# Patient Record
Sex: Female | Born: 1935 | Race: Black or African American | Hispanic: No | Marital: Married | Smoking: Never smoker
Health system: Southern US, Community
[De-identification: ages and names within clinical notes are randomized; demographics above are authoritative.]

## PROBLEM LIST (undated history)

## (undated) DIAGNOSIS — L259 Unspecified contact dermatitis, unspecified cause: Secondary | ICD-10-CM

## (undated) DIAGNOSIS — K222 Esophageal obstruction: Secondary | ICD-10-CM

## (undated) DIAGNOSIS — K5730 Diverticulosis of large intestine without perforation or abscess without bleeding: Secondary | ICD-10-CM

## (undated) DIAGNOSIS — E785 Hyperlipidemia, unspecified: Secondary | ICD-10-CM

## (undated) DIAGNOSIS — B351 Tinea unguium: Secondary | ICD-10-CM

## (undated) DIAGNOSIS — I10 Essential (primary) hypertension: Secondary | ICD-10-CM

## (undated) DIAGNOSIS — M109 Gout, unspecified: Secondary | ICD-10-CM

## (undated) DIAGNOSIS — Q613 Polycystic kidney, unspecified: Secondary | ICD-10-CM

## (undated) DIAGNOSIS — D126 Benign neoplasm of colon, unspecified: Secondary | ICD-10-CM

## (undated) DIAGNOSIS — K219 Gastro-esophageal reflux disease without esophagitis: Secondary | ICD-10-CM

## (undated) DIAGNOSIS — E559 Vitamin D deficiency, unspecified: Secondary | ICD-10-CM

## (undated) DIAGNOSIS — E538 Deficiency of other specified B group vitamins: Secondary | ICD-10-CM

## (undated) DIAGNOSIS — Z87442 Personal history of urinary calculi: Secondary | ICD-10-CM

## (undated) DIAGNOSIS — K449 Diaphragmatic hernia without obstruction or gangrene: Secondary | ICD-10-CM

## (undated) HISTORY — DX: Essential (primary) hypertension: I10

## (undated) HISTORY — DX: Diaphragmatic hernia without obstruction or gangrene: K44.9

## (undated) HISTORY — DX: Benign neoplasm of colon, unspecified: D12.6

## (undated) HISTORY — DX: Hyperlipidemia, unspecified: E78.5

## (undated) HISTORY — DX: Tinea unguium: B35.1

## (undated) HISTORY — PX: TONSILLECTOMY: SUR1361

## (undated) HISTORY — DX: Polycystic kidney, unspecified: Q61.3

## (undated) HISTORY — DX: Vitamin D deficiency, unspecified: E55.9

## (undated) HISTORY — DX: Esophageal obstruction: K22.2

## (undated) HISTORY — PX: COLONOSCOPY: SHX174

## (undated) HISTORY — DX: Gastro-esophageal reflux disease without esophagitis: K21.9

## (undated) HISTORY — DX: Deficiency of other specified B group vitamins: E53.8

## (undated) HISTORY — DX: Personal history of urinary calculi: Z87.442

## (undated) HISTORY — DX: Diverticulosis of large intestine without perforation or abscess without bleeding: K57.30

## (undated) HISTORY — DX: Unspecified contact dermatitis, unspecified cause: L25.9

---

## 1975-08-24 HISTORY — PX: DILATION AND CURETTAGE OF UTERUS: SHX78

## 1998-10-09 ENCOUNTER — Encounter: Payer: Self-pay | Admitting: Emergency Medicine

## 1998-10-09 ENCOUNTER — Emergency Department (HOSPITAL_COMMUNITY): Admission: EM | Admit: 1998-10-09 | Discharge: 1998-10-09 | Payer: Self-pay | Admitting: Emergency Medicine

## 1998-12-15 ENCOUNTER — Other Ambulatory Visit: Admission: RE | Admit: 1998-12-15 | Discharge: 1998-12-15 | Payer: Self-pay | Admitting: Obstetrics and Gynecology

## 1999-12-18 ENCOUNTER — Other Ambulatory Visit: Admission: RE | Admit: 1999-12-18 | Discharge: 1999-12-18 | Payer: Self-pay | Admitting: Obstetrics and Gynecology

## 2001-01-23 ENCOUNTER — Encounter: Payer: Self-pay | Admitting: Internal Medicine

## 2001-01-23 LAB — CONVERTED CEMR LAB

## 2001-02-21 ENCOUNTER — Other Ambulatory Visit: Admission: RE | Admit: 2001-02-21 | Discharge: 2001-02-21 | Payer: Self-pay | Admitting: Obstetrics and Gynecology

## 2001-08-07 ENCOUNTER — Encounter: Payer: Self-pay | Admitting: Internal Medicine

## 2002-04-03 ENCOUNTER — Other Ambulatory Visit: Admission: RE | Admit: 2002-04-03 | Discharge: 2002-04-03 | Payer: Self-pay | Admitting: Internal Medicine

## 2002-04-13 ENCOUNTER — Encounter: Payer: Self-pay | Admitting: Internal Medicine

## 2002-04-13 ENCOUNTER — Encounter: Admission: RE | Admit: 2002-04-13 | Discharge: 2002-04-13 | Payer: Self-pay | Admitting: Internal Medicine

## 2002-12-25 ENCOUNTER — Other Ambulatory Visit: Admission: RE | Admit: 2002-12-25 | Discharge: 2002-12-25 | Payer: Self-pay | Admitting: Obstetrics and Gynecology

## 2004-06-09 ENCOUNTER — Encounter: Admission: RE | Admit: 2004-06-09 | Discharge: 2004-06-09 | Payer: Self-pay | Admitting: Internal Medicine

## 2004-07-24 ENCOUNTER — Ambulatory Visit: Payer: Self-pay | Admitting: Internal Medicine

## 2004-11-18 ENCOUNTER — Ambulatory Visit: Payer: Self-pay | Admitting: Internal Medicine

## 2004-11-25 ENCOUNTER — Ambulatory Visit: Payer: Self-pay | Admitting: Internal Medicine

## 2005-01-01 ENCOUNTER — Other Ambulatory Visit: Admission: RE | Admit: 2005-01-01 | Discharge: 2005-01-01 | Payer: Self-pay | Admitting: Obstetrics and Gynecology

## 2005-03-09 ENCOUNTER — Ambulatory Visit: Payer: Self-pay | Admitting: Internal Medicine

## 2005-06-02 ENCOUNTER — Ambulatory Visit: Payer: Self-pay | Admitting: Internal Medicine

## 2005-06-09 ENCOUNTER — Ambulatory Visit: Payer: Self-pay | Admitting: Internal Medicine

## 2005-08-06 ENCOUNTER — Encounter: Admission: RE | Admit: 2005-08-06 | Discharge: 2005-08-06 | Payer: Self-pay | Admitting: Internal Medicine

## 2005-09-13 ENCOUNTER — Ambulatory Visit: Payer: Self-pay | Admitting: Internal Medicine

## 2005-09-29 ENCOUNTER — Ambulatory Visit: Payer: Self-pay | Admitting: Internal Medicine

## 2006-01-11 ENCOUNTER — Ambulatory Visit: Payer: Self-pay | Admitting: Internal Medicine

## 2006-04-08 ENCOUNTER — Ambulatory Visit: Payer: Self-pay | Admitting: Internal Medicine

## 2006-04-19 ENCOUNTER — Ambulatory Visit: Payer: Self-pay | Admitting: Internal Medicine

## 2006-07-20 ENCOUNTER — Ambulatory Visit: Payer: Self-pay | Admitting: Internal Medicine

## 2006-09-09 ENCOUNTER — Encounter: Admission: RE | Admit: 2006-09-09 | Discharge: 2006-09-09 | Payer: Self-pay | Admitting: Obstetrics and Gynecology

## 2006-12-07 ENCOUNTER — Ambulatory Visit: Payer: Self-pay | Admitting: Internal Medicine

## 2006-12-07 LAB — CONVERTED CEMR LAB
Bilirubin Urine: NEGATIVE
Chloride: 103 meq/L (ref 96–112)
GFR calc Af Amer: 70 mL/min
GFR calc non Af Amer: 58 mL/min
Glucose, Bld: 98 mg/dL (ref 70–99)
Hemoglobin, Urine: NEGATIVE
Ketones, ur: NEGATIVE mg/dL
Potassium: 3.9 meq/L (ref 3.5–5.1)
Sodium: 142 meq/L (ref 135–145)
Urine Glucose: NEGATIVE mg/dL
pH: 6 (ref 5.0–8.0)

## 2006-12-13 ENCOUNTER — Ambulatory Visit: Payer: Self-pay | Admitting: Cardiology

## 2007-01-03 ENCOUNTER — Ambulatory Visit: Payer: Self-pay | Admitting: Internal Medicine

## 2007-01-13 ENCOUNTER — Ambulatory Visit: Payer: Self-pay

## 2007-02-10 ENCOUNTER — Ambulatory Visit: Payer: Self-pay | Admitting: Internal Medicine

## 2007-03-17 ENCOUNTER — Ambulatory Visit: Payer: Self-pay | Admitting: Internal Medicine

## 2007-03-17 LAB — CONVERTED CEMR LAB
ALT: 20 units/L (ref 0–35)
Amylase: 86 units/L (ref 27–131)
Basophils Absolute: 0 10*3/uL (ref 0.0–0.1)
Bilirubin, Direct: 0.1 mg/dL (ref 0.0–0.3)
Eosinophils Absolute: 0.2 10*3/uL (ref 0.0–0.6)
HCT: 38.4 % (ref 36.0–46.0)
MCHC: 35.6 g/dL (ref 30.0–36.0)
MCV: 91.3 fL (ref 78.0–100.0)
Neutrophils Relative %: 48.6 % (ref 43.0–77.0)
RBC: 4.21 M/uL (ref 3.87–5.11)

## 2007-03-20 ENCOUNTER — Encounter: Payer: Self-pay | Admitting: Internal Medicine

## 2007-03-20 DIAGNOSIS — K5730 Diverticulosis of large intestine without perforation or abscess without bleeding: Secondary | ICD-10-CM | POA: Insufficient documentation

## 2007-03-20 DIAGNOSIS — E785 Hyperlipidemia, unspecified: Secondary | ICD-10-CM

## 2007-03-20 DIAGNOSIS — I10 Essential (primary) hypertension: Secondary | ICD-10-CM

## 2007-03-20 DIAGNOSIS — E559 Vitamin D deficiency, unspecified: Secondary | ICD-10-CM

## 2007-03-21 ENCOUNTER — Ambulatory Visit: Payer: Self-pay | Admitting: Gastroenterology

## 2007-04-13 ENCOUNTER — Ambulatory Visit: Payer: Self-pay | Admitting: Internal Medicine

## 2007-06-21 ENCOUNTER — Ambulatory Visit: Payer: Self-pay | Admitting: Internal Medicine

## 2007-08-09 ENCOUNTER — Ambulatory Visit: Payer: Self-pay | Admitting: Internal Medicine

## 2007-08-09 ENCOUNTER — Encounter: Payer: Self-pay | Admitting: Internal Medicine

## 2007-08-09 DIAGNOSIS — K219 Gastro-esophageal reflux disease without esophagitis: Secondary | ICD-10-CM | POA: Insufficient documentation

## 2007-08-24 DIAGNOSIS — E538 Deficiency of other specified B group vitamins: Secondary | ICD-10-CM

## 2007-08-24 HISTORY — DX: Deficiency of other specified B group vitamins: E53.8

## 2007-09-22 ENCOUNTER — Ambulatory Visit: Payer: Self-pay | Admitting: Internal Medicine

## 2007-09-22 DIAGNOSIS — R209 Unspecified disturbances of skin sensation: Secondary | ICD-10-CM

## 2007-11-28 ENCOUNTER — Ambulatory Visit: Payer: Self-pay | Admitting: Internal Medicine

## 2007-11-28 LAB — CONVERTED CEMR LAB
CO2: 31 meq/L (ref 19–32)
Calcium: 9.6 mg/dL (ref 8.4–10.5)
GFR calc Af Amer: 63 mL/min
GFR calc non Af Amer: 52 mL/min
Sodium: 139 meq/L (ref 135–145)

## 2007-12-05 DIAGNOSIS — K222 Esophageal obstruction: Secondary | ICD-10-CM

## 2007-12-05 DIAGNOSIS — Z87442 Personal history of urinary calculi: Secondary | ICD-10-CM

## 2007-12-05 DIAGNOSIS — K449 Diaphragmatic hernia without obstruction or gangrene: Secondary | ICD-10-CM | POA: Insufficient documentation

## 2007-12-05 DIAGNOSIS — S82899A Other fracture of unspecified lower leg, initial encounter for closed fracture: Secondary | ICD-10-CM | POA: Insufficient documentation

## 2007-12-07 ENCOUNTER — Ambulatory Visit: Payer: Self-pay | Admitting: Internal Medicine

## 2008-04-11 ENCOUNTER — Ambulatory Visit: Payer: Self-pay | Admitting: Internal Medicine

## 2008-04-11 LAB — CONVERTED CEMR LAB
Calcium: 9.3 mg/dL (ref 8.4–10.5)
Creatinine, Ser: 1.3 mg/dL — ABNORMAL HIGH (ref 0.4–1.2)
Folate: 7.6 ng/mL
GFR calc Af Amer: 52 mL/min
GFR calc non Af Amer: 43 mL/min
Vitamin B-12: 255 pg/mL (ref 211–911)

## 2008-04-15 ENCOUNTER — Ambulatory Visit: Payer: Self-pay | Admitting: Internal Medicine

## 2008-04-15 DIAGNOSIS — E538 Deficiency of other specified B group vitamins: Secondary | ICD-10-CM

## 2008-06-11 ENCOUNTER — Encounter: Payer: Self-pay | Admitting: Internal Medicine

## 2008-06-21 ENCOUNTER — Ambulatory Visit: Payer: Self-pay | Admitting: Internal Medicine

## 2008-08-05 ENCOUNTER — Ambulatory Visit: Payer: Self-pay | Admitting: Internal Medicine

## 2008-08-05 LAB — CONVERTED CEMR LAB
ALT: 18 units/L (ref 0–35)
AST: 18 units/L (ref 0–37)
Albumin: 3.6 g/dL (ref 3.5–5.2)
BUN: 13 mg/dL (ref 6–23)
CO2: 31 meq/L (ref 19–32)
Calcium: 9.4 mg/dL (ref 8.4–10.5)
Chloride: 102 meq/L (ref 96–112)
Cholesterol: 180 mg/dL (ref 0–200)
Creatinine, Ser: 1.1 mg/dL (ref 0.4–1.2)
Glucose, Bld: 102 mg/dL — ABNORMAL HIGH (ref 70–99)
HDL: 62.7 mg/dL (ref >39.0)
Total CHOL/HDL Ratio: 2.9
Total Protein: 6.9 g/dL (ref 6.0–8.3)
Triglycerides: 85 mg/dL (ref 0–149)
Vitamin B-12: 325 pg/mL (ref 211–911)

## 2008-08-07 ENCOUNTER — Telehealth: Payer: Self-pay | Admitting: Internal Medicine

## 2008-08-08 ENCOUNTER — Encounter (INDEPENDENT_AMBULATORY_CARE_PROVIDER_SITE_OTHER): Payer: Self-pay | Admitting: *Deleted

## 2008-08-08 ENCOUNTER — Ambulatory Visit: Payer: Self-pay | Admitting: Internal Medicine

## 2008-08-27 ENCOUNTER — Ambulatory Visit: Payer: Self-pay | Admitting: Internal Medicine

## 2008-08-27 DIAGNOSIS — R05 Cough: Secondary | ICD-10-CM

## 2008-08-27 DIAGNOSIS — J189 Pneumonia, unspecified organism: Secondary | ICD-10-CM | POA: Insufficient documentation

## 2008-09-10 ENCOUNTER — Ambulatory Visit: Payer: Self-pay | Admitting: Internal Medicine

## 2008-09-10 DIAGNOSIS — R1319 Other dysphagia: Secondary | ICD-10-CM | POA: Insufficient documentation

## 2008-12-03 ENCOUNTER — Encounter: Admission: RE | Admit: 2008-12-03 | Discharge: 2008-12-03 | Payer: Self-pay | Admitting: Obstetrics and Gynecology

## 2008-12-06 ENCOUNTER — Ambulatory Visit: Payer: Self-pay | Admitting: Internal Medicine

## 2009-06-06 ENCOUNTER — Ambulatory Visit: Payer: Self-pay | Admitting: Internal Medicine

## 2009-06-06 LAB — CONVERTED CEMR LAB
Alkaline Phosphatase: 64 units/L (ref 39–117)
Bilirubin Urine: NEGATIVE
Bilirubin, Direct: 0.1 mg/dL (ref 0.0–0.3)
Calcium: 9.3 mg/dL (ref 8.4–10.5)
Creatinine, Ser: 1.1 mg/dL (ref 0.4–1.2)
GFR calc non Af Amer: 62.53 mL/min (ref >60)
HDL: 48.5 mg/dL (ref >39.00)
Ketones, ur: NEGATIVE mg/dL
Leukocytes, UA: NEGATIVE
Sodium: 139 meq/L (ref 135–145)
Specific Gravity, Urine: 1.015 (ref 1.000–1.030)
TSH: 1.65 microintl units/mL (ref 0.35–5.50)
Total CHOL/HDL Ratio: 4
Total Protein: 7 g/dL (ref 6.0–8.3)
Triglycerides: 176 mg/dL — ABNORMAL HIGH (ref 0.0–149.0)
Urine Glucose: NEGATIVE mg/dL
VLDL: 35.2 mg/dL (ref 0.0–40.0)
pH: 6 (ref 5.0–8.0)

## 2009-06-07 ENCOUNTER — Ambulatory Visit: Payer: Self-pay | Admitting: Family Medicine

## 2009-06-07 DIAGNOSIS — L259 Unspecified contact dermatitis, unspecified cause: Secondary | ICD-10-CM

## 2009-06-10 ENCOUNTER — Ambulatory Visit: Payer: Self-pay | Admitting: Internal Medicine

## 2009-06-10 DIAGNOSIS — B351 Tinea unguium: Secondary | ICD-10-CM | POA: Insufficient documentation

## 2009-07-11 ENCOUNTER — Ambulatory Visit: Payer: Self-pay | Admitting: Internal Medicine

## 2009-08-16 ENCOUNTER — Inpatient Hospital Stay (HOSPITAL_COMMUNITY): Admission: EM | Admit: 2009-08-16 | Discharge: 2009-08-17 | Payer: Self-pay | Admitting: Emergency Medicine

## 2009-11-06 ENCOUNTER — Ambulatory Visit: Payer: Self-pay | Admitting: Internal Medicine

## 2009-11-06 LAB — CONVERTED CEMR LAB
Alkaline Phosphatase: 53 units/L (ref 39–117)
BUN: 17 mg/dL (ref 6–23)
Bilirubin, Direct: 0.1 mg/dL (ref 0.0–0.3)
Chloride: 102 meq/L (ref 96–112)
Creatinine, Ser: 1.2 mg/dL (ref 0.4–1.2)
Glucose, Bld: 101 mg/dL — ABNORMAL HIGH (ref 70–99)
Ketones, ur: NEGATIVE mg/dL
LDL Cholesterol: 82 mg/dL (ref 0–99)
Potassium: 3.8 meq/L (ref 3.5–5.1)
Specific Gravity, Urine: 1.02 (ref 1.000–1.030)
Total Bilirubin: 0.7 mg/dL (ref 0.3–1.2)
Total CHOL/HDL Ratio: 3
Total Protein, Urine: NEGATIVE mg/dL
Urine Glucose: NEGATIVE mg/dL
Urobilinogen, UA: 0.2 (ref 0.0–1.0)
VLDL: 27.6 mg/dL (ref 0.0–40.0)
pH: 6 (ref 5.0–8.0)

## 2009-11-12 ENCOUNTER — Ambulatory Visit: Payer: Self-pay | Admitting: Internal Medicine

## 2009-11-13 ENCOUNTER — Encounter (INDEPENDENT_AMBULATORY_CARE_PROVIDER_SITE_OTHER): Payer: Self-pay | Admitting: *Deleted

## 2009-12-09 ENCOUNTER — Encounter (INDEPENDENT_AMBULATORY_CARE_PROVIDER_SITE_OTHER): Payer: Self-pay | Admitting: *Deleted

## 2009-12-11 ENCOUNTER — Ambulatory Visit: Payer: Self-pay | Admitting: Internal Medicine

## 2009-12-25 ENCOUNTER — Ambulatory Visit: Payer: Self-pay | Admitting: Internal Medicine

## 2009-12-25 LAB — HM COLONOSCOPY

## 2009-12-26 ENCOUNTER — Encounter: Payer: Self-pay | Admitting: Internal Medicine

## 2010-02-24 ENCOUNTER — Ambulatory Visit: Payer: Self-pay | Admitting: Internal Medicine

## 2010-02-24 DIAGNOSIS — T23019A Burn of unspecified degree of unspecified thumb (nail), initial encounter: Secondary | ICD-10-CM | POA: Insufficient documentation

## 2010-05-07 ENCOUNTER — Ambulatory Visit: Payer: Self-pay | Admitting: Internal Medicine

## 2010-05-07 LAB — CONVERTED CEMR LAB
CO2: 30 meq/L (ref 19–32)
Chloride: 105 meq/L (ref 96–112)
GFR calc non Af Amer: 68.05 mL/min (ref >60)
Glucose, Bld: 89 mg/dL (ref 70–99)
Potassium: 3.9 meq/L (ref 3.5–5.1)
Sodium: 141 meq/L (ref 135–145)

## 2010-05-14 ENCOUNTER — Ambulatory Visit: Payer: Self-pay | Admitting: Internal Medicine

## 2010-08-23 HISTORY — PX: TOE SURGERY: SHX1073

## 2010-09-14 ENCOUNTER — Emergency Department (HOSPITAL_COMMUNITY)
Admission: EM | Admit: 2010-09-14 | Discharge: 2010-09-14 | Payer: Self-pay | Source: Home / Self Care | Admitting: Emergency Medicine

## 2010-09-22 NOTE — Assessment & Plan Note (Signed)
Summary: 6 MO ROV /NWS  #   Vital Signs:  Patient profile:   77 year old female Height:      67 inches Weight:      218 pounds BMI:     34.27 Temp:     98.4 degrees F oral Pulse rate:   72 / minute Pulse rhythm:   regular Resp:     16 per minute BP sitting:   120 / 74  (left arm) Cuff size:   regular  Vitals Entered By: Lanier Prude, CMA(AAMA) (May 14, 2010 9:31 AM) CC: 6 mo f/u Is Patient Diabetic? No   Primary Care Provider:  Jacinta Shoe, MD  CC:  6 mo f/u.  History of Present Illness: The patient presents for a follow up of hypertension, GERD, hyperlipidemia   Preventive Screening-Counseling & Management  Alcohol-Tobacco     Smoking Status: never  Current Medications (verified): 1)  Lovastatin 40 Mg  Tabs (Lovastatin) .Marland Kitchen.. 1 Once Daily 2)  Triamterene-Hctz 37.5-25 Mg  Caps (Triamterene-Hctz) .... Once Daily 3)  Metoprolol Tartrate 50 Mg  Tabs (Metoprolol Tartrate) .Marland Kitchen.. 1 By Mouth Bid 4)  Vitamin D3 1000 Unit  Tabs (Cholecalciferol) .Marland Kitchen.. 1 By Mouth Daily 5)  Miralax   Powd (Polyethylene Glycol 3350) .... As Needed 6)  Vitamin B-12 Cr 1000 Mcg  Tbcr (Cyanocobalamin) .... Take 1/2 Tab By Mouth Daily 7)  Ranitidine Hcl 150 Mg Caps (Ranitidine Hcl) .Marland Kitchen.. 1 Bid 8)  Omeprazole 20 Mg Cpdr (Omeprazole) .... One By Mouth Daily 9)  Silver Sulfadiazine 1 % Crea (Silver Sulfadiazine) .... Apply To Affected Skin Two Times A Day As Needed  Allergies (verified): 1)  ! Sulfa 2)  ! Biaxin 3)  ! Penicillin  Past History:  Past Medical History: Last updated: 02/24/2010 Diverticulosis, colon Hyperlipidemia  Hypertension GERD w/strict Dr Marina Goodell Low Vit B12 level, borderline 2009 GYN Dr Henderson Cloud   Past Surgical History: Last updated: 12/07/2007 tonsillectomy D&C 1977  Social History: Last updated: 09/10/2008 Retired Married Never Smoked Regular exercise-no Alcohol Use - no  Review of Systems  The patient denies fever, chest pain, syncope, abdominal  pain, and melena.    Physical Exam  General:  alert, well-developed, overweight-appearing.   Ears:  R and L ears are normal, no deformities or erythema Nose:  no lesions Mouth:  no lesions Lungs:  normal respiratory effort, no intercostal retractions or use of accessory muscles; normal breath sounds bilaterally - no crackles and no wheezes.    Heart:  normal rate, regular rhythm, no murmur, and no rub. BLE without edema.  Abdomen:  S/NT Msk:  No deformity or scoliosis noted of thoracic or lumbar spine.   Extremities:  No clubbing, cyanosis, edema, or deformity noted with normal full range of motion of all joints.   Neurologic:  WNL Skin:  left thumb with erythema from [facility] distal involving extensor surface - blister from nail bed to PIP (intact) Psych:  Cognition and judgment appear intact. Alert and cooperative with normal attention span and concentration. No apparent delusions, illusions, hallucinations   Impression & Recommendations:  Problem # 1:  HYPERTENSION (ICD-401.9) Assessment Improved The labs were reviewed with the patient.  Her updated medication list for this problem includes:    Triamterene-hctz 37.5-25 Mg Caps (Triamterene-hctz) ..... Once daily    Metoprolol Tartrate 50 Mg Tabs (Metoprolol tartrate) .Marland Kitchen... 1 by mouth bid  BP today: 120/74 Prior BP: 138/80 (02/24/2010)  Labs Reviewed: K+: 3.9 (05/07/2010) Creat: : 1.0 (05/07/2010)   Chol:  167 (11/06/2009)   HDL: 57.00 (11/06/2009)   LDL: 82 (11/06/2009)   TG: 138.0 (11/06/2009)  Problem # 2:  B12 DEFICIENCY (ICD-266.2) Assessment: Unchanged On the regimen of medicine(s) reflected in the chart    Problem # 3:  DEFICIENCY, VITAMIN D NOS (ICD-268.9) Assessment: Unchanged On the regimen of medicine(s) reflected in the chart    Problem # 4:  HYPERLIPIDEMIA (ICD-272.4) Assessment: Unchanged  Her updated medication list for this problem includes:    Lovastatin 40 Mg Tabs (Lovastatin) .Marland Kitchen... 1 once daily  Complete  Medication List: 1)  Lovastatin 40 Mg Tabs (Lovastatin) .Marland Kitchen.. 1 once daily 2)  Triamterene-hctz 37.5-25 Mg Caps (Triamterene-hctz) .... Once daily 3)  Metoprolol Tartrate 50 Mg Tabs (Metoprolol tartrate) .Marland Kitchen.. 1 by mouth bid 4)  Vitamin D3 1000 Unit Tabs (Cholecalciferol) .Marland Kitchen.. 1 by mouth daily 5)  Miralax Powd (Polyethylene glycol 3350) .... As needed 6)  Vitamin B-12 Cr 1000 Mcg Tbcr (Cyanocobalamin) .... Take 1/2 tab by mouth daily 7)  Ranitidine Hcl 150 Mg Caps (Ranitidine hcl) .Marland Kitchen.. 1 bid 8)  Omeprazole 20 Mg Cpdr (Omeprazole) .... One by mouth daily 9)  Silver Sulfadiazine 1 % Crea (Silver sulfadiazine) .... Apply to affected skin two times a day as needed  Patient Instructions: 1)  Please schedule a follow-up appointment in 6 months well w/labs. 2)  Vit B12 266.20 3)  Vit d 268.9 Prescriptions: OMEPRAZOLE 20 MG CPDR (OMEPRAZOLE) one by mouth daily  #90 x 3   Entered and Authorized by:   Tresa Garter MD   Signed by:   Tresa Garter MD on 05/14/2010   Method used:   Print then Give to Patient   RxID:   [id number] RANITIDINE HCL 150 MG CAPS (RANITIDINE HCL) 1 bid  #180 x 3   Entered and Authorized by:   Tresa Garter MD   Signed by:   Tresa Garter MD on 05/14/2010   Method used:   Print then Give to Patient   RxID:   [id number] METOPROLOL TARTRATE 50 MG  TABS (METOPROLOL TARTRATE) 1 by mouth bid  #180 x 3   Entered and Authorized by:   Tresa Garter MD   Signed by:   Tresa Garter MD on 05/14/2010   Method used:   Print then Give to Patient   RxID:   [id number] TRIAMTERENE-HCTZ 37.5-25 MG  CAPS (TRIAMTERENE-HCTZ) once daily  #90 x 3   Entered and Authorized by:   Tresa Garter MD   Signed by:   Tresa Garter MD on 05/14/2010   Method used:   Print then Give to Patient   RxID:   [id number] LOVASTATIN 40 MG  TABS (LOVASTATIN) 1 once daily  #90 x 3   Entered and Authorized by:   Tresa Garter MD    Signed by:   Tresa Garter MD on 05/14/2010   Method used:   Print then Give to Patient   RxID:   [id number]

## 2010-09-22 NOTE — Miscellaneous (Signed)
Summary: Doctor, general practice [facility]   Imported By: Lester Whitewater 11/20/2009 11:56:10  _____________________________________________________________________  External Attachment:    Type:   Image     Comment:   External Document

## 2010-09-22 NOTE — Letter (Signed)
Summary: Previsit letter  [facility]  [address] [place], [place] [address]   Phone: [phone]  Fax: [phone]       11/13/2009 MRN: [id number]  Jasmin [name] [address] [place], [place]  [address]  Dear Ms. Armendariz,  Welcome to the Gastroenterology Division at [facility].    You are scheduled to see a nurse for your pre-procedure visit on 12-11-09 at 10:30a.m. on the 3rd floor at [facility], [address]  We ask that you try to arrive at our office 15 minutes prior to your appointment time to allow for check-in.  Your nurse visit will consist of discussing your medical and surgical history, your immediate family medical history, and your medications.    Please bring a complete list of all your medications or, if you prefer, bring the medication bottles and we will list them.  We will need to be aware of both prescribed and over the counter drugs.  We will need to know exact dosage information as well.  If you are on blood thinners (Coumadin, Plavix, Aggrenox, Ticlid, etc.) please call our office today/prior to your appointment, as we need to consult with your physician about holding your medication.   Please be prepared to read and sign documents such as consent forms, a financial agreement, and acknowledgement forms.  If necessary, and with your consent, a friend or relative is welcome to sit-in on the nurse visit with you.  Please bring your insurance card so that we may make a copy of it.  If your insurance requires a referral to see a specialist, please bring your referral form from your primary care physician.  No co-pay is required for this nurse visit.     If you cannot keep your appointment, please call [phone] to cancel or reschedule prior to your appointment date.  This allows [place] the opportunity to schedule an appointment for another patient in need of care.    Thank you for choosing [facility] for your medical  needs.  We appreciate the opportunity to care for you.  Please visit [place] at our website  to learn more about our practice.                     Sincerely.                                                                                                                   The Gastroenterology Division

## 2010-09-22 NOTE — Letter (Signed)
Summary: Patient Notice- Polyp Results  [facility]  [address] [place], [place] [address]   Phone: [phone]  Fax: [phone]        Dec 26, 2009 MRN: [id number]    Eunice GIANINO [address] [place], [place]  [address]    Dear Ms. Behrman,  I am pleased to inform you that the colon polyp(s) removed during your recent colonoscopy was (were) found to be benign (no cancer detected) upon pathologic examination.  I recommend you have a repeat colonoscopy examination in ONE years to look for recurrent polyps, as having colon polyps increases your risk for having recurrent polyps or even colon cancer in the future.  Should you develop new or worsening symptoms of abdominal pain, bowel habit changes or bleeding from the rectum or bowels, please schedule an evaluation with either your primary care physician or with me.  Additional information/recommendations:  __ No further action with gastroenterology is needed at this time. Please      follow-up with your primary care physician for your other healthcare      needs.   Please call us if you are having persistent problems or have questions about your condition that have not been fully answered at this time.  Sincerely,  Hilarie Fredrickson MD  This letter has been electronically signed by your physician.  Appended Document: Patient Notice- Polyp Results letter mailed 4.9.11.
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
## 2010-09-22 NOTE — Procedures (Signed)
Summary: Colonoscopy  Patient: Jasmin [name] Note: All result statuses are Final unless otherwise noted.  Tests: (1) Colonoscopy (COL)   COL Colonoscopy           DONE     [facility]     [address]     [place], [place]  [address]           COLONOSCOPY PROCEDURE REPORT           PATIENT:  [name], [name]  MR#:  [id number]     BIRTHDATE:  08/05/1934, 76 yrs. old  GENDER:  female     ENDOSCOPIST:  Wilhemina Bonito. Eda Keys, MD     REF. BY:  Dr. Posey Rea ; Screening Recall     PROCEDURE DATE:  12/25/2009     PROCEDURE:  Colonoscopy with snare polypectomy x 10     ASA CLASS:  Class II     INDICATIONS:  Routine Risk Screening ; normal index exam 2002     MEDICATIONS:   Fentanyl 75 mcg IV, Versed 8 mg IV           DESCRIPTION OF PROCEDURE:   After the risks benefits and     alternatives of the procedure were thoroughly explained, informed     consent was obtained.  Digital rectal exam was performed and     revealed no abnormalities.   The LB [id number] endoscope     was introduced through the anus and advanced to the cecum, which     was identified by both the appendix and ileocecal valve, without     limitations.Time to cecum = 2:15 min. The quality of the prep was     excellent, using MoviPrep.  The instrument was then slowly     withdrawn (time = 15:58 min) as the colon was fully examined.     <<PROCEDUREIMAGES>>           FINDINGS:  There were multiple polyps identified and removed in     the cecum (3mm), ascending colon (1,2,3,4,5,5,44mm) and transverse     colon (32mm,4mm). Polyps were snared without cautery. Retrieval was     successful.  Moderate diverticulosis was found in the left colon.     This was otherwise a normal examination of the colon.   Retroflexed     views in the rectum revealed internal hemorrhoids.    The scope     was then withdrawn from the patient and the procedure completed.           COMPLICATIONS:  None     ENDOSCOPIC IMPRESSION:  1) Polyps, multiple (10) -removed     2) Moderate diverticulosis in the left colon     3) Otherwise normal examination     4) Internal hemorrhoids           RECOMMENDATIONS:     1) Follow up colonoscopy in ONE year           ______________________________     Wilhemina Bonito. Eda Keys, MD           CC:  Linda Hedges. Plotnikov, MD; The Patient           n.     eSIGNED:   Wilhemina Bonito. Eda Keys at 12/25/2009 10:56 AM           Marcello Fennel, [id number]  Note: An exclamation mark (!) indicates a result that was not dispersed into the flowsheet. Document Creation Date: 12/25/2009 10:57 AM _______________________________________________________________________  (  1) Order result status: Final Collection or observation date-time: 12/25/2009 10:44 Requested date-time:  Receipt date-time:  Reported date-time:  Referring Physician:   Ordering Physician: Fransico Setters [phone]) Specimen Source:  Source: Launa Grill Order Number: [id number] Lab site:   Appended Document: Colonoscopy recall in ONE year     Procedures Next Due Date:    Colonoscopy: 12/2010

## 2010-09-22 NOTE — Assessment & Plan Note (Signed)
Summary: 4 MO ROV /NWS  #   Vital Signs:  Patient profile:   77 year old female Weight:      221 pounds BMI:     34.74 Temp:     97.6 degrees F oral Pulse rate:   68 / minute BP sitting:   124 / 80  (left arm)  Vitals Entered By: Tora Perches (November 12, 2009 7:53 AM) CC: f/u Is Patient Diabetic? No   Primary Care Provider:  Jacinta Shoe, MD  CC:  f/u.  History of Present Illness:  The patient presents for a follow up of hypertension, GERD, hyperlipidemia The patient presents for a wellness examination    Preventive Screening-Counseling & Management  Alcohol-Tobacco     Smoking Status: never  Current Medications (verified): 1)  Lovastatin 40 Mg  Tabs (Lovastatin) .Marland Kitchen.. 1 Once Daily 2)  Triamterene-Hctz 37.5-25 Mg  Caps (Triamterene-Hctz) .... Once Daily 3)  Metoprolol Tartrate 50 Mg  Tabs (Metoprolol Tartrate) .Marland Kitchen.. 1 By Mouth Bid 4)  Vitamin D3 1000 Unit  Tabs (Cholecalciferol) .Marland Kitchen.. 1 By Mouth Daily 5)  Miralax   Powd (Polyethylene Glycol 3350) .... As Needed 6)  Vitamin B-12 Cr 1000 Mcg  Tbcr (Cyanocobalamin) .... Take 1/2 Tab By Mouth Daily 7)  Ranitidine Hcl 150 Mg Caps (Ranitidine Hcl) .Marland Kitchen.. 1 Bid 8)  Omeprazole 20 Mg Cpdr (Omeprazole) .... One By Mouth Daily 9)  Clobetasol Propionate 0.05 % Crea (Clobetasol Propionate) .... Apply To Area Two Times A Day As Needed 10)  Penlac 8 % Soln (Ciclopirox) .... As Dirr Qd  Allergies: 1)  ! Sulfa 2)  ! Biaxin 3)  ! Penicillin  Past History:  Past Surgical History: Last updated: 12/07/2007 tonsillectomy D&C 1977  Family History: Last updated: 08/09/2007 Family History Hypertension  Social History: Last updated: 09/10/2008 Retired Married Never Smoked Regular exercise-no Alcohol Use - no  Past Medical History: Diverticulosis, colon Hyperlipidemia Hypertension GERD w/strict Dr Marina Goodell Low Vit B12 level, borderline 2009 GYN Dr Henderson Cloud  Review of Systems       The patient complains of weight gain.   The patient denies anorexia, fever, weight loss, vision loss, decreased hearing, hoarseness, chest pain, syncope, dyspnea on exertion, peripheral edema, prolonged cough, headaches, hemoptysis, abdominal pain, melena, hematochezia, hematuria, incontinence, genital sores, muscle weakness, suspicious skin lesions, transient blindness, difficulty walking, depression, unusual weight change, abnormal bleeding, enlarged lymph nodes, angioedema, breast masses, and testicular masses.    Physical Exam  General:  alert, cooperative, well-appearing Eyes:  PERRLA, no icterus. Ears:  R and L ears are normal, no deformities or erythema Nose:  no lesions Mouth:  no lesions Lungs:  CTA Heart:  RRR Abdomen:  S/NT Msk:  No deformity or scoliosis noted of thoracic or lumbar spine.   Extremities:  No clubbing, cyanosis, edema, or deformity noted with normal full range of motion of all joints.   Neurologic:  WNL Skin:  small 1-71mm paules with minimal erythema and fairly individual on left upper chest, bilat wrists. B big toenails w/onycho   Impression & Recommendations:  Problem # 1:  B12 DEFICIENCY (ICD-266.2) Assessment Improved On prescription therapy   Problem # 2:  GERD (ICD-530.81) Assessment: Unchanged  Her updated medication list for this problem includes:    Ranitidine Hcl 150 Mg Caps (Ranitidine hcl) .Marland Kitchen... 1 bid    Omeprazole 20 Mg Cpdr (Omeprazole) ..... One by mouth daily  Problem # 3:  HYPERTENSION (ICD-401.9) Assessment: Improved  Her updated medication list for this problem includes:  Triamterene-hctz 37.5-25 Mg Caps (Triamterene-hctz) ..... Once daily    Metoprolol Tartrate 50 Mg Tabs (Metoprolol tartrate) .Marland Kitchen... 1 by mouth bid  BP today: 124/80 Prior BP: 136/80 (07/11/2009)  Labs Reviewed: K+: 3.8 (11/06/2009) Creat: : 1.2 (11/06/2009)   Chol: 167 (11/06/2009)   HDL: 57.00 (11/06/2009)   LDL: 82 (11/06/2009)   TG: 138.0 (11/06/2009)  Problem # 4:  HYPERLIPIDEMIA  (ICD-272.4) Assessment: Improved  Her updated medication list for this problem includes:    Lovastatin 40 Mg Tabs (Lovastatin) .Marland Kitchen... 1 once daily  Problem # 5:  PHYSICAL EXAMINATION (ICD-V70.0) Assessment: New  Health and age related issues were discussed. Available screening tests and vaccinations were discussed as well. Healthy life style including good diet and execise was discussed. The labs were reviewed with the patient. GYN q 12 months  Orders: First annual wellness visit with prevention plan  [phone]) Gastroenterology Referral (GI)  Complete Medication List: 1)  Lovastatin 40 Mg Tabs (Lovastatin) .Marland Kitchen.. 1 once daily 2)  Triamterene-hctz 37.5-25 Mg Caps (Triamterene-hctz) .... Once daily 3)  Metoprolol Tartrate 50 Mg Tabs (Metoprolol tartrate) .Marland Kitchen.. 1 by mouth bid 4)  Vitamin D3 1000 Unit Tabs (Cholecalciferol) .Marland Kitchen.. 1 by mouth daily 5)  Miralax Powd (Polyethylene glycol 3350) .... As needed 6)  Vitamin B-12 Cr 1000 Mcg Tbcr (Cyanocobalamin) .... Take 1/2 tab by mouth daily 7)  Ranitidine Hcl 150 Mg Caps (Ranitidine hcl) .Marland Kitchen.. 1 bid 8)  Omeprazole 20 Mg Cpdr (Omeprazole) .... One by mouth daily 9)  Clobetasol Propionate 0.05 % Crea (Clobetasol propionate) .... Apply to area two times a day as needed 10)  Penlac 8 % Soln (Ciclopirox) .... As dirr qd  Other Orders: Pneumococcal Vaccine (98119) Admin 1st Vaccine (14782) Admin 1st Vaccine [facility]) [phone])  Patient Instructions: 1)  Please schedule a follow-up appointment in 6 months. 2)  BMP prior to visit, ICD-9: 3)  TSH prior to visit, ICD-9:401.1   Pneumovax Vaccine    Vaccine Type: Pneumovax    Site: left deltoid    Mfr: Merck    Dose: 0.5 ml    Route: IM    Given by: Tora Perches    Exp. Date: 04/06/2011    Lot #: [id number]    VIS given: 03/20/96 version given November 12, 2009.

## 2010-09-22 NOTE — Miscellaneous (Signed)
Summary: [facility] previsit  Clinical Lists Changes  Medications: Added new medication of MOVIPREP 100 GM  SOLR (PEG-KCL-NACL-NASULF-NA ASC-C) As per prep instructions. - Signed Rx of MOVIPREP 100 GM  SOLR (PEG-KCL-NACL-NASULF-NA ASC-C) As per prep instructions.;  #1 x 0;  Signed;  Entered by: Karl Bales RN;  Authorized by: Hilarie Fredrickson MD;  Method used: Electronically to [facility] [address] [phone]*, [address] [place], [place], [place]  [id number], Ph: [phone] or [phone], Fax: [phone]    Prescriptions: MOVIPREP 100 GM  SOLR (PEG-KCL-NACL-NASULF-NA ASC-C) As per prep instructions.  #1 x 0   Entered by:   Karl Bales RN   Authorized by:   Hilarie Fredrickson MD   Signed by:   Karl Bales RN on 12/11/2009   Method used:   [facility] to        [facility]  [address] [phone]* (retail)       [address]       [place], [place]  [id number]       Ph: [phone] or [phone]       Fax: [phone]   RxID:   [id number]

## 2010-09-22 NOTE — Letter (Signed)
Summary: [facility] Instructions  [facility]  [address] [place], [place] [address]   Phone: [phone]  Fax: [phone]       Jasmin [name]    August 23, 1934    MRN: [id number]        Procedure Day /Date:  Thursday 12/25/2009     Arrival Time: 8:30 am      Procedure Time: 9:30 am     Location of Procedure:                    _ x_  [facility] (4th Floor)                        PREPARATION FOR COLONOSCOPY WITH MOVIPREP   Starting 5 days prior to your procedure Saturday 4/30 do not eat nuts, seeds, popcorn, corn, beans, peas,  salads, or any raw vegetables.  Do not take any fiber supplements (e.g. Metamucil, Citrucel, and Benefiber).  THE DAY BEFORE YOUR PROCEDURE         DATE: Wednesday 5/4  1.  Drink clear liquids the entire day-NO SOLID FOOD  2.  Do not drink anything colored red or purple.  Avoid juices with pulp.  No orange juice.  3.  Drink at least 64 oz. (8 glasses) of fluid/clear liquids during the day to prevent dehydration and help the prep work efficiently.  CLEAR LIQUIDS INCLUDE: Water Jello Ice Popsicles Tea (sugar ok, no milk/cream) Powdered fruit flavored drinks Coffee (sugar ok, no milk/cream) Gatorade Juice: apple, white grape, white cranberry  Lemonade Clear bullion, consomm, broth Carbonated beverages (any kind) Strained chicken noodle soup Hard Candy                             4.  In the morning, mix first dose of MoviPrep solution:    Empty 1 Pouch A and 1 Pouch B into the disposable container    Add lukewarm drinking water to the top line of the container. Mix to dissolve    Refrigerate (mixed solution should be used within 24 hrs)  5.  Begin drinking the prep at 5:00 p.m. The MoviPrep container is divided by 4 marks.   Every 15 minutes drink the solution down to the next mark (approximately 8 oz) until the full liter is complete.   6.  Follow completed prep with 16 oz of clear liquid of your choice  (Nothing red or purple).  Continue to drink clear liquids until bedtime.  7.  Before going to bed, mix second dose of MoviPrep solution:    Empty 1 Pouch A and 1 Pouch B into the disposable container    Add lukewarm drinking water to the top line of the container. Mix to dissolve    Refrigerate  THE DAY OF YOUR PROCEDURE      DATE: Thursday 5/5  Beginning at 4:30 a.m. (5 hours before procedure):         1. Every 15 minutes, drink the solution down to the next mark (approx 8 oz) until the full liter is complete.  2. Follow completed prep with 16 oz. of clear liquid of your choice.    3. You may drink clear liquids until 7:30 am (2 HOURS BEFORE PROCEDURE).   MEDICATION INSTRUCTIONS  Unless otherwise instructed, you should take regular prescription medications with a small sip of water   as early as possible the morning of  your procedure.  Hold Triamterene-HCTZ before coming in the day of your procedure (only hold on 12-25-09)       OTHER INSTRUCTIONS  You will need a responsible adult at least 75 years of age to accompany you and drive you home.   This person must remain in the waiting room during your procedure.  Wear loose fitting clothing that is easily removed.  Leave jewelry and other valuables at home.  However, you may wish to bring a book to read or  an iPod/MP3 player to listen to music as you wait for your procedure to start.  Remove all body piercing jewelry and leave at home.  Total time from sign-in until discharge is approximately 2-3 hours.  You should go home directly after your procedure and rest.  You can resume normal activities the  day after your procedure.  The day of your procedure you should not:   Drive   Make legal decisions   Operate machinery   Drink alcohol   Return to work  You will receive specific instructions about eating, activities and medications before you leave.    The above instructions have been reviewed and explained  to me by   Karl Bales RN  December 11, 2009 10:55 AM     I fully understand and can verbalize these instructions _____________________________ Date _________

## 2010-11-10 ENCOUNTER — Other Ambulatory Visit: Payer: Self-pay

## 2010-11-12 ENCOUNTER — Ambulatory Visit: Payer: Medicare Other

## 2010-11-12 DIAGNOSIS — E559 Vitamin D deficiency, unspecified: Secondary | ICD-10-CM

## 2010-11-12 DIAGNOSIS — E530 Riboflavin deficiency: Secondary | ICD-10-CM

## 2010-11-12 LAB — BASIC METABOLIC PANEL
Calcium: 9.2 mg/dL (ref 8.4–10.5)
GFR: 58.13 mL/min — ABNORMAL LOW (ref >60.00)
Glucose, Bld: 90 mg/dL (ref 70–99)
Potassium: 4.2 mEq/L (ref 3.5–5.1)
Sodium: 138 mEq/L (ref 135–145)

## 2010-11-12 LAB — URINALYSIS
Bilirubin Urine: NEGATIVE
Hgb urine dipstick: NEGATIVE
Ketones, ur: NEGATIVE
Nitrite: NEGATIVE
Total Protein, Urine: NEGATIVE

## 2010-11-12 LAB — CBC WITH DIFFERENTIAL/PLATELET
Basophils Relative: 0.5 % (ref 0.0–3.0)
Eosinophils Relative: 2.7 % (ref 0.0–5.0)
HCT: 39 % (ref 36.0–46.0)
Lymphs Abs: 1.5 10*3/uL (ref 0.7–4.0)
MCV: 93.9 fl (ref 78.0–100.0)
Monocytes Absolute: 0.3 10*3/uL (ref 0.1–1.0)
RBC: 4.15 Mil/uL (ref 3.87–5.11)
WBC: 3 10*3/uL — ABNORMAL LOW (ref 4.5–10.5)

## 2010-11-12 LAB — HEPATIC FUNCTION PANEL
ALT: 17 U/L (ref 0–35)
AST: 21 U/L (ref 0–37)
Albumin: 3.7 g/dL (ref 3.5–5.2)
Alkaline Phosphatase: 54 U/L (ref 39–117)
Total Bilirubin: 0.7 mg/dL (ref 0.3–1.2)

## 2010-11-12 LAB — LIPID PANEL
Total CHOL/HDL Ratio: 3
VLDL: 24.8 mg/dL (ref 0.0–40.0)

## 2010-11-12 LAB — VITAMIN B12: Vitamin B-12: 579 pg/mL (ref 211–911)

## 2010-11-17 ENCOUNTER — Encounter: Payer: Self-pay | Admitting: Internal Medicine

## 2010-11-17 ENCOUNTER — Ambulatory Visit (INDEPENDENT_AMBULATORY_CARE_PROVIDER_SITE_OTHER): Payer: Medicare Other | Admitting: Internal Medicine

## 2010-11-17 DIAGNOSIS — K219 Gastro-esophageal reflux disease without esophagitis: Secondary | ICD-10-CM

## 2010-11-17 DIAGNOSIS — Z0000 Encounter for general adult medical examination without abnormal findings: Secondary | ICD-10-CM

## 2010-11-17 DIAGNOSIS — I10 Essential (primary) hypertension: Secondary | ICD-10-CM

## 2010-11-17 DIAGNOSIS — E538 Deficiency of other specified B group vitamins: Secondary | ICD-10-CM

## 2010-11-17 NOTE — Patient Instructions (Signed)
Return in 6 months please

## 2010-11-17 NOTE — Assessment & Plan Note (Signed)
-   Controlled on 2 meds 

## 2010-11-17 NOTE — Assessment & Plan Note (Signed)
On meds

## 2010-11-17 NOTE — Progress Notes (Signed)
Subjective:    Patient ID: Jasmin [name], female    DOB: 1934/08/13, 77 y.o.   MRN: [id number]  HPI  The patient is here for a wellness exam. The patient has been doing well overall without major medical or psychological issues going on lately. The patient needs to address her chronic hypertension that has been well controlled with medicines,chronic  hyperlipidemia controlled with medicines as well and type 2 chronic diabetes, controlled with medical treatment. GYN care is up-to-date.  Review of Systems  Constitutional: Negative.  Negative for fever, chills, diaphoresis, activity change, appetite change, fatigue and unexpected weight change.  HENT: Negative for hearing loss, ear pain, nosebleeds, congestion, sore throat, facial swelling, rhinorrhea, sneezing, mouth sores, trouble swallowing, neck pain, neck stiffness, postnasal drip, sinus pressure and tinnitus.   Eyes: Negative for pain, discharge, redness, itching and visual disturbance.  Respiratory: Negative for cough, chest tightness, shortness of breath, wheezing and stridor.   Cardiovascular: Negative for chest pain, palpitations and leg swelling.  Gastrointestinal: Negative for nausea, diarrhea, constipation, blood in stool, abdominal distention, anal bleeding and rectal pain.       GERD symptoms  Genitourinary: Negative for dysuria, urgency, frequency, hematuria, flank pain, vaginal bleeding, vaginal discharge, difficulty urinating, genital sores and pelvic pain.  Musculoskeletal: Negative for back pain, joint swelling, arthralgias and gait problem.  Skin: Negative.  Negative for rash.  Neurological: Negative for dizziness, tremors, seizures, syncope, speech difficulty, weakness, numbness and headaches.  Hematological: Negative for adenopathy. Does not bruise/bleed easily.  Psychiatric/Behavioral: Negative for suicidal ideas, behavioral problems, sleep disturbance, dysphoric mood and decreased concentration. The patient is not  nervous/anxious.        Objective:   Physical Exam  Constitutional: She appears well-developed and well-nourished. No distress.  HENT:  Head: Normocephalic.  Right Ear: External ear normal.  Left Ear: External ear normal.  Nose: Nose normal.  Mouth/Throat: Oropharynx is clear and moist.  Eyes: Conjunctivae are normal. Pupils are equal, round, and reactive to light. Right eye exhibits no discharge. Left eye exhibits no discharge.  Neck: Normal range of motion. Neck supple. No JVD present. No tracheal deviation present. No thyromegaly present.  Cardiovascular: Normal rate, regular rhythm and normal heart sounds.   Pulmonary/Chest: No stridor. No respiratory distress. She has no wheezes.  Abdominal: Soft. Bowel sounds are normal. She exhibits no distension and no mass. There is no tenderness. There is no rebound and no guarding.  Musculoskeletal: She exhibits no edema and no tenderness.  Lymphadenopathy:    She has no cervical adenopathy.  Neurological: She displays normal reflexes. No cranial nerve deficit. She exhibits normal muscle tone. Coordination normal.  Skin: No rash noted. No erythema.  Psychiatric: She has a normal mood and affect. Her behavior is normal. Judgment and thought content normal.          Assessment & Plan:  Well adult exam The patient is here for annual Medicare wellness examination and management of other chronic and acute problems.  The risk factors are reflected in the social history. The roster all physicians providing medical care to patient - please, see in the Snapshot section of the chart.  Activities of daily living:  The patient is able 2 prepare food and peds date getting dressed using the toilet morning around from place to place without assistance.     Home safety :    The patient has smoke detectors and wears seatbelts. No firearms at home. No excess sun exposure.  Diet and exercise:  The patient  does exercise.  The need to exercise  regularly was discussed. Dietary issues discussed. Healthy diet discussed.      Depression screen :  Over the past 2 weeks, the patient has not felt down, depressed or hopeless. Over the past 2 weeks, the patient hasn't felt little interest or pleasure in doing things.      Past surgical history and problem list:  The following portions of the patient's history were reviewed and updated as appropriate:  allergies, current medications, past family history, past medical history,  past surgical history, past social history  and problem list.  Vision, hearing, body mass index were assessed and reviewed. Cognitive impairment assessment revealed cognition, memory and judgment appear normal.   During the course of the visit the patient was educated and counseled about appropriate screening and preventive services including : fall prevention , diabetes screening, nutrition counseling, vaccination.   GERD Controlled on 2 meds  HYPERTENSION Controlled. Cont meds  B12 DEFICIENCY On meds

## 2010-11-17 NOTE — Assessment & Plan Note (Signed)
Controlled  Con't meds 

## 2010-11-17 NOTE — Assessment & Plan Note (Signed)
The patient is here for annual Medicare wellness examination and management of other chronic and acute problems.  The risk factors are reflected in the social history. The roster all physicians providing medical care to patient - please, see in the Snapshot section of the chart.  Activities of daily living:  The patient is able 2 prepare food and peds date getting dressed using the toilet morning around from place to place without assistance.     Home safety :    The patient has smoke detectors and wears seatbelts. No firearms at home. No excess sun exposure.      Diet and exercise:  The patient  does exercise.  The need to exercise regularly was discussed. Dietary issues discussed. Healthy diet discussed.      Depression screen :  Over the past 2 weeks, the patient has not felt down, depressed or hopeless. Over the past 2 weeks, the patient hasn't felt little interest or pleasure in doing things.      Past surgical history and problem list:  The following portions of the patient's history were reviewed and updated as appropriate:  allergies, current medications, past family history, past medical history,  past surgical history, past social history  and problem list.  Vision, hearing, body mass index were assessed and reviewed. Cognitive impairment assessment revealed cognition, memory and judgment appear normal.   During the course of the visit the patient was educated and counseled about appropriate screening and preventive services including : fall prevention , diabetes screening, nutrition counseling, vaccination.  

## 2010-11-23 LAB — CBC
Hemoglobin: 14.3 g/dL (ref 12.0–15.0)
MCHC: 34.7 g/dL (ref 30.0–36.0)
RBC: 4.37 MIL/uL (ref 3.87–5.11)
WBC: 3 10*3/uL — ABNORMAL LOW (ref 4.0–10.5)

## 2010-11-23 LAB — URINE MICROSCOPIC-ADD ON

## 2010-11-23 LAB — COMPREHENSIVE METABOLIC PANEL
ALT: 22 U/L (ref 0–35)
AST: 27 U/L (ref 0–37)
CO2: 29 mEq/L (ref 19–32)
Chloride: 99 mEq/L (ref 96–112)
GFR calc Af Amer: 52 mL/min — ABNORMAL LOW (ref >60)
GFR calc non Af Amer: 43 mL/min — ABNORMAL LOW (ref >60)
Sodium: 137 mEq/L (ref 135–145)
Total Bilirubin: 0.6 mg/dL (ref 0.3–1.2)

## 2010-11-23 LAB — RAPID URINE DRUG SCREEN, HOSP PERFORMED
Barbiturates: NOT DETECTED
Opiates: NOT DETECTED
Tetrahydrocannabinol: NOT DETECTED

## 2010-11-23 LAB — CK TOTAL AND CKMB (NOT AT ARMC)
CK, MB: 2.5 ng/mL (ref 0.3–4.0)
Total CK: 123 U/L (ref 7–177)

## 2010-11-23 LAB — URINE CULTURE: Colony Count: >=100000

## 2010-11-23 LAB — TSH: TSH: 0.817 u[IU]/mL (ref 0.350–4.500)

## 2010-11-23 LAB — TROPONIN I
Troponin I: 0.01 ng/mL (ref 0.00–0.06)
Troponin I: <0.01 ng/mL (ref 0.00–0.06)

## 2010-11-23 LAB — URINALYSIS, ROUTINE W REFLEX MICROSCOPIC
Bilirubin Urine: NEGATIVE
Hgb urine dipstick: NEGATIVE
Nitrite: NEGATIVE
Specific Gravity, Urine: 1.009 (ref 1.005–1.030)
pH: 6.5 (ref 5.0–8.0)

## 2010-11-23 LAB — DIFFERENTIAL
Basophils Absolute: 0 10*3/uL (ref 0.0–0.1)
Eosinophils Absolute: 0 10*3/uL (ref 0.0–0.7)
Eosinophils Relative: 1 % (ref 0–5)

## 2010-11-23 LAB — APTT: aPTT: 26 seconds (ref 24–37)

## 2010-11-23 LAB — BRAIN NATRIURETIC PEPTIDE: Pro B Natriuretic peptide (BNP): <30 pg/mL (ref 0.0–100.0)

## 2010-11-23 LAB — LIPASE, BLOOD: Lipase: 29 U/L (ref 11–59)

## 2010-11-27 ENCOUNTER — Emergency Department (HOSPITAL_COMMUNITY)
Admission: EM | Admit: 2010-11-27 | Discharge: 2010-11-27 | Disposition: A | Discharge status: Home / Self Care | Payer: Medicare Other | Attending: Emergency Medicine | Admitting: Emergency Medicine

## 2010-11-27 DIAGNOSIS — K219 Gastro-esophageal reflux disease without esophagitis: Secondary | ICD-10-CM | POA: Insufficient documentation

## 2010-11-27 DIAGNOSIS — R071 Chest pain on breathing: Secondary | ICD-10-CM | POA: Insufficient documentation

## 2010-11-27 DIAGNOSIS — I10 Essential (primary) hypertension: Secondary | ICD-10-CM | POA: Insufficient documentation

## 2010-11-27 DIAGNOSIS — E7800 Pure hypercholesterolemia, unspecified: Secondary | ICD-10-CM | POA: Insufficient documentation

## 2010-11-27 LAB — URINALYSIS, ROUTINE W REFLEX MICROSCOPIC
Bilirubin Urine: NEGATIVE
Glucose, UA: NEGATIVE mg/dL
Hgb urine dipstick: NEGATIVE
Ketones, ur: NEGATIVE mg/dL
pH: 7 (ref 5.0–8.0)

## 2010-11-30 ENCOUNTER — Telehealth: Payer: Self-pay

## 2010-11-30 NOTE — Telephone Encounter (Signed)
[facility] Triage Call Report Triage Record Num: [id number] Operator: Di Kindle Patient Name: Jasmin [name] Call Date & Time: 11/27/2010 9:16:29AM Patient Phone: [phone] PCP: Sonda Primes Patient Gender: Female PCP Fax : [phone] Patient DOB: 30-Jul-1934 Practice Name: [facility] Reason for Call: Pt calling to report onset 11/26/10 of intermittent right side abd pain,lower abd, intense at times, tried MOM, no relief. Guideline: Abd Pain, with generalized pain, unable to carry out normal activities, advised of Need to be seen at ED/UC. Protocol(s) Used: Abdominal Pain Recommended Outcome per Protocol: See ED Immediately Reason for Outcome: Generalized abdominal pain that progresses to localized pain AND any of the following: loss of appetite, vomiting starting after pain, any fever, OR unable to carry out normal activities Care Advice: ~ Another adult should drive. ~ Pain medication or laxatives should not be taken until symptoms are evaluated. Call EMS 911 if signs and symptoms of shock develop (such as unable to stand due to faintness, dizziness, or lightheadedness; new onset of confusion; slow to respond or difficult to awaken; skin is pale, gray, cool, or moist to touch; severe weakness; loss of consciousness). ~ 11/27/2010 9:24:51AM Page 1 of 1 [id number]

## 2010-12-03 ENCOUNTER — Ambulatory Visit (INDEPENDENT_AMBULATORY_CARE_PROVIDER_SITE_OTHER): Payer: Medicare Other | Admitting: Internal Medicine

## 2010-12-03 ENCOUNTER — Encounter: Payer: Self-pay | Admitting: Internal Medicine

## 2010-12-03 ENCOUNTER — Other Ambulatory Visit (INDEPENDENT_AMBULATORY_CARE_PROVIDER_SITE_OTHER): Payer: Medicare Other

## 2010-12-03 VITALS — BP 140/90 | HR 68 | Temp 98.4°F | Resp 16 | Wt 213.0 lb

## 2010-12-03 DIAGNOSIS — R1031 Right lower quadrant pain: Secondary | ICD-10-CM

## 2010-12-03 LAB — COMPREHENSIVE METABOLIC PANEL
ALT: 20 U/L (ref 0–35)
Albumin: 4.2 g/dL (ref 3.5–5.2)
CO2: 30 mEq/L (ref 19–32)
GFR: 55.79 mL/min — ABNORMAL LOW (ref >60.00)
Glucose, Bld: 87 mg/dL (ref 70–99)
Potassium: 4 mEq/L (ref 3.5–5.1)
Sodium: 136 mEq/L (ref 135–145)
Total Protein: 7.6 g/dL (ref 6.0–8.3)

## 2010-12-03 LAB — URINALYSIS
Ketones, ur: NEGATIVE
Specific Gravity, Urine: 1.015 (ref 1.000–1.030)
Total Protein, Urine: NEGATIVE
Urine Glucose: NEGATIVE
pH: 6.5 (ref 5.0–8.0)

## 2010-12-03 LAB — CBC WITH DIFFERENTIAL/PLATELET
Basophils Absolute: 0 10*3/uL (ref 0.0–0.1)
Eosinophils Relative: 1.9 % (ref 0.0–5.0)
HCT: 41.4 % (ref 36.0–46.0)
Lymphs Abs: 1.9 10*3/uL (ref 0.7–4.0)
MCV: 93.2 fl (ref 78.0–100.0)
Monocytes Absolute: 0.3 10*3/uL (ref 0.1–1.0)
Monocytes Relative: 7.9 % (ref 3.0–12.0)
Neutrophils Relative %: 46.9 % (ref 43.0–77.0)
Platelets: 159 10*3/uL (ref 150.0–400.0)
RDW: 14.4 % (ref 11.5–14.6)
WBC: 4.4 10*3/uL — ABNORMAL LOW (ref 4.5–10.5)

## 2010-12-03 LAB — SEDIMENTATION RATE: Sed Rate: 15 mm/hr (ref 0–22)

## 2010-12-03 MED ORDER — CIPROFLOXACIN HCL 500 MG PO TABS: 500.0000 mg | ORAL_TABLET | Freq: Two times a day (BID) | ORAL | Status: DC

## 2010-12-03 NOTE — Assessment & Plan Note (Signed)
R/o appendicitis, diverticulitis etc Empiric Cipro Check CT Get labs

## 2010-12-03 NOTE — Progress Notes (Signed)
  Subjective:    Patient ID: Jasmin [name], female    DOB: 10-01-1933, 77 y.o.   MRN: [id number]  HPI  C/o sharp abd pain in her LLQ since last Thursday. She went to ER on Friday. Her urine was checked. She was given Vicodin for a "muscle strain"  Review of Systems  Constitutional: Negative for fever, chills, appetite change and unexpected weight change.  HENT: Negative for sinus pressure.   Respiratory: Negative for cough.   Cardiovascular: Negative for chest pain.  Gastrointestinal: Positive for abdominal pain (as above). Negative for nausea, vomiting, diarrhea, constipation, blood in stool, abdominal distention and anal bleeding.  Genitourinary: Negative for dysuria and urgency.  Musculoskeletal: Negative for back pain.  Skin: Negative for rash.  Neurological: Negative for weakness.       Objective:   Physical Exam  Constitutional: She appears well-developed.       Obese  HENT:  Mouth/Throat: No oropharyngeal exudate.  Eyes: Pupils are equal, round, and reactive to light. No scleral icterus.  Neck: No JVD present.  Cardiovascular: Normal rate and regular rhythm.  Exam reveals no gallop.   Pulmonary/Chest: She has no wheezes. She exhibits no tenderness.  Abdominal: She exhibits no distension and no mass. There is tenderness (RLQ is sensitve). There is no rebound and no guarding.  Musculoskeletal: She exhibits no edema.  Neurological: Coordination normal.  Skin: No rash noted.          Assessment & Plan:  RLQ abdominal pain R/o appendicitis, diverticulitis etc Empiric Cipro Check CT Get labs

## 2010-12-03 NOTE — Patient Instructions (Signed)
Go to ER if worse 

## 2010-12-04 ENCOUNTER — Ambulatory Visit (INDEPENDENT_AMBULATORY_CARE_PROVIDER_SITE_OTHER)
Admission: RE | Admit: 2010-12-04 | Discharge: 2010-12-04 | Disposition: A | Discharge status: Home / Self Care | Payer: Medicare Other | Source: Ambulatory Visit | Attending: Internal Medicine | Admitting: Internal Medicine

## 2010-12-04 ENCOUNTER — Telehealth: Payer: Self-pay | Admitting: Internal Medicine

## 2010-12-04 DIAGNOSIS — R1031 Right lower quadrant pain: Secondary | ICD-10-CM

## 2010-12-04 MED ORDER — IOHEXOL 300 MG/ML  SOLN
100.0000 mL | Freq: Once | INTRAMUSCULAR | Status: AC | PRN
  Administered 2010-12-04: 100 mL via INTRAVENOUS

## 2010-12-04 NOTE — Telephone Encounter (Signed)
Jasmin [name] , please, inform the patient:  the abd CT is OK. She can travel. Take Cipro.  Please, keep  next office visit appointment.   Thank you !

## 2010-12-07 NOTE — Telephone Encounter (Signed)
Left mess for patient to call back.  

## 2010-12-08 ENCOUNTER — Other Ambulatory Visit: Payer: Self-pay | Admitting: Internal Medicine

## 2010-12-08 NOTE — Telephone Encounter (Signed)
Called and advised pt's spouse of below.

## 2010-12-10 ENCOUNTER — Telehealth: Payer: Self-pay | Admitting: Internal Medicine

## 2010-12-10 NOTE — Telephone Encounter (Signed)
Pt's husband walked in office today....the patient is scheduled for tom 12-11-10 at 11:15am because last OV on 4-12 states due back in 10 days or go to ER if stmptoms worsen/change.

## 2010-12-10 NOTE — Telephone Encounter (Signed)
Caller states that Pt is still having RLQ pain that she was treated for. They have been out of town and are now on their way back home. Caller would like to know if Pt needs to come in for f/u OV.? Please advise.

## 2010-12-11 ENCOUNTER — Encounter: Payer: Self-pay | Admitting: Internal Medicine

## 2010-12-11 ENCOUNTER — Emergency Department (HOSPITAL_COMMUNITY): Payer: Medicare Other

## 2010-12-11 ENCOUNTER — Emergency Department (HOSPITAL_COMMUNITY)
Admission: EM | Admit: 2010-12-11 | Discharge: 2010-12-11 | Disposition: A | Discharge status: Home / Self Care | Payer: Medicare Other | Attending: Emergency Medicine | Admitting: Emergency Medicine

## 2010-12-11 ENCOUNTER — Ambulatory Visit: Payer: Medicare Other | Admitting: Internal Medicine

## 2010-12-11 ENCOUNTER — Ambulatory Visit (INDEPENDENT_AMBULATORY_CARE_PROVIDER_SITE_OTHER): Payer: Medicare Other | Admitting: Internal Medicine

## 2010-12-11 VITALS — BP 112/68 | HR 65 | Temp 98.1°F | Resp 14 | Wt 212.8 lb

## 2010-12-11 DIAGNOSIS — I10 Essential (primary) hypertension: Secondary | ICD-10-CM | POA: Insufficient documentation

## 2010-12-11 DIAGNOSIS — N281 Cyst of kidney, acquired: Secondary | ICD-10-CM | POA: Insufficient documentation

## 2010-12-11 DIAGNOSIS — K7689 Other specified diseases of liver: Secondary | ICD-10-CM | POA: Insufficient documentation

## 2010-12-11 DIAGNOSIS — K219 Gastro-esophageal reflux disease without esophagitis: Secondary | ICD-10-CM | POA: Insufficient documentation

## 2010-12-11 DIAGNOSIS — E7800 Pure hypercholesterolemia, unspecified: Secondary | ICD-10-CM | POA: Insufficient documentation

## 2010-12-11 DIAGNOSIS — R1011 Right upper quadrant pain: Secondary | ICD-10-CM | POA: Insufficient documentation

## 2010-12-11 DIAGNOSIS — R1031 Right lower quadrant pain: Secondary | ICD-10-CM

## 2010-12-11 LAB — URINALYSIS, ROUTINE W REFLEX MICROSCOPIC
Glucose, UA: NEGATIVE mg/dL
Ketones, ur: NEGATIVE mg/dL
Nitrite: NEGATIVE
Protein, ur: NEGATIVE mg/dL

## 2010-12-11 LAB — DIFFERENTIAL
Basophils Absolute: 0 10*3/uL (ref 0.0–0.1)
Basophils Relative: 0 % (ref 0–1)
Eosinophils Absolute: 0.1 10*3/uL (ref 0.0–0.7)
Eosinophils Relative: 2 % (ref 0–5)
Monocytes Absolute: 0.3 10*3/uL (ref 0.1–1.0)
Neutro Abs: 1.5 10*3/uL — ABNORMAL LOW (ref 1.7–7.7)

## 2010-12-11 LAB — COMPREHENSIVE METABOLIC PANEL
ALT: 17 U/L (ref 0–35)
AST: 19 U/L (ref 0–37)
Calcium: 9.1 mg/dL (ref 8.4–10.5)
GFR calc Af Amer: 56 mL/min — ABNORMAL LOW (ref >60)
Sodium: 142 mEq/L (ref 135–145)
Total Protein: 6.3 g/dL (ref 6.0–8.3)

## 2010-12-11 LAB — CBC
MCHC: 33.8 g/dL (ref 30.0–36.0)
Platelets: 139 10*3/uL — ABNORMAL LOW (ref 150–400)
RDW: 14.2 % (ref 11.5–15.5)

## 2010-12-11 NOTE — Progress Notes (Signed)
F/u abd pain - off and on. She got better after the CT scan. She had to go to ER this am 12/11/10. She had an [place].  Review of Systems  Constitutional: Negative for activity change, appetite change and fatigue.  HENT: Negative for ear pain, congestion, sneezing, trouble swallowing, neck pain and voice change.  Eyes: Negative for photophobia, pain, itching and visual disturbance.  Respiratory: Negative for cough, chest tightness and wheezing.  Cardiovascular: Negative for chest pain, palpitations and leg swelling.  Gastrointestinal:As above.  Negative for vomiting, blood in stool, abdominal distention and rectal pain.  Genitourinary: Negative for dysuria, hematuria, flank pain and genital sores.  Musculoskeletal: Negative for myalgias, back pain, joint swelling, arthralgias and gait problem.  Neurological: Negative for dizziness, syncope, weakness, light-headedness and headaches.  Hematological: Negative for adenopathy.  Psychiatric/Behavioral: Negative for suicidal ideas, behavioral problems, sleep disturbance and agitation  .  Exam:  NAD, obese  HEENT - moist  Lungs B CTA  CV: heart w/RRR  Abd s/nt - no HSM  A/o/c  MS, DTRs - all nl  LE - no edema B   A/P:  RLQ abdominal pain Recurrent. RUQ/RLQ  --- ? Colon spasm vs CBD stone vs ureter stone vs other. Use Vicodin prn. She has no h/o adhesions.   US Abdomen Complete (Final result)   Result time:12/11/10 [phone]    Final result by Rad Results In Interface (12/11/10 08:23:02)    Narrative:   *RADIOLOGY REPORT*  Clinical Data: Right upper quadrant pain  COMPLETE ABDOMINAL ULTRASOUND  Comparison: No prior ultrasound. There is correlation with CT of the abdomen 12/04/2010  Findings:  Gallbladder: No gallstones, gallbladder wall thickening, or pericholecystic fluid.  Common bile duct: 5 mm  Liver: Multiple simple hepatic cysts. The largest cyst is 4.4 x 3.5 x 5.5 cm. Slight increased echogenicity of the  liver parenchyma.  IVC: Normal  Pancreas: Head and body normal. Tail obscured by bowel gas.  Spleen: Normal  Right Kidney: 11.4 cm in sagittal length. Multiple cysts are noted.  Left Kidney: 10.7 cm in sagittal length. Multiple parapelvic cysts noted.  Abdominal aorta: Proximal and mid aorta normal. Distal aorta obscured by bowel gas.  IMPRESSION:  1. Gallbladder and biliary tree normal. 2. Numerous hepatic and bilateral renal cysts as noted on recent CT. 3. Tail of pancreas and distal aorta obscured by bowel gas.  Original Report Authenticated By: Bernerd Limbo, M.D.          Likely it is a functional pain - spasm(?) CT was OK. GI consult. Lab Results  Component Value Date   WBC 3.2* 12/11/2010   HGB 13.6 12/11/2010   HCT 40.2 12/11/2010   PLT 139* 12/11/2010   CHOL 186 11/12/2010   TRIG 124.0 11/12/2010   HDL 56.70 11/12/2010   ALT 17 12/11/2010   AST 19 12/11/2010   NA 142 12/11/2010   K 3.4* 12/11/2010   CL 105 12/11/2010   CREATININE 1.14 12/11/2010   BUN 17 12/11/2010   CO2 30 12/11/2010   TSH 1.59 11/12/2010   INR 1.16 08/16/2009

## 2010-12-11 NOTE — Assessment & Plan Note (Addendum)
Recurrent. RUQ/RLQ  --- ? Colon spasm vs CBD stone vs ureter stone vs other. Use Vicodin prn. She has no h/o adhesions.   US Abdomen Complete (Final result)   Result time:12/11/10 [phone]    Final result by Rad Results In Interface (12/11/10 08:23:02)    Narrative:   *RADIOLOGY REPORT*  Clinical Data: Right upper quadrant pain  COMPLETE ABDOMINAL ULTRASOUND  Comparison: No prior ultrasound. There is correlation with CT of the abdomen 12/04/2010  Findings:  Gallbladder: No gallstones, gallbladder wall thickening, or pericholecystic fluid.  Common bile duct: 5 mm  Liver: Multiple simple hepatic cysts. The largest cyst is 4.4 x 3.5 x 5.5 cm. Slight increased echogenicity of the liver parenchyma.  IVC: Normal  Pancreas: Head and body normal. Tail obscured by bowel gas.  Spleen: Normal  Right Kidney: 11.4 cm in sagittal length. Multiple cysts are noted.  Left Kidney: 10.7 cm in sagittal length. Multiple parapelvic cysts noted.  Abdominal aorta: Proximal and mid aorta normal. Distal aorta obscured by bowel gas.  IMPRESSION:  1. Gallbladder and biliary tree normal. 2. Numerous hepatic and bilateral renal cysts as noted on recent CT. 3. Tail of pancreas and distal aorta obscured by bowel gas.  Original Report Authenticated By: Bernerd Limbo, M.D.          ent functional pain - spasm(?) CT was OK

## 2010-12-11 NOTE — Patient Instructions (Signed)
You will follow up with Dr Marina Goodell.

## 2010-12-13 ENCOUNTER — Encounter: Payer: Self-pay | Admitting: Internal Medicine

## 2011-01-04 ENCOUNTER — Encounter: Payer: Self-pay | Admitting: Internal Medicine

## 2011-01-04 ENCOUNTER — Ambulatory Visit (INDEPENDENT_AMBULATORY_CARE_PROVIDER_SITE_OTHER): Payer: Medicare Other | Admitting: Internal Medicine

## 2011-01-04 VITALS — BP 132/80 | HR 68 | Ht 67.0 in | Wt 212.0 lb

## 2011-01-04 DIAGNOSIS — R1011 Right upper quadrant pain: Secondary | ICD-10-CM

## 2011-01-04 DIAGNOSIS — Z86010 Personal history of colonic polyps: Secondary | ICD-10-CM

## 2011-01-04 DIAGNOSIS — K219 Gastro-esophageal reflux disease without esophagitis: Secondary | ICD-10-CM

## 2011-01-04 DIAGNOSIS — Z1211 Encounter for screening for malignant neoplasm of colon: Secondary | ICD-10-CM

## 2011-01-04 MED ORDER — PEG-KCL-NACL-NASULF-NA ASC-C 100 G PO SOLR: 1.0000 | Freq: Once | ORAL | Status: AC

## 2011-01-04 NOTE — Progress Notes (Signed)
HISTORY OF PRESENT ILLNESS:  Jasmin [name] is a 77 y.o. female with multiple medical problems as listed below. She is followed in this clinic regarding GERD complicated by peptic stricture which has required esophageal dilation, and multiple adenomatous colon polyps. She presents today for evaluation of right upper quadrant abdominal pain. She reports 3 discrete episodes of pain occurring April 6, April 18, and 12/11/2010. The pain is described as sharp and lasting less than a minute. Possibly worse after meals or with certain movements, though quite unclear given the short duration. No other symptoms such as nausea, vomiting, fever, change in bowel or bladder habits. She does have chronic constipation and occasionally sees blood per rectum which she attributes to hemorrhoids. For the problem with acute pain, she sought an emergency room evaluation on 12/11/2010. CBC, comprehensive metabolic panel, lipase, and urinalysis were unrevealing. Abdominal ultrasound as well as CT scan of the abdomen and pelvis were performed. These were remarkable for simple hepatic and renal cysts. The largest hepatic cyst measured 5.5 x 4.4 x 3.5 cm. Which is in no recurrent pain in over 3 weeks. No active GERD symptoms. She is due for surveillance colonoscopy at this time.  REVIEW OF SYSTEMS:  All non-GI ROS negative except for back pain.  Past Medical History  Diagnosis Date  . Diverticulosis of colon   . Hyperlipidemia   . Hypertension   . GERD with stricture     Dr Marina Goodell  . Vitamin B12 deficiency 2009    Borderline  . Hemorrhoids   . Hiatal hernia   . Polycystic kidney   . Esophageal stricture   . Tubulovillous adenoma polyp of colon   . Onychomycosis   . Personal history of urinary calculi   . Contact dermatitis and other eczema, due to unspecified cause   . Unspecified vitamin D deficiency     Past Surgical History  Procedure Date  . Tonsillectomy   . Dilation and curettage of uterus 1977  .  Toe surgery     Right foot     Social History Jasmin [name]  reports that she has never smoked. She has never used smokeless tobacco. She reports that she does not drink alcohol or use illicit drugs.  family history is negative for Colon cancer.  Allergies  Allergen Reactions  . Clarithromycin   . Penicillins   . Sulfonamide Derivatives        PHYSICAL EXAMINATION: Vital signs: BP 132/80  Pulse 68  Ht 5\' 7"  (1.702 m)  Wt 212 lb (96.163 kg)  BMI 33.20 kg/m2  Constitutional: generally well-appearing, no acute distress Psychiatric: alert and oriented x3, cooperative Eyes: extraocular movements intact, anicteric, conjunctiva pink Mouth: oral pharynx moist, no lesions Neck: supple no lymphadenopathy Cardiovascular: heart regular rate and rhythm, no murmur Lungs: clear to auscultation bilaterally Abdomen: soft, nontender, nondistended, no obvious ascites, no peritoneal signs, normal bowel sounds, no organomegaly Rectal: Deferred until colonoscopy Extremities: no lower extremity edema bilaterally Skin: no lesions on visible extremities Neuro: No focal deficits. No asterixis.    ASSESSMENT:  #1. Fleeting sharp right upper quadrant/side pain. Likely musculoskeletal. Negative workup. No recurrence x3 weeks. No further workup indicated. #2. History of multiple adenomatous colon polyps. Due for followup #3. GERD with peptic stricture. Currently asymptomatic post dilation   PLAN:  #1. Continue PPI #2. Surveillance colonoscopy.The nature of the procedure, as well as the risks, benefits, and alternatives were carefully and thoroughly reviewed with the patient. Ample time for discussion and questions allowed.  The patient understood, was satisfied, and agreed to proceed. Movi prep prescribed. Patient instructed on its use

## 2011-01-04 NOTE — Patient Instructions (Signed)
Colon lec 01/07/11 2:30 pm arrive at 1:30 pm 4th floor Moviprep prescription has been sent to your pharmacy Colon brochure given to you to review.

## 2011-01-05 ENCOUNTER — Telehealth: Payer: Self-pay | Admitting: Internal Medicine

## 2011-01-05 NOTE — Assessment & Plan Note (Signed)
[facility]                         GASTROENTEROLOGY OFFICE NOTE   NAME:[name], [name]                  MRN:          [id number]  DATE:03/17/2007                            DOB:          Mar 19, 1934    REASON FOR CONSULTATION:  Abdominal pain.   HISTORY:  This is a 77 year old African-American female with a history  of hypertension and hyperlipidemia who is referred through the courtesy  of Dr. Posey [name] regarding epigastric pain. The patient has a history of  indigestion and heartburn. She was having trouble with chest pain. She  was treated with Prevacid. Her chest pain resolved. She subsequently  developed problems with epigastric and upper abdominal discomfort in a  band-like distribution. This has been occurring intermittently over the  past several months. It is not necessarily affected by meals. It  generally lasts about an hour. There is no radiation into the back. This  is not necessarily exacerbated by meals. This can awaken her at night.  She can have no episodes during the week or have two or three episodes  during the week. There is no associated nausea or vomiting. She denies a  prior history of similar problems. She has had no work up. She denies  any change in appetite or weight loss. No melena. She was having some  constipation after being placed on Prevacid, however this has resolved.  The patient's only other GI evaluation was a screening colonoscopy  performed in December 2002. This was normal except for mild sigmoid  diverticulosis. The patient does mention that when swallowing pills that  they feel like that there is some difficulty going down. She points to  the epigastric region.   PAST MEDICAL HISTORY:  1. Hypertension.  2. Hyperlipidemia.   PAST SURGICAL HISTORY:  None.   ALLERGIES:  1. SULFA.  2. BIAXIN.  3. PENICILLIN.   CURRENT MEDICATIONS:  1. Aspirin 325 mg daily.  2. Mevacor 40 mg nightly.  3. Vitamin  D.  4. Prevacid 30 mg daily.  5. Lovastatin 40 mg daily.  6. Triamterene/hydrochlorothiazide 37.5/25 mg daily.  7. Calcium supplement.  8. MiraLax p.r.n.   FAMILY HISTORY:  No family history of gastrointestinal malignancy.  Father with heart disease.   SOCIAL HISTORY:  The patient is married with two sons. She is  accompanied by her husband. She is a retired Games developer. She does not  smoke or use alcohol. She has a 12th grade education.   REVIEW OF SYSTEMS:  Per diagnostic evaluation form.   PHYSICAL EXAMINATION:  GENERAL:  Pleasant well appearing female in no  acute distress.  VITAL SIGNS:  Blood pressure 128/72, heart rate 80, weight 214.8 pounds.  Height 5 foot 7 inches.  HEENT:  Sclerae anicteric, conjunctiva are pink, oral mucosa is intact,  there is no adenopathy.  LUNGS:  Clear to auscultation.  HEART:  Regular without murmur.  ABDOMEN:  Obese and soft without tenderness, mass, or hernia. Good bowel  sounds are heard.  EXTREMITIES:  Without edema.   IMPRESSION:  1. Two month history of intermittent upper abdominal pain. Symptoms      are most  compatible with biliary colic.  2. Gastroesophageal reflux disease. Seemingly improved on Prevacid.      Questionable dysphagia. Rule out peptic stricture.  3. History of incidental diverticulosis. Otherwise, negative      colonoscopy December 2002.  4. General medical problems   RECOMMENDATIONS:  1. Continue Prevacid.  2. Laboratories today including CBC, comprehensive metabolic panel,      amylase and lipase.  3. Schedule abdominal ultrasound to rule out gallstones.  4. Schedule upper endoscopy to evaluate pain and possible dysphagia.     Jasmin Bonito. Marina Goodell, MD  Electronically Signed    JNP/MedQ  DD: 03/17/2007  DT: 03/18/2007  Job #: [id number]   cc:   Jasmin Quint. Plotnikov, MD

## 2011-01-05 NOTE — Telephone Encounter (Signed)
Pt cannot afford the copay for the moviprep. Pt informed that we have a sample and she can come to the office and pick it up. Pt verbalized understanding.

## 2011-01-05 NOTE — Assessment & Plan Note (Signed)
[facility]                         GASTROENTEROLOGY OFFICE NOTE   NAME:Pettingill, KLARYSSA FAUTH                  MRN:          [id number]  DATE:03/21/2007                            DOB:          31-Jul-1934    SESSION NUMBER:  [id number]   As per template.  Aorta normal.  Diameter 2.5 cm.   The IVC is patent.   The head and body of the pancreas appeared normal, but the tail is not  well-visualized.   Gallbladder normal.  Wall thickness 2.6 mm.   Common duct normal.  Diameter 5.4 mm.   The liver generally appears normal, except for multiple cysts noted.  The largest cyst measures 52 x 41 x 62 mm in the left lobe of the liver.   Kidneys:  Right 10 cm, left 11.5 cm.  There are multiple peripelvic  renal cysts noted.   Spleen:  Normal.  Length 7.3 cm.   ASSESSMENT:  1. No evidence of cholelithiasis or biliary ductal dilatation.  2. Incomplete pancreatic visualization.  3. Multiple liver cysts with the larger 5 x 6 cm cyst in the left      lobe.  4. Peripelvic right renal cysts noted.     Vania Rea. Jarold Motto, MD, Caleen Essex, FAGA  Electronically Signed    DRP/MedQ  DD: 03/21/2007  DT: 03/22/2007  Job #: [id number]   cc:   Tama Headings. Marina Goodell, M.D.

## 2011-01-07 ENCOUNTER — Encounter: Payer: Self-pay | Admitting: Internal Medicine

## 2011-01-07 ENCOUNTER — Ambulatory Visit (AMBULATORY_SURGERY_CENTER): Payer: Medicare Other | Admitting: Internal Medicine

## 2011-01-07 VITALS — BP 125/55 | HR 70 | Temp 97.4°F | Resp 18 | Ht 67.0 in | Wt 212.0 lb

## 2011-01-07 DIAGNOSIS — D126 Benign neoplasm of colon, unspecified: Secondary | ICD-10-CM

## 2011-01-07 DIAGNOSIS — K5730 Diverticulosis of large intestine without perforation or abscess without bleeding: Secondary | ICD-10-CM

## 2011-01-07 DIAGNOSIS — Z86010 Personal history of colonic polyps: Secondary | ICD-10-CM

## 2011-01-07 DIAGNOSIS — R1011 Right upper quadrant pain: Secondary | ICD-10-CM

## 2011-01-07 DIAGNOSIS — Z1211 Encounter for screening for malignant neoplasm of colon: Secondary | ICD-10-CM

## 2011-01-07 MED ORDER — SODIUM CHLORIDE 0.9 % IV SOLN: 500.0000 mL | INTRAVENOUS | Status: DC

## 2011-01-07 NOTE — Patient Instructions (Signed)
One small polyp was removed from your colon today.  Moderate diverticulosis was also seen.  You will receive a letter in the mail within 1-2 weeks with the polyp results.  Informational handouts were given to your care partner on polyps, diverticulosis and high fiber diet.  Repeat colonoscopy in 3 years 2015 per Dr. Marina Goodell.  Resume your prior medications today.  Please call if any questions or concerns.

## 2011-01-08 ENCOUNTER — Telehealth: Payer: Self-pay

## 2011-01-08 NOTE — Telephone Encounter (Signed)

## 2011-01-08 NOTE — Assessment & Plan Note (Signed)
[facility]                                 ON-CALL NOTE   MIJA, EFFERTZ                    MRN:          [id number]  DATE:01/14/2007                            DOB:          1934/06/20    Phone number [phone].  Phone call at 9:07 a.m. on Jan 14, 2007.   Ms. Annalee Genta was troubled with chest and abdominal pain, wound up going  to an Urgent Care several days ago, and wound up having a stress test at  [facility] yesterday.  She had not heard those results, but was sent home  without any signs of a problem.  She was diagnosed with gastroesophageal  reflux.  Told to take Zantac, and that seemed to be helping her, until  she held it for the stress test yesterday.  She woke up in the middle of  the night with the chest pain again at about 4 a.m., took the Zantac  then, but is still having some problems right now.  She is not having  trouble breathing, or other associated symptoms.   PLAN:  It sounds like she is having GI symptoms.  Obviously, there were  no critical findings on the stress test, or they would not have sent her  home.  So, I told her to start Prilosec instead of the Zantac, and to  start the 1st dose as soon as possible.  We will wait for the results of  the stress test, but did not sound like evaluation today was going to  add anything at this point.     Karie Schwalbe, MD  Electronically Signed    RIL/MedQ  DD: 01/14/2007  DT: 01/14/2007  Job #: [id number]   cc:   Georgina Quint. Plotnikov, MD

## 2011-02-01 ENCOUNTER — Other Ambulatory Visit: Payer: Self-pay | Admitting: Obstetrics and Gynecology

## 2011-02-01 DIAGNOSIS — Z1231 Encounter for screening mammogram for malignant neoplasm of breast: Secondary | ICD-10-CM

## 2011-02-11 ENCOUNTER — Ambulatory Visit
Admission: RE | Admit: 2011-02-11 | Discharge: 2011-02-11 | Disposition: A | Discharge status: Home / Self Care | Payer: Medicare Other | Source: Ambulatory Visit | Attending: Obstetrics and Gynecology | Admitting: Obstetrics and Gynecology

## 2011-02-11 DIAGNOSIS — Z1231 Encounter for screening mammogram for malignant neoplasm of breast: Secondary | ICD-10-CM

## 2011-02-15 ENCOUNTER — Other Ambulatory Visit: Payer: Self-pay | Admitting: Obstetrics and Gynecology

## 2011-02-15 DIAGNOSIS — R928 Other abnormal and inconclusive findings on diagnostic imaging of breast: Secondary | ICD-10-CM

## 2011-02-19 ENCOUNTER — Ambulatory Visit
Admission: RE | Admit: 2011-02-19 | Discharge: 2011-02-19 | Disposition: A | Discharge status: Home / Self Care | Payer: Medicare Other | Source: Ambulatory Visit | Attending: Obstetrics and Gynecology | Admitting: Obstetrics and Gynecology

## 2011-02-19 DIAGNOSIS — R928 Other abnormal and inconclusive findings on diagnostic imaging of breast: Secondary | ICD-10-CM

## 2011-05-20 ENCOUNTER — Encounter: Payer: Self-pay | Admitting: Internal Medicine

## 2011-05-20 ENCOUNTER — Other Ambulatory Visit (INDEPENDENT_AMBULATORY_CARE_PROVIDER_SITE_OTHER): Payer: Medicare Other

## 2011-05-20 ENCOUNTER — Ambulatory Visit (INDEPENDENT_AMBULATORY_CARE_PROVIDER_SITE_OTHER): Payer: Medicare Other | Admitting: Internal Medicine

## 2011-05-20 DIAGNOSIS — K649 Unspecified hemorrhoids: Secondary | ICD-10-CM

## 2011-05-20 DIAGNOSIS — I10 Essential (primary) hypertension: Secondary | ICD-10-CM

## 2011-05-20 DIAGNOSIS — E785 Hyperlipidemia, unspecified: Secondary | ICD-10-CM

## 2011-05-20 DIAGNOSIS — E538 Deficiency of other specified B group vitamins: Secondary | ICD-10-CM

## 2011-05-20 DIAGNOSIS — K648 Other hemorrhoids: Secondary | ICD-10-CM | POA: Insufficient documentation

## 2011-05-20 DIAGNOSIS — Z23 Encounter for immunization: Secondary | ICD-10-CM

## 2011-05-20 DIAGNOSIS — E559 Vitamin D deficiency, unspecified: Secondary | ICD-10-CM

## 2011-05-20 LAB — CBC WITH DIFFERENTIAL/PLATELET
Basophils Relative: 0.4 % (ref 0.0–3.0)
Eosinophils Relative: 2.1 % (ref 0.0–5.0)
HCT: 40.2 % (ref 36.0–46.0)
Hemoglobin: 13.7 g/dL (ref 12.0–15.0)
Lymphs Abs: 1.4 10*3/uL (ref 0.7–4.0)
MCV: 93.7 fl (ref 78.0–100.0)
Monocytes Absolute: 0.3 10*3/uL (ref 0.1–1.0)
Monocytes Relative: 10.4 % (ref 3.0–12.0)
Neutro Abs: 1 10*3/uL — ABNORMAL LOW (ref 1.4–7.7)
Platelets: 132 10*3/uL — ABNORMAL LOW (ref 150.0–400.0)
WBC: 2.7 10*3/uL — ABNORMAL LOW (ref 4.5–10.5)

## 2011-05-20 MED ORDER — HYDROCORTISONE ACETATE 25 MG RE SUPP: 25.0000 mg | Freq: Two times a day (BID) | RECTAL | Status: DC

## 2011-05-20 NOTE — Assessment & Plan Note (Signed)
Continue with current prescription therapy as reflected on the Med list.  

## 2011-05-20 NOTE — Assessment & Plan Note (Signed)
Anusol hc prn supp

## 2011-05-20 NOTE — Progress Notes (Signed)
  Subjective:    Patient ID: Jasmin [name], female    DOB: 07-19-1934, 77 y.o.   MRN: [id number]  HPI  The patient presents for a follow-up of  chronic hypertension, chronic dyslipidemia,  GERD, controlled with medicines. C/o occ rectal bleeding from hemorrhoids    Review of Systems  Constitutional: Negative for chills, activity change, appetite change, fatigue and unexpected weight change.  HENT: Negative for congestion, mouth sores and sinus pressure.   Eyes: Negative for visual disturbance.  Respiratory: Negative for cough and chest tightness.   Gastrointestinal: Positive for anal bleeding. Negative for nausea and abdominal pain.  Genitourinary: Negative for frequency, difficulty urinating and vaginal pain.  Musculoskeletal: Negative for back pain and gait problem.  Skin: Negative for pallor and rash.  Neurological: Negative for dizziness, tremors, weakness, numbness and headaches.  Psychiatric/Behavioral: Negative for confusion and sleep disturbance.       Objective:   Physical Exam  Constitutional: She appears well-developed. No distress.       obese  HENT:  Head: Normocephalic.  Right Ear: External ear normal.  Left Ear: External ear normal.  Nose: Nose normal.  Mouth/Throat: Oropharynx is clear and moist.  Eyes: Conjunctivae are normal. Pupils are equal, round, and reactive to light. Right eye exhibits no discharge. Left eye exhibits no discharge.  Neck: Normal range of motion. Neck supple. No JVD present. No tracheal deviation present. No thyromegaly present.  Cardiovascular: Normal rate, regular rhythm and normal heart sounds.   Pulmonary/Chest: No stridor. No respiratory distress. She has no wheezes.  Abdominal: Soft. Bowel sounds are normal. She exhibits no distension and no mass. There is no tenderness. There is no rebound and no guarding.  Musculoskeletal: She exhibits no edema and no tenderness.  Lymphadenopathy:    She has no cervical adenopathy.    Neurological: She displays normal reflexes. No cranial nerve deficit. She exhibits normal muscle tone. Coordination normal.  Skin: No rash noted. No erythema.  Psychiatric: She has a normal mood and affect. Her behavior is normal. Judgment and thought content normal.          Assessment & Plan:

## 2011-05-21 ENCOUNTER — Telehealth: Payer: Self-pay | Admitting: Internal Medicine

## 2011-05-21 LAB — COMPREHENSIVE METABOLIC PANEL
ALT: 17 U/L (ref 0–35)
Albumin: 4 g/dL (ref 3.5–5.2)
BUN: 18 mg/dL (ref 6–23)
CO2: 29 mEq/L (ref 19–32)
Calcium: 9 mg/dL (ref 8.4–10.5)
Chloride: 105 mEq/L (ref 96–112)
Creatinine, Ser: 1.1 mg/dL (ref 0.4–1.2)
GFR: 60.3 mL/min (ref >60.00)
Potassium: 4.1 mEq/L (ref 3.5–5.1)

## 2011-05-21 LAB — LIPID PANEL: HDL: 54.4 mg/dL (ref >39.00)

## 2011-05-21 LAB — VITAMIN D 25 HYDROXY (VIT D DEFICIENCY, FRACTURES): Vit D, 25-Hydroxy: 44 ng/mL (ref 30–89)

## 2011-05-21 NOTE — Telephone Encounter (Signed)
Pt's husband informed.

## 2011-05-21 NOTE — Telephone Encounter (Signed)
Jasmin [name], please, inform patient that all labs are normal except for some previous cbc abnormalities we've been watching Thx

## 2011-06-07 ENCOUNTER — Other Ambulatory Visit: Payer: Self-pay | Admitting: Internal Medicine

## 2011-09-07 ENCOUNTER — Other Ambulatory Visit: Payer: Self-pay | Admitting: *Deleted

## 2011-09-07 MED ORDER — LOVASTATIN 40 MG PO TABS: 40.0000 mg | ORAL_TABLET | Freq: Every day | ORAL | Status: DC

## 2011-09-07 MED ORDER — TRIAMTERENE-HCTZ 37.5-25 MG PO TABS: 1.0000 | ORAL_TABLET | Freq: Every day | ORAL | Status: DC

## 2011-09-07 MED ORDER — RANITIDINE HCL 150 MG PO CAPS: 150.0000 mg | ORAL_CAPSULE | Freq: Two times a day (BID) | ORAL | Status: DC

## 2011-09-07 MED ORDER — OMEPRAZOLE 20 MG PO CPDR: 20.0000 mg | DELAYED_RELEASE_CAPSULE | Freq: Every day | ORAL | Status: DC

## 2011-09-07 NOTE — Progress Notes (Signed)
Addended by: Merrilyn Puma on: 09/07/2011 11:31 AM   Modules accepted: Orders, Medications

## 2011-11-18 ENCOUNTER — Ambulatory Visit: Payer: Medicare Other | Admitting: Internal Medicine

## 2011-11-24 ENCOUNTER — Encounter: Payer: Self-pay | Admitting: Internal Medicine

## 2011-11-24 ENCOUNTER — Ambulatory Visit (INDEPENDENT_AMBULATORY_CARE_PROVIDER_SITE_OTHER): Payer: Medicare Other | Admitting: Internal Medicine

## 2011-11-24 ENCOUNTER — Other Ambulatory Visit (INDEPENDENT_AMBULATORY_CARE_PROVIDER_SITE_OTHER): Payer: Medicare Other

## 2011-11-24 VITALS — BP 122/72 | HR 56 | Temp 97.5°F | Ht 65.0 in | Wt 212.0 lb

## 2011-11-24 DIAGNOSIS — E559 Vitamin D deficiency, unspecified: Secondary | ICD-10-CM

## 2011-11-24 DIAGNOSIS — I10 Essential (primary) hypertension: Secondary | ICD-10-CM

## 2011-11-24 DIAGNOSIS — R413 Other amnesia: Secondary | ICD-10-CM

## 2011-11-24 DIAGNOSIS — E538 Deficiency of other specified B group vitamins: Secondary | ICD-10-CM

## 2011-11-24 LAB — VITAMIN B12: Vitamin B-12: 631 pg/mL (ref 211–911)

## 2011-11-24 LAB — BASIC METABOLIC PANEL
BUN: 22 mg/dL (ref 6–23)
Calcium: 9.5 mg/dL (ref 8.4–10.5)
Creatinine, Ser: 1 mg/dL (ref 0.4–1.2)
GFR: 71.81 mL/min (ref >60.00)
Glucose, Bld: 97 mg/dL (ref 70–99)

## 2011-11-24 LAB — URINALYSIS
Bilirubin Urine: NEGATIVE
Ketones, ur: NEGATIVE
Leukocytes, UA: NEGATIVE
Urobilinogen, UA: 0.2 (ref 0.0–1.0)

## 2011-11-24 LAB — LIPID PANEL
Cholesterol: 182 mg/dL (ref 0–200)
HDL: 55.2 mg/dL (ref >39.00)
VLDL: 21 mg/dL (ref 0.0–40.0)

## 2011-11-24 LAB — HEPATIC FUNCTION PANEL
Albumin: 3.9 g/dL (ref 3.5–5.2)
Alkaline Phosphatase: 51 U/L (ref 39–117)

## 2011-11-24 LAB — TSH: TSH: 1.19 u[IU]/mL (ref 0.35–5.50)

## 2011-11-24 NOTE — Assessment & Plan Note (Signed)
Continue with current prescription therapy as reflected on the Med list.  

## 2011-11-24 NOTE — Patient Instructions (Signed)
Hold lovastatin x  1 month

## 2011-11-24 NOTE — Assessment & Plan Note (Signed)
Hold lovastatin x 1 mo

## 2011-11-24 NOTE — Progress Notes (Signed)
Patient ID: Jasmin [name], female   DOB: 12-May-1934, 78 y.o.   MRN: [id number]  Subjective:    Patient ID: Jasmin [name], female    DOB: 1934-01-08, 78 y.o.   MRN: [id number]  HPI  The patient presents for a follow-up of  chronic hypertension, chronic dyslipidemia,  GERD, controlled with medicines. C/o occ memory issues lately    Review of Systems  Constitutional: Negative for chills, activity change, appetite change, fatigue and unexpected weight change.  HENT: Negative for congestion, mouth sores and sinus pressure.   Eyes: Negative for visual disturbance.  Respiratory: Negative for cough and chest tightness.   Gastrointestinal: Negative for nausea, abdominal pain and anal bleeding.  Genitourinary: Negative for frequency, difficulty urinating and vaginal pain.  Musculoskeletal: Negative for back pain and gait problem.  Skin: Negative for pallor and rash.  Neurological: Negative for dizziness, tremors, weakness, numbness and headaches.  Psychiatric/Behavioral: Negative for suicidal ideas, confusion and sleep disturbance. The patient is not nervous/anxious.        Objective:   Physical Exam  Constitutional: She appears well-developed. No distress.       obese  HENT:  Head: Normocephalic.  Right Ear: External ear normal.  Left Ear: External ear normal.  Nose: Nose normal.  Mouth/Throat: Oropharynx is clear and moist.  Eyes: Conjunctivae are normal. Pupils are equal, round, and reactive to light. Right eye exhibits no discharge. Left eye exhibits no discharge.  Neck: Normal range of motion. Neck supple. No JVD present. No tracheal deviation present. No thyromegaly present.  Cardiovascular: Normal rate, regular rhythm and normal heart sounds.   Pulmonary/Chest: No stridor. No respiratory distress. She has no wheezes.  Abdominal: Soft. Bowel sounds are normal. She exhibits no distension and no mass. There is no tenderness. There is no rebound and no guarding.    Musculoskeletal: She exhibits no edema and no tenderness.  Lymphadenopathy:    She has no cervical adenopathy.  Neurological: She displays normal reflexes. No cranial nerve deficit. She exhibits normal muscle tone. Coordination normal.  Skin: No rash noted. No erythema.  Psychiatric: She has a normal mood and affect. Her behavior is normal. Judgment and thought content normal.          Assessment & Plan:

## 2011-11-24 NOTE — Assessment & Plan Note (Signed)
Continue with current prescription therapy as reflected on the Med list. Check labs 

## 2011-11-29 ENCOUNTER — Telehealth: Payer: Self-pay | Admitting: *Deleted

## 2011-11-29 NOTE — Telephone Encounter (Signed)
Message copied by Merrilyn Puma on Mon Nov 29, 2011  5:08 PM ------      Message from: Janeal Holmes      Created: Wed Nov 24, 2011  3:45 PM       Misty Stanley, please, inform patient that all labs are OK      Thank you!

## 2011-11-29 NOTE — Telephone Encounter (Signed)
Called pt no answer/machine.   Will try again later.

## 2011-11-30 NOTE — Telephone Encounter (Signed)
Pt informed

## 2011-12-07 ENCOUNTER — Other Ambulatory Visit: Payer: Self-pay | Admitting: *Deleted

## 2011-12-07 MED ORDER — METOPROLOL TARTRATE 50 MG PO TABS: 50.0000 mg | ORAL_TABLET | Freq: Two times a day (BID) | ORAL | Status: DC

## 2012-03-06 ENCOUNTER — Other Ambulatory Visit: Payer: Self-pay | Admitting: Internal Medicine

## 2012-03-06 DIAGNOSIS — Z1231 Encounter for screening mammogram for malignant neoplasm of breast: Secondary | ICD-10-CM

## 2012-03-10 ENCOUNTER — Ambulatory Visit (INDEPENDENT_AMBULATORY_CARE_PROVIDER_SITE_OTHER): Payer: Medicare Other | Admitting: Internal Medicine

## 2012-03-10 ENCOUNTER — Telehealth: Payer: Self-pay | Admitting: Internal Medicine

## 2012-03-10 ENCOUNTER — Other Ambulatory Visit (INDEPENDENT_AMBULATORY_CARE_PROVIDER_SITE_OTHER): Payer: Medicare Other

## 2012-03-10 ENCOUNTER — Encounter: Payer: Self-pay | Admitting: Internal Medicine

## 2012-03-10 ENCOUNTER — Ambulatory Visit (INDEPENDENT_AMBULATORY_CARE_PROVIDER_SITE_OTHER)
Admission: RE | Admit: 2012-03-10 | Discharge: 2012-03-10 | Disposition: A | Discharge status: Home / Self Care | Payer: Medicare Other | Source: Ambulatory Visit | Attending: Internal Medicine | Admitting: Internal Medicine

## 2012-03-10 VITALS — BP 122/70 | HR 62 | Temp 98.4°F | Resp 16 | Wt 214.0 lb

## 2012-03-10 DIAGNOSIS — M545 Low back pain, unspecified: Secondary | ICD-10-CM | POA: Insufficient documentation

## 2012-03-10 DIAGNOSIS — I10 Essential (primary) hypertension: Secondary | ICD-10-CM

## 2012-03-10 DIAGNOSIS — E785 Hyperlipidemia, unspecified: Secondary | ICD-10-CM

## 2012-03-10 DIAGNOSIS — R0600 Dyspnea, unspecified: Secondary | ICD-10-CM

## 2012-03-10 DIAGNOSIS — E559 Vitamin D deficiency, unspecified: Secondary | ICD-10-CM

## 2012-03-10 DIAGNOSIS — R0989 Other specified symptoms and signs involving the circulatory and respiratory systems: Secondary | ICD-10-CM

## 2012-03-10 DIAGNOSIS — R609 Edema, unspecified: Secondary | ICD-10-CM

## 2012-03-10 DIAGNOSIS — E538 Deficiency of other specified B group vitamins: Secondary | ICD-10-CM

## 2012-03-10 DIAGNOSIS — R0609 Other forms of dyspnea: Secondary | ICD-10-CM

## 2012-03-10 LAB — HEPATIC FUNCTION PANEL
AST: 22 U/L (ref 0–37)
Bilirubin, Direct: 0.1 mg/dL (ref 0.0–0.3)
Total Bilirubin: 0.6 mg/dL (ref 0.3–1.2)

## 2012-03-10 LAB — URINALYSIS
Leukocytes, UA: NEGATIVE
Nitrite: NEGATIVE
Specific Gravity, Urine: 1.02 (ref 1.000–1.030)
Urobilinogen, UA: 0.2 (ref 0.0–1.0)

## 2012-03-10 LAB — CBC WITH DIFFERENTIAL/PLATELET
Basophils Absolute: 0 10*3/uL (ref 0.0–0.1)
Basophils Relative: 0.3 % (ref 0.0–3.0)
Eosinophils Absolute: 0.1 10*3/uL (ref 0.0–0.7)
MCHC: 34.5 g/dL (ref 30.0–36.0)
MCV: 92.5 fl (ref 78.0–100.0)
Monocytes Absolute: 0.3 10*3/uL (ref 0.1–1.0)
Neutrophils Relative %: 40.3 % — ABNORMAL LOW (ref 43.0–77.0)
RBC: 4.21 Mil/uL (ref 3.87–5.11)
RDW: 14.9 % — ABNORMAL HIGH (ref 11.5–14.6)

## 2012-03-10 LAB — BRAIN NATRIURETIC PEPTIDE: Pro B Natriuretic peptide (BNP): 29 pg/mL (ref 0.0–100.0)

## 2012-03-10 LAB — BASIC METABOLIC PANEL
BUN: 21 mg/dL (ref 6–23)
Creatinine, Ser: 1.2 mg/dL (ref 0.4–1.2)
GFR: 57.8 mL/min — ABNORMAL LOW (ref >60.00)
Potassium: 3.8 mEq/L (ref 3.5–5.1)

## 2012-03-10 LAB — SEDIMENTATION RATE: Sed Rate: 16 mm/hr (ref 0–22)

## 2012-03-10 LAB — VITAMIN B12: Vitamin B-12: 511 pg/mL (ref 211–911)

## 2012-03-10 NOTE — Telephone Encounter (Signed)
Pt informed

## 2012-03-10 NOTE — Assessment & Plan Note (Signed)
Continue with current prescription therapy as reflected on the Med list.  

## 2012-03-10 NOTE — Telephone Encounter (Signed)
Jasmin [name], please, inform patient that all labs and CXR are unchanged from before overall. Call if not better Thx

## 2012-03-10 NOTE — Assessment & Plan Note (Signed)
Cont diuretic

## 2012-03-10 NOTE — Assessment & Plan Note (Signed)
7/13 R ?etiol Labs, UA

## 2012-03-10 NOTE — Assessment & Plan Note (Signed)
CXR BNP

## 2012-03-10 NOTE — Progress Notes (Signed)
Subjective:    Patient ID: Jasmin [name], female    DOB: 10/08/1933, 78 y.o.   MRN: [id number]  Back Pain This is a new problem. The current episode started in the past 7 days. The problem has been gradually worsening since onset. The pain is present in the lumbar spine (R). The quality of the pain is described as aching. The pain does not radiate. The pain is mild. The pain is worse during the day. The symptoms are aggravated by bending and position. Pertinent negatives include no abdominal pain, headaches, numbness or weakness. She has tried nothing for the symptoms.  Shortness of Breath This is a new problem. The current episode started in the past 7 days. The problem occurs intermittently. The problem has been unchanged. Pertinent negatives include no abdominal pain, headaches or rash. Nothing aggravates the symptoms. She has tried nothing for the symptoms. The treatment provided no relief.   C/o LBP since mon and SOB since Wed C/o B feet swelling  The patient presents for a follow-up of  chronic hypertension, chronic dyslipidemia,  GERD, controlled with medicines. C/o occ memory issues lately  Wt Readings from Last 3 Encounters:  03/10/12 214 lb (97.07 kg)  11/24/11 212 lb (96.163 kg)  05/20/11 213 lb (96.616 kg)   BP Readings from Last 3 Encounters:  03/10/12 122/70  11/24/11 122/72  05/20/11 130/80      Review of Systems  Constitutional: Negative for chills, activity change, appetite change, fatigue and unexpected weight change.  HENT: Negative for congestion, mouth sores and sinus pressure.   Eyes: Negative for visual disturbance.  Respiratory: Positive for shortness of breath. Negative for cough and chest tightness.   Gastrointestinal: Negative for nausea, abdominal pain and anal bleeding.  Genitourinary: Negative for frequency, difficulty urinating and vaginal pain.  Musculoskeletal: Positive for back pain. Negative for gait problem.  Skin: Negative for pallor and  rash.  Neurological: Negative for dizziness, tremors, weakness, numbness and headaches.  Psychiatric/Behavioral: Negative for suicidal ideas, confusion and disturbed wake/sleep cycle. The patient is not nervous/anxious.        Objective:   Physical Exam  Constitutional: She appears well-developed. No distress.       obese  HENT:  Head: Normocephalic.  Right Ear: External ear normal.  Left Ear: External ear normal.  Nose: Nose normal.  Mouth/Throat: Oropharynx is clear and moist.  Eyes: Conjunctivae are normal. Pupils are equal, round, and reactive to light. Right eye exhibits no discharge. Left eye exhibits no discharge.  Neck: Normal range of motion. Neck supple. No JVD present. No tracheal deviation present. No thyromegaly present.  Cardiovascular: Normal rate, regular rhythm and normal heart sounds.   Pulmonary/Chest: No stridor. No respiratory distress. She has no wheezes.  Abdominal: Soft. Bowel sounds are normal. She exhibits no distension and no mass. There is no tenderness. There is no rebound and no guarding.  Musculoskeletal: She exhibits no edema and no tenderness.  Lymphadenopathy:    She has no cervical adenopathy.  Neurological: She displays normal reflexes. No cranial nerve deficit. She exhibits normal muscle tone. Coordination normal.  Skin: No rash noted. No erythema.  Psychiatric: She has a normal mood and affect. Her behavior is normal. Judgment and thought content normal.    Lab Results  Component Value Date   WBC 2.7* 05/20/2011   HGB 13.7 05/20/2011   HCT 40.2 05/20/2011   PLT 132.0* 05/20/2011   GLUCOSE 97 11/24/2011   CHOL 182 11/24/2011   TRIG 105.0  11/24/2011   HDL 55.20 11/24/2011   LDLCALC 106* 11/24/2011   ALT 16 11/24/2011   AST 19 11/24/2011   NA 141 11/24/2011   K 4.1 11/24/2011   CL 102 11/24/2011   CREATININE 1.0 11/24/2011   BUN 22 11/24/2011   CO2 28 11/24/2011   TSH 1.19 11/24/2011   INR 1.16 08/16/2009         Assessment & Plan:

## 2012-03-11 LAB — VITAMIN D 25 HYDROXY (VIT D DEFICIENCY, FRACTURES): Vit D, 25-Hydroxy: 40 ng/mL (ref 30–89)

## 2012-03-13 ENCOUNTER — Ambulatory Visit
Admission: RE | Admit: 2012-03-13 | Discharge: 2012-03-13 | Disposition: A | Discharge status: Home / Self Care | Payer: Medicare Other | Source: Ambulatory Visit | Attending: Internal Medicine | Admitting: Internal Medicine

## 2012-03-13 DIAGNOSIS — Z1231 Encounter for screening mammogram for malignant neoplasm of breast: Secondary | ICD-10-CM

## 2012-03-17 ENCOUNTER — Ambulatory Visit (INDEPENDENT_AMBULATORY_CARE_PROVIDER_SITE_OTHER): Payer: Medicare Other | Admitting: Internal Medicine

## 2012-03-17 ENCOUNTER — Encounter: Payer: Self-pay | Admitting: Internal Medicine

## 2012-03-17 VITALS — BP 120/84 | HR 88 | Temp 97.0°F | Resp 16 | Wt 215.0 lb

## 2012-03-17 DIAGNOSIS — R0600 Dyspnea, unspecified: Secondary | ICD-10-CM

## 2012-03-17 DIAGNOSIS — R0609 Other forms of dyspnea: Secondary | ICD-10-CM

## 2012-03-17 DIAGNOSIS — I10 Essential (primary) hypertension: Secondary | ICD-10-CM

## 2012-03-17 DIAGNOSIS — E538 Deficiency of other specified B group vitamins: Secondary | ICD-10-CM

## 2012-03-17 DIAGNOSIS — R609 Edema, unspecified: Secondary | ICD-10-CM

## 2012-03-17 DIAGNOSIS — M545 Low back pain: Secondary | ICD-10-CM

## 2012-03-17 DIAGNOSIS — T23009A Burn of unspecified degree of unspecified hand, unspecified site, initial encounter: Secondary | ICD-10-CM | POA: Insufficient documentation

## 2012-03-17 NOTE — Assessment & Plan Note (Signed)
Resolved

## 2012-03-17 NOTE — Progress Notes (Signed)
Patient ID: Jasmin [name], female   DOB: 08-16-1934, 78 y.o.   MRN: [id number]   Subjective:    Patient ID: Jasmin [name], female    DOB: [birth year], 78 y.o.   MRN: [id number]  HPI F/u LBP since mon and SOB since Wed 2wks ago - resolved F/u B feet swelling - resolved She burned her R hand  The patient presents for a follow-up of  chronic hypertension, chronic dyslipidemia,  GERD, controlled with medicines. C/o occ memory issues lately  Wt Readings from Last 3 Encounters:  03/17/12 215 lb (97.523 kg)  03/10/12 214 lb (97.07 kg)  11/24/11 212 lb (96.163 kg)   BP Readings from Last 3 Encounters:  03/17/12 120/84  03/10/12 122/70  11/24/11 122/72      Review of Systems  Constitutional: Negative for chills, activity change, appetite change, fatigue and unexpected weight change.  HENT: Negative for congestion, mouth sores and sinus pressure.   Eyes: Negative for visual disturbance.  Respiratory: Negative for cough and chest tightness.   Gastrointestinal: Negative for nausea and anal bleeding.  Genitourinary: Negative for frequency, difficulty urinating and vaginal pain.  Musculoskeletal: Negative for gait problem.  Skin: Negative for pallor.  Neurological: Negative for dizziness and tremors.  Psychiatric/Behavioral: Negative for suicidal ideas, confusion and disturbed wake/sleep cycle. The patient is not nervous/anxious.        Objective:   Physical Exam  Constitutional: She appears well-developed. No distress.       obese  HENT:  Head: Normocephalic.  Right Ear: External ear normal.  Left Ear: External ear normal.  Nose: Nose normal.  Mouth/Throat: Oropharynx is clear and moist.  Eyes: Conjunctivae are normal. Pupils are equal, round, and reactive to light. Right eye exhibits no discharge. Left eye exhibits no discharge.  Neck: Normal range of motion. Neck supple. No JVD present. No tracheal deviation present. No thyromegaly present.  Cardiovascular: Normal  rate, regular rhythm and normal heart sounds.   Pulmonary/Chest: No stridor. No respiratory distress. She has no wheezes.  Abdominal: Soft. Bowel sounds are normal. She exhibits no distension and no mass. There is no tenderness. There is no rebound and no guarding.  Musculoskeletal: She exhibits no edema and no tenderness.  Lymphadenopathy:    She has no cervical adenopathy.  Neurological: She displays normal reflexes. No cranial nerve deficit. She exhibits normal muscle tone. Coordination normal.  Skin: No rash noted. No erythema.  Psychiatric: She has a normal mood and affect. Her behavior is normal. Judgment and thought content normal.  R hand w/two blisters<1 cm  Lab Results  Component Value Date   WBC 3.1* 03/10/2012   HGB 13.4 03/10/2012   HCT 39.0 03/10/2012   PLT 138.0* 03/10/2012   GLUCOSE 100* 03/10/2012   CHOL 182 11/24/2011   TRIG 105.0 11/24/2011   HDL 55.20 11/24/2011   LDLCALC 106* 11/24/2011   ALT 18 03/10/2012   AST 22 03/10/2012   NA 139 03/10/2012   K 3.8 03/10/2012   CL 103 03/10/2012   CREATININE 1.2 03/10/2012   BUN 21 03/10/2012   CO2 28 03/10/2012   TSH 1.07 03/10/2012   INR 1.16 08/16/2009         Assessment & Plan:

## 2012-03-17 NOTE — Assessment & Plan Note (Signed)
Continue with current prescription therapy as reflected on the Med list.  

## 2012-03-17 NOTE — Assessment & Plan Note (Signed)
Given instructions

## 2012-03-20 ENCOUNTER — Ambulatory Visit: Payer: Medicare Other

## 2012-05-31 ENCOUNTER — Encounter: Payer: Self-pay | Admitting: Internal Medicine

## 2012-05-31 ENCOUNTER — Ambulatory Visit (INDEPENDENT_AMBULATORY_CARE_PROVIDER_SITE_OTHER): Payer: Medicare Other | Admitting: Internal Medicine

## 2012-05-31 ENCOUNTER — Other Ambulatory Visit (INDEPENDENT_AMBULATORY_CARE_PROVIDER_SITE_OTHER): Payer: Medicare Other

## 2012-05-31 VITALS — BP 120/80 | HR 80 | Temp 97.7°F | Resp 16 | Ht 66.0 in | Wt 219.0 lb

## 2012-05-31 DIAGNOSIS — E669 Obesity, unspecified: Secondary | ICD-10-CM

## 2012-05-31 DIAGNOSIS — Z136 Encounter for screening for cardiovascular disorders: Secondary | ICD-10-CM

## 2012-05-31 DIAGNOSIS — I10 Essential (primary) hypertension: Secondary | ICD-10-CM

## 2012-05-31 DIAGNOSIS — Z23 Encounter for immunization: Secondary | ICD-10-CM

## 2012-05-31 DIAGNOSIS — E538 Deficiency of other specified B group vitamins: Secondary | ICD-10-CM

## 2012-05-31 DIAGNOSIS — R413 Other amnesia: Secondary | ICD-10-CM

## 2012-05-31 DIAGNOSIS — E785 Hyperlipidemia, unspecified: Secondary | ICD-10-CM

## 2012-05-31 DIAGNOSIS — M545 Low back pain, unspecified: Secondary | ICD-10-CM

## 2012-05-31 DIAGNOSIS — Z0000 Encounter for general adult medical examination without abnormal findings: Secondary | ICD-10-CM

## 2012-05-31 LAB — BASIC METABOLIC PANEL
Calcium: 9.4 mg/dL (ref 8.4–10.5)
GFR: 61.38 mL/min (ref >60.00)
Glucose, Bld: 86 mg/dL (ref 70–99)
Potassium: 3.9 mEq/L (ref 3.5–5.1)
Sodium: 141 mEq/L (ref 135–145)

## 2012-05-31 LAB — URINALYSIS
Bilirubin Urine: NEGATIVE
Hgb urine dipstick: NEGATIVE
Leukocytes, UA: NEGATIVE
Nitrite: NEGATIVE
Total Protein, Urine: NEGATIVE
pH: 6 (ref 5.0–8.0)

## 2012-05-31 LAB — CBC WITH DIFFERENTIAL/PLATELET
Basophils Absolute: 0 10*3/uL (ref 0.0–0.1)
Eosinophils Relative: 1.3 % (ref 0.0–5.0)
HCT: 41.5 % (ref 36.0–46.0)
Hemoglobin: 13.9 g/dL (ref 12.0–15.0)
Lymphocytes Relative: 55.4 % — ABNORMAL HIGH (ref 12.0–46.0)
Monocytes Relative: 11.4 % (ref 3.0–12.0)
Neutro Abs: 0.9 10*3/uL — ABNORMAL LOW (ref 1.4–7.7)
RBC: 4.35 Mil/uL (ref 3.87–5.11)
RDW: 14.7 % — ABNORMAL HIGH (ref 11.5–14.6)
WBC: 2.8 10*3/uL — ABNORMAL LOW (ref 4.5–10.5)

## 2012-05-31 LAB — HEPATIC FUNCTION PANEL
AST: 20 U/L (ref 0–37)
Albumin: 3.9 g/dL (ref 3.5–5.2)
Total Bilirubin: 0.7 mg/dL (ref 0.3–1.2)

## 2012-05-31 LAB — LIPID PANEL
HDL: 50 mg/dL (ref >39.00)
Total CHOL/HDL Ratio: 5
Triglycerides: 169 mg/dL — ABNORMAL HIGH (ref 0.0–149.0)
VLDL: 33.8 mg/dL (ref 0.0–40.0)

## 2012-05-31 LAB — TSH: TSH: 1.79 u[IU]/mL (ref 0.35–5.50)

## 2012-05-31 NOTE — Assessment & Plan Note (Signed)
Back on lovastatin

## 2012-05-31 NOTE — Progress Notes (Signed)
   Subjective:    Patient ID: Jasmin [name], female    DOB: 1934/04/12, 78 y.o.   MRN: [id number]  HPI The patient is here for a wellness exam. The patient has been doing well overall without major physical or psychological issues going on lately. The patient needs to address  chronic hypertension that has been well controlled with medicines; to address chronic  hyperlipidemia controlled with medicines as well; and to address GERD, B12 def No memory issues  Wt Readings from Last 3 Encounters:  05/31/12 219 lb (99.338 kg)  03/17/12 215 lb (97.523 kg)  03/10/12 214 lb (97.07 kg)   BP Readings from Last 3 Encounters:  05/31/12 120/80  03/17/12 120/84  03/10/12 122/70       Review of Systems  Constitutional: Negative for chills, activity change, appetite change, fatigue and unexpected weight change.  HENT: Negative for congestion, mouth sores and sinus pressure.   Eyes: Negative for visual disturbance.  Respiratory: Negative for cough and chest tightness.   Gastrointestinal: Positive for anal bleeding. Negative for nausea and abdominal pain.  Genitourinary: Negative for frequency, difficulty urinating and vaginal pain.  Musculoskeletal: Negative for back pain and gait problem.  Skin: Negative for pallor and rash.  Neurological: Negative for dizziness, tremors, weakness, numbness and headaches.  Psychiatric/Behavioral: Negative for confusion and disturbed wake/sleep cycle.       Objective:   Physical Exam  Constitutional: She appears well-developed. No distress.       obese  HENT:  Head: Normocephalic.  Right Ear: External ear normal.  Left Ear: External ear normal.  Nose: Nose normal.  Mouth/Throat: Oropharynx is clear and moist.  Eyes: Conjunctivae normal are normal. Pupils are equal, round, and reactive to light. Right eye exhibits no discharge. Left eye exhibits no discharge.  Neck: Normal range of motion. Neck supple. No JVD present. No tracheal deviation present.  No thyromegaly present.  Cardiovascular: Normal rate, regular rhythm and normal heart sounds.   Pulmonary/Chest: No stridor. No respiratory distress. She has no wheezes.  Abdominal: Soft. Bowel sounds are normal. She exhibits no distension and no mass. There is no tenderness. There is no rebound and no guarding.  Musculoskeletal: She exhibits no edema and no tenderness.  Lymphadenopathy:    She has no cervical adenopathy.  Neurological: She displays normal reflexes. No cranial nerve deficit. She exhibits normal muscle tone. Coordination normal.  Skin: No rash noted. No erythema.  Psychiatric: She has a normal mood and affect. Her behavior is normal. Judgment and thought content normal.          Assessment & Plan:

## 2012-05-31 NOTE — Assessment & Plan Note (Signed)
Activities of daily living:  The patient is 100% inedpendent in all ADLs: dressing, toileting, feeding as well as independent mobility  Home safety : The patient has smoke detectors in the home. They wear seatbelts. There is no violence in the home.   There is no risks for hepatitis, STDs or HIV. There is no   history of blood transfusion. They have no travel history to infectious disease endemic areas of the world.  The patient has  seen their dentist in the last 12 month. They have  seen their eye doctor in the last year. They deny  Any major hearing difficulty and have not had audiologic testing in the last year.  They do not  have excessive sun exposure. Discussed the need for sun protection: hats, long sleeves and use of sunscreen if there is significant sun exposure.   Diet: the importance of a healthy diet is discussed. They do have a reasonably healthy  diet.  The patient has a fairly regular exercise program of a mixed nature: walking, yard work, etc.The benefits of regular aerobic exercise were discussed.  Depression screen: there are no signs or vegative symptoms of depression- irritability, change in appetite, anhedonia, sadness/tearfullness.  Cognitive assessment: the patient manages all their financial and personal affairs and is actively engaged. They could relate day,date,year and events; recalled 3/3 objects at 3 minutes  The following portions of the patient's history were reviewed and updated as appropriate: allergies, current medications, past family history, past medical history,  past surgical history, past social history  and problem list.  Vision, hearing, body mass index were assessed and reviewed.   During the course of the visit the patient was educated and counseled about appropriate screening and preventive services including : fall prevention , diabetes screening, nutrition counseling, colorectal cancer screening, and recommended immunizations.

## 2012-05-31 NOTE — Assessment & Plan Note (Signed)
Resolved

## 2012-05-31 NOTE — Assessment & Plan Note (Signed)
Continue with current prescription therapy as reflected on the Med list.  

## 2012-05-31 NOTE — Assessment & Plan Note (Signed)
Continue with current prescription therapy as reflected on the Med list. BP Readings from Last 3 Encounters:  05/31/12 120/80  03/17/12 120/84  03/10/12 122/70

## 2012-05-31 NOTE — Assessment & Plan Note (Signed)
Better now. Back on statin

## 2012-05-31 NOTE — Assessment & Plan Note (Signed)
Wt Readings from Last 3 Encounters:  05/31/12 219 lb (99.338 kg)  03/17/12 215 lb (97.523 kg)  03/10/12 214 lb (97.07 kg)

## 2012-08-13 ENCOUNTER — Other Ambulatory Visit: Payer: Self-pay | Admitting: Internal Medicine

## 2012-09-06 ENCOUNTER — Other Ambulatory Visit: Payer: Self-pay | Admitting: Internal Medicine

## 2012-09-12 ENCOUNTER — Other Ambulatory Visit: Payer: Self-pay | Admitting: Internal Medicine

## 2012-11-01 ENCOUNTER — Encounter: Payer: Self-pay | Admitting: Internal Medicine

## 2012-11-01 ENCOUNTER — Other Ambulatory Visit (INDEPENDENT_AMBULATORY_CARE_PROVIDER_SITE_OTHER): Payer: Medicare Other

## 2012-11-01 ENCOUNTER — Ambulatory Visit (INDEPENDENT_AMBULATORY_CARE_PROVIDER_SITE_OTHER): Payer: Medicare Other | Admitting: Internal Medicine

## 2012-11-01 VITALS — BP 130/78 | HR 80 | Temp 96.9°F | Resp 16 | Wt 221.0 lb

## 2012-11-01 DIAGNOSIS — E538 Deficiency of other specified B group vitamins: Secondary | ICD-10-CM

## 2012-11-01 DIAGNOSIS — R609 Edema, unspecified: Secondary | ICD-10-CM

## 2012-11-01 DIAGNOSIS — R413 Other amnesia: Secondary | ICD-10-CM

## 2012-11-01 DIAGNOSIS — I10 Essential (primary) hypertension: Secondary | ICD-10-CM

## 2012-11-01 DIAGNOSIS — E785 Hyperlipidemia, unspecified: Secondary | ICD-10-CM

## 2012-11-01 DIAGNOSIS — E669 Obesity, unspecified: Secondary | ICD-10-CM

## 2012-11-01 LAB — BASIC METABOLIC PANEL
BUN: 23 mg/dL (ref 6–23)
CO2: 28 mEq/L (ref 19–32)
Chloride: 105 mEq/L (ref 96–112)
Potassium: 3.9 mEq/L (ref 3.5–5.1)

## 2012-11-01 NOTE — Assessment & Plan Note (Signed)
Continue with current prescription therapy as reflected on the Med list.  

## 2012-11-01 NOTE — Assessment & Plan Note (Signed)
Continue with current  therapy as reflected on the Med list.  

## 2012-11-01 NOTE — Assessment & Plan Note (Signed)
Doing better.   

## 2012-11-01 NOTE — Assessment & Plan Note (Signed)
resolved 

## 2012-11-01 NOTE — Progress Notes (Signed)
Patient ID: Jasmin [name], female   DOB: 1934-02-16, 79 y.o.   MRN: [id number]   Subjective:    HPI  The patient needs to address  chronic hypertension that has been well controlled with medicines; to address chronic  hyperlipidemia controlled with medicines as well; and to address GERD, B12 def No memory issues  Wt Readings from Last 3 Encounters:  11/01/12 221 lb (100.245 kg)  05/31/12 219 lb (99.338 kg)  03/17/12 215 lb (97.523 kg)   BP Readings from Last 3 Encounters:  11/01/12 130/78  05/31/12 120/80  03/17/12 120/84       Review of Systems  Constitutional: Negative for chills, activity change, appetite change, fatigue and unexpected weight change.  HENT: Negative for congestion, mouth sores and sinus pressure.   Eyes: Negative for visual disturbance.  Respiratory: Negative for cough and chest tightness.   Gastrointestinal: Positive for anal bleeding. Negative for nausea and abdominal pain.  Genitourinary: Negative for frequency, difficulty urinating and vaginal pain.  Musculoskeletal: Negative for back pain and gait problem.  Skin: Negative for pallor and rash.  Neurological: Negative for dizziness, tremors, weakness, numbness and headaches.  Psychiatric/Behavioral: Negative for confusion and sleep disturbance.       Objective:   Physical Exam  Constitutional: She appears well-developed. No distress.  obese  HENT:  Head: Normocephalic.  Right Ear: External ear normal.  Left Ear: External ear normal.  Nose: Nose normal.  Mouth/Throat: Oropharynx is clear and moist.  Eyes: Conjunctivae are normal. Pupils are equal, round, and reactive to light. Right eye exhibits no discharge. Left eye exhibits no discharge.  Neck: Normal range of motion. Neck supple. No JVD present. No tracheal deviation present. No thyromegaly present.  Cardiovascular: Normal rate, regular rhythm and normal heart sounds.   Pulmonary/Chest: No stridor. No respiratory distress. She has no  wheezes.  Abdominal: Soft. Bowel sounds are normal. She exhibits no distension and no mass. There is no tenderness. There is no rebound and no guarding.  Musculoskeletal: She exhibits no edema and no tenderness.  Lymphadenopathy:    She has no cervical adenopathy.  Neurological: She displays normal reflexes. No cranial nerve deficit. She exhibits normal muscle tone. Coordination normal.  Skin: No rash noted. No erythema.  Psychiatric: She has a normal mood and affect. Her behavior is normal. Judgment and thought content normal.    Lab Results  Component Value Date   WBC 2.8* 05/31/2012   HGB 13.9 05/31/2012   HCT 41.5 05/31/2012   PLT 136.0* 05/31/2012   GLUCOSE 86 05/31/2012   CHOL 225* 05/31/2012   TRIG 169.0* 05/31/2012   HDL 50.00 05/31/2012   LDLDIRECT 139.3 05/31/2012   LDLCALC 106* 11/24/2011   ALT 19 05/31/2012   AST 20 05/31/2012   NA 141 05/31/2012   K 3.9 05/31/2012   CL 104 05/31/2012   CREATININE 1.1 05/31/2012   BUN 18 05/31/2012   CO2 29 05/31/2012   TSH 1.79 05/31/2012   INR 1.16 08/16/2009         Assessment & Plan:

## 2012-11-01 NOTE — Assessment & Plan Note (Signed)
Diet discussed 

## 2012-11-16 ENCOUNTER — Telehealth: Payer: Self-pay | Admitting: Internal Medicine

## 2012-11-16 ENCOUNTER — Ambulatory Visit (INDEPENDENT_AMBULATORY_CARE_PROVIDER_SITE_OTHER): Payer: Medicare Other | Admitting: Internal Medicine

## 2012-11-16 ENCOUNTER — Other Ambulatory Visit (INDEPENDENT_AMBULATORY_CARE_PROVIDER_SITE_OTHER): Payer: Medicare Other

## 2012-11-16 ENCOUNTER — Encounter: Payer: Self-pay | Admitting: Internal Medicine

## 2012-11-16 VITALS — BP 130/70 | HR 76 | Temp 98.0°F | Resp 16 | Wt 222.0 lb

## 2012-11-16 DIAGNOSIS — R300 Dysuria: Secondary | ICD-10-CM | POA: Insufficient documentation

## 2012-11-16 DIAGNOSIS — M545 Low back pain, unspecified: Secondary | ICD-10-CM | POA: Insufficient documentation

## 2012-11-16 LAB — URINALYSIS
Leukocytes, UA: NEGATIVE
Nitrite: NEGATIVE
Specific Gravity, Urine: 1.015 (ref 1.000–1.030)
Urobilinogen, UA: 0.2 (ref 0.0–1.0)

## 2012-11-16 MED ORDER — CIPROFLOXACIN HCL 250 MG PO TABS: 250.0000 mg | ORAL_TABLET | Freq: Two times a day (BID) | ORAL | Status: DC

## 2012-11-16 MED ORDER — PHENAZOPYRIDINE HCL 100 MG PO TABS: 100.0000 mg | ORAL_TABLET | Freq: Three times a day (TID) | ORAL | Status: DC | PRN

## 2012-11-16 NOTE — Telephone Encounter (Signed)
Jasmin [name], please, inform patient that her UA was ok. Take Cipro anyway Thx

## 2012-11-16 NOTE — Progress Notes (Signed)
Subjective:    Back Pain This is a new problem. The current episode started in the past 7 days. The problem occurs 2 to 4 times per day. The problem is unchanged. The pain is present in the lumbar spine (left). The quality of the pain is described as aching. The pain is moderate. Associated symptoms include dysuria. Pertinent negatives include no abdominal pain, headaches, numbness or weakness. The treatment provided no relief.  Urinary Frequency  This is a new problem. The current episode started in the past 7 days. The patient is experiencing no pain. Associated symptoms include flank pain and frequency. Pertinent negatives include no chills or nausea.    The patient needs to address  chronic hypertension that has been well controlled with medicines; to address chronic  hyperlipidemia controlled with medicines as well; and to address GERD, B12 def No memory issues  Wt Readings from Last 3 Encounters:  11/16/12 222 lb (100.699 kg)  11/01/12 221 lb (100.245 kg)  05/31/12 219 lb (99.338 kg)   BP Readings from Last 3 Encounters:  11/16/12 130/70  11/01/12 130/78  05/31/12 120/80       Review of Systems  Constitutional: Negative for chills, activity change, appetite change, fatigue and unexpected weight change.  HENT: Negative for congestion, mouth sores and sinus pressure.   Eyes: Negative for visual disturbance.  Respiratory: Negative for cough and chest tightness.   Gastrointestinal: Positive for anal bleeding. Negative for nausea and abdominal pain.  Genitourinary: Positive for dysuria, frequency and flank pain. Negative for difficulty urinating and vaginal pain.  Musculoskeletal: Positive for back pain. Negative for gait problem.  Skin: Negative for pallor and rash.  Neurological: Negative for dizziness, tremors, weakness, numbness and headaches.  Psychiatric/Behavioral: Negative for confusion and sleep disturbance.       Objective:   Physical Exam  Constitutional: She  appears well-developed. No distress.  obese  HENT:  Head: Normocephalic.  Right Ear: External ear normal.  Left Ear: External ear normal.  Nose: Nose normal.  Mouth/Throat: Oropharynx is clear and moist.  Eyes: Conjunctivae are normal. Pupils are equal, round, and reactive to light. Right eye exhibits no discharge. Left eye exhibits no discharge.  Neck: Normal range of motion. Neck supple. No JVD present. No tracheal deviation present. No thyromegaly present.  Cardiovascular: Normal rate, regular rhythm and normal heart sounds.   Pulmonary/Chest: No stridor. No respiratory distress. She has no wheezes.  Abdominal: Soft. Bowel sounds are normal. She exhibits no distension and no mass. There is no tenderness. There is no rebound and no guarding.  Musculoskeletal: She exhibits no edema and no tenderness.  Lymphadenopathy:    She has no cervical adenopathy.  Neurological: She displays normal reflexes. No cranial nerve deficit. She exhibits normal muscle tone. Coordination normal.  Skin: No rash noted. No erythema.  Psychiatric: She has a normal mood and affect. Her behavior is normal. Judgment and thought content normal.    Lab Results  Component Value Date   WBC 2.8* 05/31/2012   HGB 13.9 05/31/2012   HCT 41.5 05/31/2012   PLT 136.0* 05/31/2012   GLUCOSE 93 11/01/2012   CHOL 225* 05/31/2012   TRIG 169.0* 05/31/2012   HDL 50.00 05/31/2012   LDLDIRECT 139.3 05/31/2012   LDLCALC 106* 11/24/2011   ALT 19 05/31/2012   AST 20 05/31/2012   NA 140 11/01/2012   K 3.9 11/01/2012   CL 105 11/01/2012   CREATININE 1.3* 11/01/2012   BUN 23 11/01/2012   CO2 28  11/01/2012   TSH 1.79 05/31/2012   INR 1.16 08/16/2009         Assessment & Plan:

## 2012-11-16 NOTE — Assessment & Plan Note (Signed)
UA

## 2012-11-16 NOTE — Assessment & Plan Note (Signed)
UA Cipro Pyridium

## 2012-11-20 NOTE — Telephone Encounter (Signed)
Pt informed

## 2013-02-07 ENCOUNTER — Other Ambulatory Visit: Payer: Self-pay

## 2013-02-07 DIAGNOSIS — Z1231 Encounter for screening mammogram for malignant neoplasm of breast: Secondary | ICD-10-CM

## 2013-02-24 ENCOUNTER — Emergency Department (HOSPITAL_COMMUNITY)
Admission: EM | Admit: 2013-02-24 | Discharge: 2013-02-24 | Disposition: A | Discharge status: Home / Self Care | Payer: Medicare Other | Attending: Emergency Medicine | Admitting: Emergency Medicine

## 2013-02-24 ENCOUNTER — Encounter (HOSPITAL_COMMUNITY): Payer: Self-pay | Admitting: *Deleted

## 2013-02-24 DIAGNOSIS — Z872 Personal history of diseases of the skin and subcutaneous tissue: Secondary | ICD-10-CM | POA: Insufficient documentation

## 2013-02-24 DIAGNOSIS — T6391XA Toxic effect of contact with unspecified venomous animal, accidental (unintentional), initial encounter: Secondary | ICD-10-CM | POA: Insufficient documentation

## 2013-02-24 DIAGNOSIS — Y929 Unspecified place or not applicable: Secondary | ICD-10-CM | POA: Insufficient documentation

## 2013-02-24 DIAGNOSIS — Z8719 Personal history of other diseases of the digestive system: Secondary | ICD-10-CM | POA: Insufficient documentation

## 2013-02-24 DIAGNOSIS — T63461A Toxic effect of venom of wasps, accidental (unintentional), initial encounter: Secondary | ICD-10-CM | POA: Insufficient documentation

## 2013-02-24 DIAGNOSIS — Z860100 Personal history of colon polyps, unspecified: Secondary | ICD-10-CM | POA: Insufficient documentation

## 2013-02-24 DIAGNOSIS — E559 Vitamin D deficiency, unspecified: Secondary | ICD-10-CM | POA: Insufficient documentation

## 2013-02-24 DIAGNOSIS — Y939 Activity, unspecified: Secondary | ICD-10-CM | POA: Insufficient documentation

## 2013-02-24 DIAGNOSIS — E785 Hyperlipidemia, unspecified: Secondary | ICD-10-CM | POA: Insufficient documentation

## 2013-02-24 DIAGNOSIS — Z880 Allergy status to penicillin: Secondary | ICD-10-CM | POA: Insufficient documentation

## 2013-02-24 DIAGNOSIS — Z87442 Personal history of urinary calculi: Secondary | ICD-10-CM | POA: Insufficient documentation

## 2013-02-24 DIAGNOSIS — I10 Essential (primary) hypertension: Secondary | ICD-10-CM | POA: Insufficient documentation

## 2013-02-24 DIAGNOSIS — Z87448 Personal history of other diseases of urinary system: Secondary | ICD-10-CM | POA: Insufficient documentation

## 2013-02-24 DIAGNOSIS — R609 Edema, unspecified: Secondary | ICD-10-CM | POA: Insufficient documentation

## 2013-02-24 DIAGNOSIS — K219 Gastro-esophageal reflux disease without esophagitis: Secondary | ICD-10-CM | POA: Insufficient documentation

## 2013-02-24 DIAGNOSIS — Z8679 Personal history of other diseases of the circulatory system: Secondary | ICD-10-CM | POA: Insufficient documentation

## 2013-02-24 DIAGNOSIS — E538 Deficiency of other specified B group vitamins: Secondary | ICD-10-CM | POA: Insufficient documentation

## 2013-02-24 DIAGNOSIS — Z8619 Personal history of other infectious and parasitic diseases: Secondary | ICD-10-CM | POA: Insufficient documentation

## 2013-02-24 MED ORDER — HYDROCODONE-ACETAMINOPHEN 5-325 MG PO TABS: 1.0000 | ORAL_TABLET | ORAL | Status: DC | PRN

## 2013-02-24 NOTE — ED Notes (Signed)
Rt foot red  Swollen and blisters .  She was stung by fire ants 2 weeks ago she has [facility] same

## 2013-02-24 NOTE — ED Provider Notes (Signed)
History    CSN: [id number] Arrival date & time 02/24/13  1806  First MD Initiated Contact with Patient 02/24/13 1811     No chief complaint on file.  (Consider location/radiation/quality/duration/timing/severity/associated sxs/prior Treatment) The history is provided by the patient.  79 year old female was stung by and said on her right foot. This occurred about 9 days ago. Her foot started swelling and she went to an urgent care Center about 6 days ago and was given prescriptions for hydroxyzine and doxycycline. She has developed blisters on the foot and she's concerned because the blisters are not going down. Overall, her foot it has been improving except for the persistence of the blister. She denies fever, chills, sweats. She has noted some swelling of her foot. Past Medical History  Diagnosis Date  . Diverticulosis of colon   . Hyperlipidemia   . Hypertension   . GERD with stricture     Dr Marina Goodell  . Vitamin B12 deficiency 2009    Borderline  . Hemorrhoids   . Hiatal hernia   . Polycystic kidney   . Esophageal stricture   . Tubulovillous adenoma polyp of colon   . Onychomycosis   . Personal history of urinary calculi   . Contact dermatitis and other eczema, due to unspecified cause   . Unspecified vitamin D deficiency    Past Surgical History  Procedure Laterality Date  . Tonsillectomy    . Dilation and curettage of uterus  1977  . Toe surgery      Right foot    Family History  Problem Relation Age of Onset  . Colon cancer Neg Hx   . Hypertension Father    History  Substance Use Topics  . Smoking status: Never Smoker   . Smokeless tobacco: Never Used  . Alcohol Use: No   OB History   Grav Para Term Preterm Abortions TAB SAB Ect Mult Living                 Review of Systems  All other systems reviewed and are negative.    Allergies  Clarithromycin; Penicillins; and Sulfonamide derivatives  Home Medications   Current Outpatient Rx  Name  Route  Sig   Dispense  Refill  . Cholecalciferol 1000 UNITS tablet   Oral   Take 1,000 Units by mouth daily.           . ciprofloxacin (CIPRO) 250 MG tablet   Oral   Take 1 tablet (250 mg total) by mouth 2 (two) times daily.   20 tablet   0   . EXPIRED: hydrocortisone (ANUSOL-HC) 25 MG suppository   Rectal   Place 25 mg rectally 2 (two) times daily.         Marland Kitchen lovastatin (MEVACOR) 40 MG tablet      TAKE 1 TABLET (40 MG TOTAL) BY MOUTH AT BEDTIME.   90 tablet   3   . metoprolol (LOPRESSOR) 50 MG tablet      TAKE 1 TABLET BY MOUTH TWICE A DAY   180 tablet   2   . omeprazole (PRILOSEC) 20 MG capsule      TAKE 1 CAPSULE (20 MG TOTAL) BY MOUTH DAILY.   90 capsule   3   . phenazopyridine (PYRIDIUM) 100 MG tablet   Oral   Take 1 tablet (100 mg total) by mouth 3 (three) times daily as needed for pain.   20 tablet   0   . polyethylene glycol (GLYCOLAX) powder  Oral   Take 17 g by mouth as needed.           . ranitidine (ZANTAC) 150 MG capsule      TAKE 1 CAPSULE (150 MG TOTAL) BY MOUTH 2 (TWO) TIMES DAILY.   180 capsule   3   . silver sulfADIAZINE (SILVADENE) 1 % cream   Topical   Apply topically 2 (two) times daily as needed.           . triamterene-hydrochlorothiazide (MAXZIDE-25) 37.5-25 MG per tablet      TAKE 1 EACH (1 TABLET TOTAL) BY MOUTH DAILY.   90 tablet   3   . vitamin B-12 (CYANOCOBALAMIN) 1000 MCG tablet   Oral   Take 500 mcg by mouth daily.            BP 144/79  Pulse 70  Temp(Src) 97.9 F (36.6 C) (Oral)  Resp 16  SpO2 98% Physical Exam  Nursing note and vitals reviewed.  79 year old female, resting comfortably and in no acute distress. Vital signs are significant for hypertension with blood pressure 144/79. Oxygen saturation is 98%, which is normal. Head is normocephalic and atraumatic. PERRLA, EOMI. Oropharynx is clear. Neck is nontender and supple without adenopathy or JVD. Back is nontender and there is no CVA tenderness. Lungs are  clear without rales, wheezes, or rhonchi. Chest is nontender. Heart has regular rate and rhythm without murmur. Abdomen is soft, flat, nontender without masses or hepatosplenomegaly and peristalsis is normoactive. Extremities: the right foot is moderately erythematous with a large bulla over the distal third and fourth metatarsals. This bulla is fairly tense. There is 2+ edema of the right foot and no one plus edema of the left foot and 1+ pretibial edema bilaterally. There are no lymphangitic streaks.. Skin is warm and dry without other rash. Neurologic: Mental status is normal, cranial nerves are intact, there are no motor or sensory deficits.  ED Course  Procedures (including critical care time) Procedure: Debridement of bulla of right foot Patient gave verbal consent to the procedure. 2 patient identifiers were used-patient verbally acknowledged her name and also hospital bracelet. Therefore the procedure was done, a timeout was performed. The bulla was opened with sharp dissection with scissors and debrided and 2 the limits of viable takes tissue. Resulting scar is approximately 2.5 cm in diameter. Sterile dressing is applied. Patient tolerated procedure well.  1. Insect sting, subsequent encounter   2. Peripheral edema     MDM  Slowly healing insect stinging of her foot with persistent bulla. The patient was advised that bulla are usually best treated with watchful waiting until a rupture spontaneously. However, she states she's wearing comfortable with the bulla and wishes that it be debrided. This was done and dressing applied. She's advised to dress the wound twice a day. She is discharged with prescription for hydrocodone-acetaminophen to take as needed for pain.  Dione Booze, MD 02/24/13 [phone]

## 2013-02-24 NOTE — ED Notes (Signed)
Pt signed, but signature pad not registering signature.

## 2013-03-02 ENCOUNTER — Ambulatory Visit (INDEPENDENT_AMBULATORY_CARE_PROVIDER_SITE_OTHER): Payer: Medicare Other | Admitting: Internal Medicine

## 2013-03-02 ENCOUNTER — Encounter: Payer: Self-pay | Admitting: Internal Medicine

## 2013-03-02 VITALS — BP 122/72 | HR 80 | Temp 97.0°F | Resp 16 | Wt 222.0 lb

## 2013-03-02 DIAGNOSIS — Z23 Encounter for immunization: Secondary | ICD-10-CM

## 2013-03-02 DIAGNOSIS — S91309A Unspecified open wound, unspecified foot, initial encounter: Secondary | ICD-10-CM | POA: Insufficient documentation

## 2013-03-02 DIAGNOSIS — IMO0002 Reserved for concepts with insufficient information to code with codable children: Secondary | ICD-10-CM

## 2013-03-02 DIAGNOSIS — S91301S Unspecified open wound, right foot, sequela: Secondary | ICD-10-CM

## 2013-03-02 MED ORDER — MUPIROCIN 2 % EX OINT: TOPICAL_OINTMENT | CUTANEOUS | Status: DC

## 2013-03-02 NOTE — Progress Notes (Signed)
   Subjective:    HPI  C/o wound on R foot x 2 wks - s/p fire ant sting Went to ER 1 wk ago The patient needs to address  chronic hypertension that has been well controlled with medicines; to address chronic  hyperlipidemia controlled with medicines as well; and to address GERD, B12 def No memory issues  Wt Readings from Last 3 Encounters:  03/02/13 222 lb (100.699 kg)  11/16/12 222 lb (100.699 kg)  11/01/12 221 lb (100.245 kg)   BP Readings from Last 3 Encounters:  03/02/13 122/72  02/24/13 129/61  11/16/12 130/70       Review of Systems  Constitutional: Negative for activity change, appetite change, fatigue and unexpected weight change.  HENT: Negative for congestion, mouth sores and sinus pressure.   Eyes: Negative for visual disturbance.  Respiratory: Negative for cough and chest tightness.   Gastrointestinal: Positive for anal bleeding.  Genitourinary: Negative for difficulty urinating and vaginal pain.  Musculoskeletal: Negative for gait problem.  Skin: Negative for pallor and rash.  Neurological: Negative for dizziness and tremors.  Psychiatric/Behavioral: Negative for confusion and sleep disturbance.       Objective:   Physical Exam  Constitutional: She appears well-developed. No distress.  obese  HENT:  Head: Normocephalic.  Right Ear: External ear normal.  Left Ear: External ear normal.  Nose: Nose normal.  Mouth/Throat: Oropharynx is clear and moist.  Eyes: Conjunctivae are normal. Pupils are equal, round, and reactive to light. Right eye exhibits no discharge. Left eye exhibits no discharge.  Neck: Normal range of motion. Neck supple. No JVD present. No tracheal deviation present. No thyromegaly present.  Cardiovascular: Normal rate, regular rhythm and normal heart sounds.   Pulmonary/Chest: No stridor. No respiratory distress. She has no wheezes.  Abdominal: Soft. Bowel sounds are normal. She exhibits no distension and no mass. There is no tenderness.  There is no rebound and no guarding.  Musculoskeletal: She exhibits no edema and no tenderness.  Lymphadenopathy:    She has no cervical adenopathy.  Neurological: She displays normal reflexes. No cranial nerve deficit. She exhibits normal muscle tone. Coordination normal.  Skin: No rash noted. No erythema.  Psychiatric: She has a normal mood and affect. Her behavior is normal. Judgment and thought content normal.   There is a large erosion on R dors foot; no cellulitis  Lab Results  Component Value Date   WBC 2.8* 05/31/2012   HGB 13.9 05/31/2012   HCT 41.5 05/31/2012   PLT 136.0* 05/31/2012   GLUCOSE 93 11/01/2012   CHOL 225* 05/31/2012   TRIG 169.0* 05/31/2012   HDL 50.00 05/31/2012   LDLDIRECT 139.3 05/31/2012   LDLCALC 106* 11/24/2011   ALT 19 05/31/2012   AST 20 05/31/2012   NA 140 11/01/2012   K 3.9 11/01/2012   CL 105 11/01/2012   CREATININE 1.3* 11/01/2012   BUN 23 11/01/2012   CO2 28 11/01/2012   TSH 1.79 05/31/2012   INR 1.16 08/16/2009         Assessment & Plan:

## 2013-03-02 NOTE — Assessment & Plan Note (Signed)
7/14 R foot 4x3 cm DT Mupirocin UC gave Doxy on 7/5

## 2013-03-04 ENCOUNTER — Encounter: Payer: Self-pay | Admitting: Internal Medicine

## 2013-03-15 ENCOUNTER — Ambulatory Visit
Admission: RE | Admit: 2013-03-15 | Discharge: 2013-03-15 | Disposition: A | Discharge status: Home / Self Care | Payer: Medicare Other | Source: Ambulatory Visit

## 2013-03-15 DIAGNOSIS — Z1231 Encounter for screening mammogram for malignant neoplasm of breast: Secondary | ICD-10-CM

## 2013-03-27 ENCOUNTER — Encounter: Payer: Self-pay | Admitting: Internal Medicine

## 2013-03-27 ENCOUNTER — Ambulatory Visit (INDEPENDENT_AMBULATORY_CARE_PROVIDER_SITE_OTHER): Payer: Medicare Other | Admitting: Internal Medicine

## 2013-03-27 VITALS — BP 130/80 | HR 80 | Temp 97.0°F | Resp 16 | Wt 219.0 lb

## 2013-03-27 DIAGNOSIS — L259 Unspecified contact dermatitis, unspecified cause: Secondary | ICD-10-CM

## 2013-03-27 MED ORDER — TRIAMCINOLONE ACETONIDE 0.5 % EX OINT: TOPICAL_OINTMENT | Freq: Two times a day (BID) | CUTANEOUS | Status: DC

## 2013-03-27 MED ORDER — HYDROXYZINE HCL 25 MG PO TABS: 25.0000 mg | ORAL_TABLET | Freq: Three times a day (TID) | ORAL | Status: DC | PRN

## 2013-03-27 MED ORDER — HYDROCODONE-ACETAMINOPHEN 5-325 MG PO TABS: 1.0000 | ORAL_TABLET | ORAL | Status: DC | PRN

## 2013-03-27 NOTE — Progress Notes (Signed)
   Subjective:    HPI  C/o rash on left foot x 2 d ago - a bug bite or a sting C/o pain, itching and L 1st MTP pain and swelling The patient needs to address  chronic hypertension that has been well controlled with medicines; to address chronic  hyperlipidemia controlled with medicines as well; and to address GERD, B12 def No memory issues  Wt Readings from Last 3 Encounters:  03/27/13 219 lb (99.338 kg)  03/02/13 222 lb (100.699 kg)  11/16/12 222 lb (100.699 kg)   BP Readings from Last 3 Encounters:  03/27/13 130/80  03/02/13 122/72  02/24/13 129/61       Review of Systems  Constitutional: Negative for activity change, appetite change, fatigue and unexpected weight change.  HENT: Negative for congestion, mouth sores and sinus pressure.   Eyes: Negative for visual disturbance.  Respiratory: Negative for cough and chest tightness.   Gastrointestinal: Positive for anal bleeding.  Genitourinary: Negative for difficulty urinating and vaginal pain.  Musculoskeletal: Negative for gait problem.  Skin: Negative for pallor and rash.  Neurological: Negative for dizziness and tremors.  Psychiatric/Behavioral: Negative for confusion and sleep disturbance.       Objective:   Physical Exam  Constitutional: She appears well-developed. No distress.  obese  HENT:  Head: Normocephalic.  Right Ear: External ear normal.  Left Ear: External ear normal.  Nose: Nose normal.  Mouth/Throat: Oropharynx is clear and moist.  Eyes: Conjunctivae are normal. Pupils are equal, round, and reactive to light. Right eye exhibits no discharge. Left eye exhibits no discharge.  Neck: Normal range of motion. Neck supple. No JVD present. No tracheal deviation present. No thyromegaly present.  Cardiovascular: Normal rate, regular rhythm and normal heart sounds.   Pulmonary/Chest: No stridor. No respiratory distress. She has no wheezes.  Abdominal: Soft. Bowel sounds are normal. She exhibits no distension  and no mass. There is no tenderness. There is no rebound and no guarding.  Musculoskeletal: She exhibits no edema and no tenderness.  Lymphadenopathy:    She has no cervical adenopathy.  Neurological: She displays normal reflexes. No cranial nerve deficit. She exhibits normal muscle tone. Coordination normal.  Skin: No rash noted. No erythema.  Psychiatric: She has a normal mood and affect. Her behavior is normal. Judgment and thought content normal.   There is a 4 mm vesicle on L dors foot next to 1st MTP; no cellulitis, tender skin 2 cm, cool  Lab Results  Component Value Date   WBC 2.8* 05/31/2012   HGB 13.9 05/31/2012   HCT 41.5 05/31/2012   PLT 136.0* 05/31/2012   GLUCOSE 93 11/01/2012   CHOL 225* 05/31/2012   TRIG 169.0* 05/31/2012   HDL 50.00 05/31/2012   LDLDIRECT 139.3 05/31/2012   LDLCALC 106* 11/24/2011   ALT 19 05/31/2012   AST 20 05/31/2012   NA 140 11/01/2012   K 3.9 11/01/2012   CL 105 11/01/2012   CREATININE 1.3* 11/01/2012   BUN 23 11/01/2012   CO2 28 11/01/2012   TSH 1.79 05/31/2012   INR 1.16 08/16/2009         Assessment & Plan:

## 2013-03-27 NOTE — Assessment & Plan Note (Addendum)
Insect bite rash L foot, local reaction Triamc oint Hdroxyzine prn Norco prn

## 2013-05-22 ENCOUNTER — Encounter: Payer: Self-pay | Admitting: Internal Medicine

## 2013-05-22 ENCOUNTER — Ambulatory Visit (INDEPENDENT_AMBULATORY_CARE_PROVIDER_SITE_OTHER): Payer: Medicare Other | Admitting: Internal Medicine

## 2013-05-22 ENCOUNTER — Other Ambulatory Visit (INDEPENDENT_AMBULATORY_CARE_PROVIDER_SITE_OTHER): Payer: Medicare Other

## 2013-05-22 VITALS — BP 122/85 | HR 62 | Temp 96.9°F | Wt 218.9 lb

## 2013-05-22 DIAGNOSIS — E785 Hyperlipidemia, unspecified: Secondary | ICD-10-CM

## 2013-05-22 DIAGNOSIS — Z23 Encounter for immunization: Secondary | ICD-10-CM

## 2013-05-22 DIAGNOSIS — I10 Essential (primary) hypertension: Secondary | ICD-10-CM

## 2013-05-22 DIAGNOSIS — D72819 Decreased white blood cell count, unspecified: Secondary | ICD-10-CM | POA: Insufficient documentation

## 2013-05-22 DIAGNOSIS — Z0000 Encounter for general adult medical examination without abnormal findings: Secondary | ICD-10-CM

## 2013-05-22 LAB — TSH: TSH: 1 u[IU]/mL (ref 0.35–5.50)

## 2013-05-22 LAB — URINALYSIS, ROUTINE W REFLEX MICROSCOPIC
Ketones, ur: NEGATIVE
Specific Gravity, Urine: 1.01 (ref 1.000–1.030)
Urine Glucose: NEGATIVE
Urobilinogen, UA: 0.2 (ref 0.0–1.0)
pH: 7 (ref 5.0–8.0)

## 2013-05-22 LAB — CBC WITH DIFFERENTIAL/PLATELET
Basophils Absolute: 0 10*3/uL (ref 0.0–0.1)
Basophils Relative: 0.4 % (ref 0.0–3.0)
Eosinophils Absolute: 0 10*3/uL (ref 0.0–0.7)
Hemoglobin: 14.1 g/dL (ref 12.0–15.0)
Lymphocytes Relative: 52.3 % — ABNORMAL HIGH (ref 12.0–46.0)
Monocytes Relative: 10 % (ref 3.0–12.0)
Neutro Abs: 1.2 10*3/uL — ABNORMAL LOW (ref 1.4–7.7)
Neutrophils Relative %: 36.3 % — ABNORMAL LOW (ref 43.0–77.0)
RBC: 4.46 Mil/uL (ref 3.87–5.11)

## 2013-05-22 LAB — HEPATIC FUNCTION PANEL
Alkaline Phosphatase: 48 U/L (ref 39–117)
Bilirubin, Direct: 0.1 mg/dL (ref 0.0–0.3)
Total Bilirubin: 0.7 mg/dL (ref 0.3–1.2)
Total Protein: 7.1 g/dL (ref 6.0–8.3)

## 2013-05-22 LAB — BASIC METABOLIC PANEL
Calcium: 9.3 mg/dL (ref 8.4–10.5)
Creatinine, Ser: 1 mg/dL (ref 0.4–1.2)
GFR: 70.69 mL/min (ref >60.00)
Sodium: 138 mEq/L (ref 135–145)

## 2013-05-22 LAB — LIPID PANEL
HDL: 57 mg/dL (ref >39.00)
LDL Cholesterol: 93 mg/dL (ref 0–99)
Total CHOL/HDL Ratio: 3
Triglycerides: 116 mg/dL (ref 0.0–149.0)
VLDL: 23.2 mg/dL (ref 0.0–40.0)

## 2013-05-22 MED ORDER — CAPSAICIN 0.025 % EX CREA: TOPICAL_CREAM | Freq: Two times a day (BID) | CUTANEOUS | Status: DC

## 2013-05-22 NOTE — Assessment & Plan Note (Signed)
Continue with current prescription therapy as reflected on the Med list.  

## 2013-05-22 NOTE — Assessment & Plan Note (Signed)
CBC

## 2013-05-22 NOTE — Assessment & Plan Note (Signed)

## 2013-05-23 ENCOUNTER — Encounter: Payer: Self-pay | Admitting: Internal Medicine

## 2013-05-23 NOTE — Progress Notes (Signed)
   Subjective:    HPI The patient is here for a wellness exam. The patient has been doing well overall without major physical or psychological issues going on lately. The patient needs to address  chronic hypertension that has been well controlled with medicines; to address chronic  hyperlipidemia controlled with medicines as well; and to address GERD, B12 def No memory issues lately  Wt Readings from Last 3 Encounters:  05/22/13 218 lb 14.4 oz (99.292 kg)  03/27/13 219 lb (99.338 kg)  03/02/13 222 lb (100.699 kg)   BP Readings from Last 3 Encounters:  05/22/13 122/85  03/27/13 130/80  03/02/13 122/72       Review of Systems  Constitutional: Negative for chills, activity change, appetite change, fatigue and unexpected weight change.  HENT: Negative for congestion, mouth sores and sinus pressure.   Eyes: Negative for visual disturbance.  Respiratory: Negative for cough and chest tightness.   Gastrointestinal: Positive for anal bleeding. Negative for nausea and abdominal pain.  Genitourinary: Negative for frequency, difficulty urinating and vaginal pain.  Musculoskeletal: Negative for back pain and gait problem.  Skin: Negative for pallor and rash.  Neurological: Negative for dizziness, tremors, weakness, numbness and headaches.  Psychiatric/Behavioral: Negative for confusion and sleep disturbance.       Objective:   Physical Exam  Constitutional: She appears well-developed. No distress.  obese  HENT:  Head: Normocephalic.  Right Ear: External ear normal.  Left Ear: External ear normal.  Nose: Nose normal.  Mouth/Throat: Oropharynx is clear and moist.  Eyes: Conjunctivae are normal. Pupils are equal, round, and reactive to light. Right eye exhibits no discharge. Left eye exhibits no discharge.  Neck: Normal range of motion. Neck supple. No JVD present. No tracheal deviation present. No thyromegaly present.  Cardiovascular: Normal rate, regular rhythm and normal heart  sounds.   Pulmonary/Chest: No stridor. No respiratory distress. She has no wheezes.  Abdominal: Soft. Bowel sounds are normal. She exhibits no distension and no mass. There is no tenderness. There is no rebound and no guarding.  Musculoskeletal: She exhibits no edema and no tenderness.  Lymphadenopathy:    She has no cervical adenopathy.  Neurological: She displays normal reflexes. No cranial nerve deficit. She exhibits normal muscle tone. Coordination normal.  Skin: No rash noted. No erythema.  Psychiatric: She has a normal mood and affect. Her behavior is normal. Judgment and thought content normal.          Assessment & Plan:

## 2013-06-11 ENCOUNTER — Other Ambulatory Visit: Payer: Self-pay | Admitting: Internal Medicine

## 2013-06-11 NOTE — Telephone Encounter (Signed)
Refill done.  

## 2013-09-05 ENCOUNTER — Other Ambulatory Visit: Payer: Self-pay | Admitting: Internal Medicine

## 2013-09-07 ENCOUNTER — Other Ambulatory Visit: Payer: Self-pay | Admitting: Internal Medicine

## 2013-09-21 ENCOUNTER — Ambulatory Visit (INDEPENDENT_AMBULATORY_CARE_PROVIDER_SITE_OTHER): Payer: Medicare Other | Admitting: Internal Medicine

## 2013-09-21 ENCOUNTER — Other Ambulatory Visit (INDEPENDENT_AMBULATORY_CARE_PROVIDER_SITE_OTHER): Payer: Medicare Other

## 2013-09-21 ENCOUNTER — Encounter: Payer: Self-pay | Admitting: Internal Medicine

## 2013-09-21 VITALS — BP 120/80 | HR 80 | Temp 98.0°F | Resp 16 | Wt 221.0 lb

## 2013-09-21 DIAGNOSIS — E538 Deficiency of other specified B group vitamins: Secondary | ICD-10-CM

## 2013-09-21 DIAGNOSIS — M545 Low back pain, unspecified: Secondary | ICD-10-CM

## 2013-09-21 DIAGNOSIS — I10 Essential (primary) hypertension: Secondary | ICD-10-CM

## 2013-09-21 DIAGNOSIS — E669 Obesity, unspecified: Secondary | ICD-10-CM

## 2013-09-21 DIAGNOSIS — Z23 Encounter for immunization: Secondary | ICD-10-CM

## 2013-09-21 LAB — CBC WITH DIFFERENTIAL/PLATELET
BASOS PCT: 0.3 % (ref 0.0–3.0)
Basophils Absolute: 0 10*3/uL (ref 0.0–0.1)
EOS PCT: 0.7 % (ref 0.0–5.0)
Eosinophils Absolute: 0 10*3/uL (ref 0.0–0.7)
HEMATOCRIT: 40.1 % (ref 36.0–46.0)
Hemoglobin: 13.4 g/dL (ref 12.0–15.0)
LYMPHS ABS: 1.5 10*3/uL (ref 0.7–4.0)
Lymphocytes Relative: 50.6 % — ABNORMAL HIGH (ref 12.0–46.0)
MCHC: 33.5 g/dL (ref 30.0–36.0)
MCV: 95.3 fl (ref 78.0–100.0)
MONO ABS: 0.3 10*3/uL (ref 0.1–1.0)
Monocytes Relative: 9.7 % (ref 3.0–12.0)
NEUTROS ABS: 1.1 10*3/uL — AB (ref 1.4–7.7)
Neutrophils Relative %: 38.7 % — ABNORMAL LOW (ref 43.0–77.0)
Platelets: 134 10*3/uL — ABNORMAL LOW (ref 150.0–400.0)
RBC: 4.21 Mil/uL (ref 3.87–5.11)
RDW: 14.8 % — ABNORMAL HIGH (ref 11.5–14.6)
WBC: 3 10*3/uL — AB (ref 4.5–10.5)

## 2013-09-21 LAB — BASIC METABOLIC PANEL
BUN: 21 mg/dL (ref 6–23)
CHLORIDE: 103 meq/L (ref 96–112)
CO2: 28 meq/L (ref 19–32)
CREATININE: 1.1 mg/dL (ref 0.4–1.2)
Calcium: 9.1 mg/dL (ref 8.4–10.5)
GFR: 65.22 mL/min (ref >60.00)
Glucose, Bld: 88 mg/dL (ref 70–99)
Potassium: 3.7 mEq/L (ref 3.5–5.1)
SODIUM: 138 meq/L (ref 135–145)

## 2013-09-21 LAB — VITAMIN B12: Vitamin B-12: 392 pg/mL (ref 211–911)

## 2013-09-21 NOTE — Assessment & Plan Note (Signed)
Wt Readings from Last 3 Encounters:  09/21/13 221 lb (100.245 kg)  05/22/13 218 lb 14.4 oz (99.292 kg)  03/27/13 219 lb (99.338 kg)

## 2013-09-21 NOTE — Assessment & Plan Note (Signed)
Continue with current prescription therapy as reflected on the Med list.  

## 2013-09-21 NOTE — Progress Notes (Signed)
   Subjective:    HPI  The patient needs to address  chronic hypertension that has been well controlled with medicines; to address chronic  hyperlipidemia controlled with medicines as well; and to address GERD, B12 def. Zantac was $95 - she stopped. She is on Prilosec No memory issues lately  Wt Readings from Last 3 Encounters:  09/21/13 221 lb (100.245 kg)  05/22/13 218 lb 14.4 oz (99.292 kg)  03/27/13 219 lb (99.338 kg)   BP Readings from Last 3 Encounters:  09/21/13 120/80  05/22/13 122/85  03/27/13 130/80       Review of Systems  Constitutional: Negative for chills, activity change, appetite change, fatigue and unexpected weight change.  HENT: Negative for congestion, mouth sores and sinus pressure.   Eyes: Negative for visual disturbance.  Respiratory: Negative for cough and chest tightness.   Gastrointestinal: Positive for anal bleeding. Negative for nausea and abdominal pain.  Genitourinary: Negative for frequency, difficulty urinating and vaginal pain.  Musculoskeletal: Negative for back pain and gait problem.  Skin: Negative for pallor and rash.  Neurological: Negative for dizziness, tremors, weakness, numbness and headaches.  Psychiatric/Behavioral: Negative for confusion and sleep disturbance.       Objective:   Physical Exam  Constitutional: She appears well-developed. No distress.  obese  HENT:  Head: Normocephalic.  Right Ear: External ear normal.  Left Ear: External ear normal.  Nose: Nose normal.  Mouth/Throat: Oropharynx is clear and moist.  Eyes: Conjunctivae are normal. Pupils are equal, round, and reactive to light. Right eye exhibits no discharge. Left eye exhibits no discharge.  Neck: Normal range of motion. Neck supple. No JVD present. No tracheal deviation present. No thyromegaly present.  Cardiovascular: Normal rate, regular rhythm and normal heart sounds.   Pulmonary/Chest: No stridor. No respiratory distress. She has no wheezes.   Abdominal: Soft. Bowel sounds are normal. She exhibits no distension and no mass. There is no tenderness. There is no rebound and no guarding.  Musculoskeletal: She exhibits no edema and no tenderness.  Lymphadenopathy:    She has no cervical adenopathy.  Neurological: She displays normal reflexes. No cranial nerve deficit. She exhibits normal muscle tone. Coordination normal.  Skin: No rash noted. No erythema.  Psychiatric: She has a normal mood and affect. Her behavior is normal. Judgment and thought content normal.          Assessment & Plan:

## 2013-09-21 NOTE — Progress Notes (Signed)
Pre visit review using our clinic review tool, if applicable. No additional management support is needed unless otherwise documented below in the visit note. 

## 2013-09-23 ENCOUNTER — Encounter: Payer: Self-pay | Admitting: Internal Medicine

## 2013-09-23 ENCOUNTER — Encounter (HOSPITAL_COMMUNITY): Payer: Self-pay | Admitting: Emergency Medicine

## 2013-09-23 ENCOUNTER — Emergency Department (HOSPITAL_COMMUNITY)
Admission: EM | Admit: 2013-09-23 | Discharge: 2013-09-23 | Disposition: A | Discharge status: Home / Self Care | Payer: Medicare Other | Attending: Emergency Medicine | Admitting: Emergency Medicine

## 2013-09-23 DIAGNOSIS — Z880 Allergy status to penicillin: Secondary | ICD-10-CM | POA: Insufficient documentation

## 2013-09-23 DIAGNOSIS — K219 Gastro-esophageal reflux disease without esophagitis: Secondary | ICD-10-CM | POA: Insufficient documentation

## 2013-09-23 DIAGNOSIS — E785 Hyperlipidemia, unspecified: Secondary | ICD-10-CM | POA: Insufficient documentation

## 2013-09-23 DIAGNOSIS — E538 Deficiency of other specified B group vitamins: Secondary | ICD-10-CM | POA: Insufficient documentation

## 2013-09-23 DIAGNOSIS — Z79899 Other long term (current) drug therapy: Secondary | ICD-10-CM | POA: Insufficient documentation

## 2013-09-23 DIAGNOSIS — Z860100 Personal history of colon polyps, unspecified: Secondary | ICD-10-CM | POA: Insufficient documentation

## 2013-09-23 DIAGNOSIS — Z8619 Personal history of other infectious and parasitic diseases: Secondary | ICD-10-CM | POA: Insufficient documentation

## 2013-09-23 DIAGNOSIS — Z872 Personal history of diseases of the skin and subcutaneous tissue: Secondary | ICD-10-CM | POA: Insufficient documentation

## 2013-09-23 DIAGNOSIS — Z87442 Personal history of urinary calculi: Secondary | ICD-10-CM | POA: Insufficient documentation

## 2013-09-23 DIAGNOSIS — M5416 Radiculopathy, lumbar region: Secondary | ICD-10-CM

## 2013-09-23 DIAGNOSIS — I10 Essential (primary) hypertension: Secondary | ICD-10-CM | POA: Insufficient documentation

## 2013-09-23 DIAGNOSIS — IMO0002 Reserved for concepts with insufficient information to code with codable children: Secondary | ICD-10-CM | POA: Insufficient documentation

## 2013-09-23 MED ORDER — HYDROCODONE-ACETAMINOPHEN 5-325 MG PO TABS
1.0000 | ORAL_TABLET | Freq: Once | ORAL | Status: AC
  Administered 2013-09-23: 1 via ORAL
  Filled 2013-09-23: qty 1

## 2013-09-23 MED ORDER — HYDROCODONE-ACETAMINOPHEN 5-325 MG PO TABS: 1.0000 | ORAL_TABLET | Freq: Four times a day (QID) | ORAL | Status: DC | PRN

## 2013-09-23 MED ORDER — PREDNISONE 20 MG PO TABS: ORAL_TABLET | ORAL | Status: DC

## 2013-09-23 MED ORDER — METHYLPREDNISOLONE 16 MG PO TABS
16.0000 mg | ORAL_TABLET | Freq: Every day | ORAL | Status: DC
  Administered 2013-09-23: 16 mg via ORAL
  Filled 2013-09-23 (×2): qty 1

## 2013-09-23 NOTE — ED Provider Notes (Signed)
Medical screening examination/treatment/procedure(s) were performed by non-physician practitioner and as supervising physician I was immediately available for consultation/collaboration.  EKG Interpretation   None         Mariea Clonts, MD 09/23/13 [id number]

## 2013-09-23 NOTE — ED Provider Notes (Signed)
CSN: [id number]     Arrival date & time 09/23/13  1108 History  This chart was scribed for Junius Creamer, NP, working with Idelia Salm, MD, by Sydell Axon, ED Scribe. This patient was seen in room TR10C/TR10C and the patient's care was started at 11:37 AM.  Chief Complaint  Patient presents with  . Leg Pain   Patient is a 80 y.o. female presenting with leg pain. The history is provided by the patient. No language interpreter was used.  Leg Pain Location:  Hip, buttock and leg Time since incident:  3 days Injury: no   Hip location:  L hip Buttock location:  L buttock Leg location:  L leg Pain details:    Quality:  Aching   Severity:  Moderate   Onset quality:  Unable to specify   Duration:  3 days   Timing:  Constant   Progression:  Worsening Chronicity:  New Dislocation: no   Foreign body present:  No foreign bodies Tetanus status:  Unknown Prior injury to area:  No Relieved by:  Ice and heat Exacerbated by: certain position. Associated symptoms: no decreased ROM, no fever, no muscle weakness, no numbness, no swelling and no tingling     HPI Comments: Jasmin [name] is a 80 y.o. female who presents to the Emergency Department complaining of constant, nonchanging L leg pain with onset two days ago. She states the pain is a dull, aching pain which started in her upper thigh and radiates toward her upper shin.  Patient denies any history of leg pain and denies any trauma to the area.She recently received a PMA shot to the L shoulder; however, believes this to be unrelated to her pain. Patient states she has been unable to sleep due to her pain. Patient denies feeling SOB. Patient is allergic to azythromycin, penicillin and sulfonamide derivatives.   Past Medical History  Diagnosis Date  . Diverticulosis of colon   . Hyperlipidemia   . Hypertension   . GERD with stricture     Dr Henrene Pastor  . Vitamin B12 deficiency 2009    Borderline  . Hemorrhoids   . Hiatal hernia   .  Polycystic kidney   . Esophageal stricture   . Tubulovillous adenoma polyp of colon   . Onychomycosis   . Personal history of urinary calculi   . Contact dermatitis and other eczema, due to unspecified cause   . Unspecified vitamin D deficiency    Past Surgical History  Procedure Laterality Date  . Tonsillectomy    . Dilation and curettage of uterus  1977  . Toe surgery      Right foot    Family History  Problem Relation Age of Onset  . Colon cancer Neg Hx   . Hypertension Father    History  Substance Use Topics  . Smoking status: Never Smoker   . Smokeless tobacco: Never Used  . Alcohol Use: No   OB History   Grav Para Term Preterm Abortions TAB SAB Ect Mult Living                 Review of Systems  Constitutional: Negative for fever.  Respiratory: Negative for shortness of breath.   Cardiovascular: Negative for chest pain and leg swelling.  Genitourinary: Negative for dysuria and flank pain.  All other systems reviewed and are negative.    Allergies  Clarithromycin; Penicillins; and Sulfonamide derivatives  Home Medications   Current Outpatient Rx  Name  Route  Sig  Dispense  Refill  . capsaicin (ZOSTRIX) 0.025 % cream   Topical   Apply topically 2 (two) times daily. On R foot   60 g   1   . Cholecalciferol 1000 UNITS tablet   Oral   Take 1,000 Units by mouth daily.           Marland Kitchen HYDROcodone-acetaminophen (NORCO/VICODIN) 5-325 MG per tablet   Oral   Take 1 tablet by mouth every 4 (four) hours as needed for pain.   30 tablet   0   . HYDROcodone-acetaminophen (NORCO/VICODIN) 5-325 MG per tablet   Oral   Take 1 tablet by mouth every 6 (six) hours as needed for moderate pain.   12 tablet   0   . hydrocortisone (ANUSOL-HC) 25 MG suppository   Rectal   Place 25 mg rectally 2 (two) times daily.         . hydrOXYzine (ATARAX/VISTARIL) 25 MG tablet   Oral   Take 1 tablet (25 mg total) by mouth 3 (three) times daily as needed for itching.   30  tablet   0   . lovastatin (MEVACOR) 40 MG tablet      TAKE 1 TABLET (40 MG TOTAL) BY MOUTH AT BEDTIME.   90 tablet   3   . metoprolol (LOPRESSOR) 50 MG tablet      TAKE 1 TABLET BY MOUTH TWICE A DAY   180 tablet   1   . mupirocin ointment (BACTROBAN) 2 %      Use bid on the wound   30 g   0   . omeprazole (PRILOSEC) 20 MG capsule      TAKE 1 CAPSULE (20 MG TOTAL) BY MOUTH DAILY.   90 capsule   3   . polyethylene glycol (GLYCOLAX) powder   Oral   Take 17 g by mouth daily as needed (constipation).          . predniSONE (DELTASONE) 20 MG tablet      3 Tabs PO Days 1-3, then 2 tabs PO Days 4-6, then 1 tab PO Day 7-9, then Half Tab PO Day 10-12   20 tablet   0   . triamcinolone ointment (KENALOG) 0.5 %   Topical   Apply topically 2 (two) times daily.   30 g   0   . triamterene-hydrochlorothiazide (MAXZIDE-25) 37.5-25 MG per tablet      TAKE 1 EACH (1 TABLET TOTAL) BY MOUTH DAILY.   90 tablet   3   . vitamin B-12 (CYANOCOBALAMIN) 1000 MCG tablet   Oral   Take 500 mcg by mouth daily.            BP 147/57  Pulse 75  Temp(Src) 98 F (36.7 C) (Oral)  Resp 18  Ht 5\' 6"  (1.676 m)  Wt 221 lb (100.245 kg)  BMI 35.69 kg/m2  SpO2 100% Physical Exam  Nursing note and vitals reviewed. Constitutional: She is oriented to person, place, and time. She appears well-developed and well-nourished.  HENT:  Head: Normocephalic.  Eyes: Pupils are equal, round, and reactive to light.  Neck: Normal range of motion.  Cardiovascular: Normal rate.   Pulmonary/Chest: Effort normal and breath sounds normal.  Musculoskeletal: She exhibits no edema and no tenderness.       Back:  Neurological: She is alert and oriented to person, place, and time.  Skin: Skin is warm.    ED Course  Procedures (including critical care time)  DIAGNOSTIC STUDIES:     COORDINATION  OF CARE: 11:38 AM-Discussed the possibility of this being sciatica and encouraged effective sleeping  positions, using heat compression to alleviate symptoms and arranging an appointment with Dr.Plotnikov. Treatment plan discussed with patient and patient agrees.  Labs Review Labs Reviewed - No data to display Imaging Review No results found.  EKG Interpretation   None       MDM  After receiving pain medication.  Patient is, and the tori to be discharged him with a, Prednisone Dosepak, and pain medication, with instructions to followup with her primary care physician.  Next week I personally performed the services described in this documentation, which was scribed in my presence. The recorded information has been reviewed and is accurate.  Garald Balding, NP 09/23/13 1301  Garald Balding, NP 09/23/13 1308

## 2013-09-23 NOTE — ED Notes (Signed)
To ED for eval of left leg pain. States pain started Friday after having pneumonia shot at [place] office. Pain started in left hip and now down to shin area. Left pedal pulses palpable and strong. Denies SOB. Denies trauma to leg.

## 2013-09-23 NOTE — ED Notes (Signed)
Pt ambulated without assistance to bathroom without difficulty; pt c/o slight pain in ankle region but far different than upon admission when pt was escorted in by wheelchair.

## 2013-09-23 NOTE — ED Notes (Addendum)
Called pharmacy after sending note to send medication (Medrol); was told medication would be sent right away

## 2013-09-28 ENCOUNTER — Ambulatory Visit (INDEPENDENT_AMBULATORY_CARE_PROVIDER_SITE_OTHER): Payer: Medicare Other | Admitting: Internal Medicine

## 2013-09-28 ENCOUNTER — Ambulatory Visit (INDEPENDENT_AMBULATORY_CARE_PROVIDER_SITE_OTHER)
Admission: RE | Admit: 2013-09-28 | Discharge: 2013-09-28 | Disposition: A | Discharge status: Home / Self Care | Payer: Medicare Other | Source: Ambulatory Visit | Attending: Internal Medicine | Admitting: Internal Medicine

## 2013-09-28 ENCOUNTER — Encounter: Payer: Self-pay | Admitting: Internal Medicine

## 2013-09-28 VITALS — BP 128/78 | HR 80 | Temp 96.4°F | Resp 16

## 2013-09-28 DIAGNOSIS — M545 Low back pain, unspecified: Secondary | ICD-10-CM

## 2013-09-28 NOTE — Progress Notes (Signed)
Pre visit review using our clinic review tool, if applicable. No additional management support is needed unless otherwise documented below in the visit note. 

## 2013-09-28 NOTE — Progress Notes (Signed)
   Subjective:    HPI  S/p ER visit on 09/23/13 for L schiatica pain. On Predn taber - better  The patient needs to address  chronic hypertension that has been well controlled with medicines; to address chronic  hyperlipidemia controlled with medicines as well; and to address GERD, B12 def. Zantac was $95 - she stopped. She is on Prilosec No memory issues lately  Wt Readings from Last 3 Encounters:  09/23/13 221 lb (100.245 kg)  09/21/13 221 lb (100.245 kg)  05/22/13 218 lb 14.4 oz (99.292 kg)   BP Readings from Last 3 Encounters:  09/28/13 128/78  09/23/13 147/57  09/21/13 120/80       Review of Systems  Constitutional: Negative for chills, activity change, appetite change, fatigue and unexpected weight change.  HENT: Negative for congestion, mouth sores and sinus pressure.   Eyes: Negative for visual disturbance.  Respiratory: Negative for cough and chest tightness.   Gastrointestinal: Positive for anal bleeding. Negative for nausea and abdominal pain.  Genitourinary: Negative for frequency, difficulty urinating and vaginal pain.  Musculoskeletal: Positive for back pain. Negative for gait problem.  Skin: Negative for pallor and rash.  Neurological: Negative for dizziness, tremors, weakness, numbness and headaches.  Psychiatric/Behavioral: Negative for confusion and sleep disturbance.       Objective:   Physical Exam  Constitutional: She appears well-developed. No distress.  obese  HENT:  Head: Normocephalic.  Right Ear: External ear normal.  Left Ear: External ear normal.  Nose: Nose normal.  Mouth/Throat: Oropharynx is clear and moist.  Eyes: Conjunctivae are normal. Pupils are equal, round, and reactive to light. Right eye exhibits no discharge. Left eye exhibits no discharge.  Neck: Normal range of motion. Neck supple. No JVD present. No tracheal deviation present. No thyromegaly present.  Cardiovascular: Normal rate, regular rhythm and normal heart sounds.    Pulmonary/Chest: No stridor. No respiratory distress. She has no wheezes.  Abdominal: Soft. Bowel sounds are normal. She exhibits no distension and no mass. There is no tenderness. There is no rebound and no guarding.  Musculoskeletal: She exhibits tenderness. She exhibits no edema.  LS is tender on L   Lymphadenopathy:    She has no cervical adenopathy.  Neurological: She displays normal reflexes. No cranial nerve deficit. She exhibits normal muscle tone. Coordination normal.  Skin: No rash noted. No erythema.  Psychiatric: She has a normal mood and affect. Her behavior is normal. Judgment and thought content normal.          Assessment & Plan:

## 2013-09-28 NOTE — Assessment & Plan Note (Signed)
2/15 L sciatica Finish Predn taper Norco prn LS spine X ray RTC 2 wks - consider PT

## 2013-10-01 ENCOUNTER — Telehealth: Payer: Self-pay | Admitting: Internal Medicine

## 2013-10-01 NOTE — Telephone Encounter (Signed)
Requesting x-ray results from Friday.  Still in a lot of pain.

## 2013-10-01 NOTE — Telephone Encounter (Signed)
Notes Recorded by Cassandria Anger, MD on 09/28/2013 at 5:08 PM Erline Levine, please, inform patient that her back x ray shows arthritis - mild Thx

## 2013-10-02 NOTE — Telephone Encounter (Signed)
OK to start Thx

## 2013-10-02 NOTE — Telephone Encounter (Signed)
Pt informed of below. [facility] Does [facility] need 2 wk f/u. Her pain is still present today, but is better. Please advise.

## 2013-10-02 NOTE — Telephone Encounter (Signed)
Patient informed. 

## 2013-10-15 ENCOUNTER — Telehealth: Payer: Self-pay | Admitting: Internal Medicine

## 2013-10-15 NOTE — Telephone Encounter (Signed)
Patient's husband is calling to request a new rx for HYDROcodone-acetaminophen (NORCO/VICODIN) 5-325 MG. Pt is still in a lot of pain, per husband. Please advise.

## 2013-10-16 ENCOUNTER — Telehealth: Payer: Self-pay | Admitting: Internal Medicine

## 2013-10-16 DIAGNOSIS — M25569 Pain in unspecified knee: Secondary | ICD-10-CM

## 2013-10-16 NOTE — Telephone Encounter (Signed)
Requesting Hydrocodone-acetaminophen 5-325mg  Last refill:09-23-13:#12,0 Last OV:09-28-13 Please advise.//AB/CMA

## 2013-10-16 NOTE — Telephone Encounter (Signed)
Pt request referral for a specialist for leg pain. Pt stated that medication does not really helping. Please advise.

## 2013-10-17 MED ORDER — HYDROCODONE-ACETAMINOPHEN 5-325 MG PO TABS: 1.0000 | ORAL_TABLET | ORAL | Status: DC | PRN

## 2013-10-17 NOTE — Telephone Encounter (Signed)
OK to fill this prescription with additional refills x0 Thank you!  

## 2013-10-17 NOTE — Telephone Encounter (Signed)
Rx printed and given to Dr. Plotnikov to sign.//AB/CMA 

## 2013-10-17 NOTE — Telephone Encounter (Signed)
OK. Thx

## 2013-10-19 ENCOUNTER — Ambulatory Visit (HOSPITAL_COMMUNITY): Payer: Medicare Other | Attending: Family Medicine

## 2013-10-19 ENCOUNTER — Ambulatory Visit (INDEPENDENT_AMBULATORY_CARE_PROVIDER_SITE_OTHER): Payer: Medicare Other | Admitting: Family Medicine

## 2013-10-19 ENCOUNTER — Encounter: Payer: Self-pay | Admitting: Family Medicine

## 2013-10-19 VITALS — BP 130/70 | HR 74 | Temp 98.0°F | Resp 16 | Wt 218.8 lb

## 2013-10-19 DIAGNOSIS — M79605 Pain in left leg: Secondary | ICD-10-CM

## 2013-10-19 DIAGNOSIS — M79609 Pain in unspecified limb: Secondary | ICD-10-CM

## 2013-10-19 DIAGNOSIS — I10 Essential (primary) hypertension: Secondary | ICD-10-CM | POA: Insufficient documentation

## 2013-10-19 DIAGNOSIS — E785 Hyperlipidemia, unspecified: Secondary | ICD-10-CM | POA: Insufficient documentation

## 2013-10-19 DIAGNOSIS — M79662 Pain in left lower leg: Secondary | ICD-10-CM | POA: Insufficient documentation

## 2013-10-19 NOTE — Patient Instructions (Addendum)
It is a pleasure to meet you both.  We will rule out a blood clot with an ultrasound. They will call you Do exercises in water aerobics and at night.  Wear compression at night.  Ice 20 minutes 2 times daily Start iron 325 mg daily.  capsacin or aspercream at night.  Vitamin D 2000 IU daily can help with muscle pain and strength.  Come back again in 3-4 weeks and we will check how you are doing.

## 2013-10-19 NOTE — Progress Notes (Signed)
Pre visit review using our clinic review tool, if applicable. No additional management support is needed unless otherwise documented below in the visit note. 

## 2013-10-19 NOTE — Assessment & Plan Note (Signed)
Patient is having left calf pain. The most likely diagnosis is a calf strain from compensating for her low back pain previously. Other differential includes lumbar radiculopathy causing some nerve impingement the patient has full strength and is neurovascularly intact. In addition this patient is concern for a deep venous thrombosis we will get an ultrasound I think this is low likelihood. Patient was given a compression sleeve today. Icing protocol and home exercise program. Patient will try over-the-counter medications as well and then come back and see me again in 3-4 weeks for further evaluation.

## 2013-10-19 NOTE — Progress Notes (Signed)
  Jasmin [name], Jasmin [name] [id number] Phone: [phone] Subjective:    I'm seeing this patient by the request  of:  Walker Kehr, MD   CC: Left leg pain  IRJ:[id number] Jasmin [name] is a 80 y.o. female coming in with complaint of left leg pain. Patient was seen previously on February 1 in the emergency department for lumbar back pain that was given to radiation down the left leg. Patient states after treatment for her low back pain that has completely resolved but unfortunately continued to have left leg pain. Patient states that the pain can start a size her hip and can radiate down but sometimes the hip pain goes away and is only on the lateral calf being the worst amount of pain. Patient states that this is more of a sharp pain and a dull aching sensation. Patient states it has made it difficult to ambulate secondary to the pain. Patient is walking with the aid of walker. Patient describes the pain as 7/10. Patient has been taking pain medication but has not noticed any significant improvement. Patient's husband who accompanies her has been doing massage which helps but then patient wakes up in the middle of the night with continued pain. Denies any swelling but they state from time to time it does feel warm.     Past medical history, social, surgical and family history all reviewed in electronic medical record.   Review of Systems: No headache, visual changes, nausea, vomiting, diarrhea, constipation, dizziness, abdominal pain, skin rash, fevers, chills, night sweats, weight loss, swollen lymph nodes, body aches, joint swelling, muscle aches, chest pain, shortness of breath, mood changes.   Objective Blood pressure 130/70, pulse 74, temperature 98 F (36.7 C), temperature source Oral, resp. rate 16, weight 218 lb 12.8 oz (99.247 kg), SpO2 96.00%.  General: No apparent distress alert and oriented x3 mood and affect normal, dressed  appropriately. Overweight HEENT: Pupils equal, extraocular movements intact  Respiratory: Patient's speak in full sentences and does not appear short of breath  Cardiovascular: No lower extremity edema, non tender, no erythema  Skin: Warm dry intact with no signs of infection or rash on extremities or on axial skeleton.  Abdomen: Soft nontender  Neuro: Cranial nerves II through XII are intact, neurovascularly intact in all extremities with 2+ DTRs and 2+ pulses.  Lymph: No lymphadenopathy of posterior or anterior cervical chain or axillae bilaterally.  Gait normal with good balance and coordination.  MSK:  Non tender with full range of motion and good stability and symmetric strength and tone of shoulders, elbows, wrist, hip, knee and ankles bilaterally. Moderate osteoarthritic changes of multiple joints. Patient's cast is tender to exam. Does not feel warm the patient is painful to palpation over the lateral gastrocnemius. No swelling noted. Full range of motion of the ankle and deep tendon reflexes are intact and 2+. Neurovascularly intact distally. Patient has a negative straight leg test and a negative Faber test. Minimally tender to palpation over the greater trochanteric area of the hip. Patient has good range of motion of the hip and is symmetric to contralateral side.   Impression and Recommendations:     This case required medical decision making of moderate complexity.

## 2013-10-24 ENCOUNTER — Ambulatory Visit: Payer: Medicare Other | Admitting: Family Medicine

## 2013-10-31 ENCOUNTER — Encounter (HOSPITAL_COMMUNITY): Payer: Self-pay | Admitting: Emergency Medicine

## 2013-10-31 ENCOUNTER — Emergency Department (INDEPENDENT_AMBULATORY_CARE_PROVIDER_SITE_OTHER)
Admission: EM | Admit: 2013-10-31 | Discharge: 2013-10-31 | Disposition: A | Discharge status: Home / Self Care | Payer: Medicare Other | Source: Home / Self Care | Attending: Family Medicine | Admitting: Family Medicine

## 2013-10-31 DIAGNOSIS — R040 Epistaxis: Secondary | ICD-10-CM

## 2013-10-31 NOTE — ED Provider Notes (Signed)
CSN: [id number]     Arrival date & time 10/31/13  1646 History   First MD Initiated Contact with Patient 10/31/13 1737     Chief Complaint  Patient presents with  . Epistaxis   (Consider location/radiation/quality/duration/timing/severity/associated sxs/prior Treatment) HPI Comments: 80 year old female presents for evaluation and treatment of a nosebleed. This nosebleed started approximately 2-1/2 hours ago. The patient is currently holding pressure on the bridge of her nose. It stopped while she was in the lobby and she attempted to leave, but it started bleeding again when she got up to walk to her car. The bleeding has been a slow trickle. She has not tried taking her hand of her nose in the past hour. She has a history of a nosebleed that was difficult to stop, she had to see an otolaryngologist before it stopped completely.  The patient removed her hand, her nose is no longer bleeding  Patient is a 80 y.o. female presenting with nosebleeds.  Epistaxis Associated symptoms: no cough, no dizziness and no fever     Past Medical History  Diagnosis Date  . Diverticulosis of colon   . Hyperlipidemia   . Hypertension   . GERD with stricture     Dr Henrene Pastor  . Vitamin B12 deficiency 2009    Borderline  . Hemorrhoids   . Hiatal hernia   . Polycystic kidney   . Esophageal stricture   . Tubulovillous adenoma polyp of colon   . Onychomycosis   . Personal history of urinary calculi   . Contact dermatitis and other eczema, due to unspecified cause   . Unspecified vitamin D deficiency    Past Surgical History  Procedure Laterality Date  . Tonsillectomy    . Dilation and curettage of uterus  1977  . Toe surgery      Right foot    Family History  Problem Relation Age of Onset  . Colon cancer Neg Hx   . Hypertension Father    History  Substance Use Topics  . Smoking status: Never Smoker   . Smokeless tobacco: Never Used  . Alcohol Use: No   OB History   Grav Para Term Preterm  Abortions TAB SAB Ect Mult Living                 Review of Systems  Constitutional: Negative for fever and chills.  HENT: Positive for nosebleeds.   Eyes: Negative for visual disturbance.  Respiratory: Negative for cough and shortness of breath.   Cardiovascular: Negative for chest pain, palpitations and leg swelling.  Gastrointestinal: Negative for nausea, vomiting and abdominal pain.  Endocrine: Negative for polydipsia and polyuria.  Genitourinary: Negative for dysuria, urgency and frequency.  Musculoskeletal: Negative for arthralgias and myalgias.  Skin: Negative for rash.  Neurological: Negative for dizziness, weakness and light-headedness.    Allergies  Clarithromycin; Penicillins; and Sulfonamide derivatives  Home Medications   Current Outpatient Rx  Name  Route  Sig  Dispense  Refill  . lovastatin (MEVACOR) 40 MG tablet      TAKE 1 TABLET (40 MG TOTAL) BY MOUTH AT BEDTIME.   90 tablet   3   . metoprolol (LOPRESSOR) 50 MG tablet      TAKE 1 TABLET BY MOUTH TWICE A DAY   180 tablet   1   . omeprazole (PRILOSEC) 20 MG capsule      TAKE 1 CAPSULE (20 MG TOTAL) BY MOUTH DAILY.   90 capsule   3   . polyethylene glycol (  GLYCOLAX) powder   Oral   Take 17 g by mouth daily as needed (constipation).          . triamterene-hydrochlorothiazide (MAXZIDE-25) 37.5-25 MG per tablet      TAKE 1 EACH (1 TABLET TOTAL) BY MOUTH DAILY.   90 tablet   3   . vitamin B-12 (CYANOCOBALAMIN) 1000 MCG tablet   Oral   Take 500 mcg by mouth daily.           . capsaicin (ZOSTRIX) 0.025 % cream   Topical   Apply topically 2 (two) times daily. On R foot   60 g   1   . Cholecalciferol 1000 UNITS tablet   Oral   Take 1,000 Units by mouth daily.           Marland Kitchen HYDROcodone-acetaminophen (NORCO/VICODIN) 5-325 MG per tablet   Oral   Take 1 tablet by mouth every 6 (six) hours as needed for moderate pain.   12 tablet   0   . HYDROcodone-acetaminophen (NORCO/VICODIN) 5-325 MG  per tablet   Oral   Take 1 tablet by mouth every 4 (four) hours as needed.   30 tablet   0   . hydrocortisone (ANUSOL-HC) 25 MG suppository   Rectal   Place 25 mg rectally 2 (two) times daily.         . hydrOXYzine (ATARAX/VISTARIL) 25 MG tablet   Oral   Take 1 tablet (25 mg total) by mouth 3 (three) times daily as needed for itching.   30 tablet   0   . mupirocin ointment (BACTROBAN) 2 %      Use bid on the wound   30 g   0   . predniSONE (DELTASONE) 20 MG tablet      3 Tabs PO Days 1-3, then 2 tabs PO Days 4-6, then 1 tab PO Day 7-9, then Half Tab PO Day 10-12   20 tablet   0   . triamcinolone ointment (KENALOG) 0.5 %   Topical   Apply topically 2 (two) times daily.   30 g   0    BP 151/76  Pulse 75  Temp(Src) 98.1 F (36.7 C) (Oral)  Resp 12  SpO2 97% Physical Exam  Nursing note and vitals reviewed. Constitutional: She is oriented to person, place, and time. Vital signs are normal. She appears well-developed and well-nourished. No distress.  HENT:  Head: Normocephalic and atraumatic.  Nose: No epistaxis (no bleeding vessel was identified in the nose).  There is blood in the posterior oropharynx, not actively wheezing.  Pulmonary/Chest: Effort normal. No respiratory distress.  Neurological: She is alert and oriented to person, place, and time. She has normal strength. Coordination normal.  Skin: Skin is warm and dry. No rash noted. She is not diaphoretic.  Psychiatric: She has a normal mood and affect. Judgment normal.    ED Course  Procedures (including critical care time) Labs Review Labs Reviewed - No data to display Imaging Review No results found.   MDM   1. Epistaxis    Had the patient drink some water to clear the blood from her posterior pharynx and recheck in 5 minutes. There is still a small amount of active bleeding that is shooting down the back of her throat. Than that her lower nose discharge remove any clots, there were none and her  nares are patent. I then gave her multiple sprays of oxymetazoline nasal spray and waited another 5 minutes. After 5 minutes, there is  no more bleeding in the posterior pharynx. All visible bleeding has stopped. She should be fine, she is stable for discharge. She should return if the bleeding starts again.       Liam Graham, PA-C 10/31/13 1849

## 2013-10-31 NOTE — ED Provider Notes (Signed)
Medical screening examination/treatment/procedure(s) were performed by a resident physician or non-physician practitioner and as the supervising physician I was immediately available for consultation/collaboration.  Mckaila Duffus, MD    Neela Zecca S Sandon Yoho, MD 10/31/13 2120 

## 2013-10-31 NOTE — Discharge Instructions (Signed)

## 2013-10-31 NOTE — ED Notes (Signed)
Patient complains of nose bleed that started at 3 pm this afternoon; states she does not take blood thinners or aspirin.

## 2013-11-02 ENCOUNTER — Ambulatory Visit (INDEPENDENT_AMBULATORY_CARE_PROVIDER_SITE_OTHER): Payer: Medicare Other | Admitting: Family Medicine

## 2013-11-02 ENCOUNTER — Encounter: Payer: Self-pay | Admitting: Family Medicine

## 2013-11-02 VITALS — BP 138/76 | HR 70 | Temp 98.1°F | Resp 16 | Wt 215.1 lb

## 2013-11-02 DIAGNOSIS — M79609 Pain in unspecified limb: Secondary | ICD-10-CM

## 2013-11-02 DIAGNOSIS — M79662 Pain in left lower leg: Secondary | ICD-10-CM

## 2013-11-02 NOTE — Patient Instructions (Signed)
It was nice seeing you today. Please continue to perform your water aerobics 3-4 x per week. As well, please try to do weight based training 1-2 x per week with small amounts of weight. Continue to ice once per day. Continue the iron once per day or every other day. I will see you back as needed.

## 2013-11-02 NOTE — Progress Notes (Signed)
Pre visit review using our clinic review tool, if applicable. No additional management support is needed unless otherwise documented below in the visit note. 

## 2013-11-03 ENCOUNTER — Encounter: Payer: Self-pay | Admitting: Family Medicine

## 2013-11-03 NOTE — Assessment & Plan Note (Signed)
Very impressed with the dedication of the patient to improve. Patient is doing remarkably well. Encourage her to continue to wear the compression when she is doing her exercises. We discussed weight lifting 1-2 times a week could also help her bone health with her only doing mostly the water aerobics. Patient at this time we'll continue to do the exercises 2 times a week for the next 6 weeks. Patient followup with me again in 6 weeks if he has any other complaints otherwise she can followup on an as-needed basis.

## 2013-11-03 NOTE — Progress Notes (Signed)
  [name] [place], [place] [address] Phone: [phone] Subjective:    CC: Left leg pain followup  DDU:[id number] CORBYN STEEDMAN is a 80 y.o. female coming in for followup of her left leg pain. Patient was previously seen and was diagnosed as more of a calf strain. We did rule out any type of deep venous thromboses. Patient states that the compression sleeve as well as the home exercises haven't improved tremendously where she is 100% better. Patient is doing water aerobics 3 times a week and states that it is helping him significantly. Patient is doing the other exercises that were given to her 3 times a week which has helped as well. Patient does notice that her low back pain is extremely better as well. Patient is very happy with the results.     Past medical history, social, surgical and family history all reviewed in electronic medical record.   Review of Systems: No headache, visual changes, nausea, vomiting, diarrhea, constipation, dizziness, abdominal pain, skin rash, fevers, chills, night sweats, weight loss, swollen lymph nodes, body aches, joint swelling, muscle aches, chest pain, shortness of breath, mood changes.   Objective Blood pressure 138/76, pulse 70, temperature 98.1 F (36.7 C), temperature source Oral, resp. rate 16, weight 215 lb 1.3 oz (97.56 kg), SpO2 96.00%.  General: No apparent distress alert and oriented x3 mood and affect normal, dressed appropriately. Overweight HEENT: Pupils equal, extraocular movements intact  Respiratory: Patient's speak in full sentences and does not appear short of breath  Cardiovascular: No lower extremity edema, non tender, no erythema  Skin: Warm dry intact with no signs of infection or rash on extremities or on axial skeleton.  Abdomen: Soft nontender  Neuro: Cranial nerves II through XII are intact, neurovascularly intact in all extremities with 2+ DTRs and 2+ pulses.  Lymph: No  lymphadenopathy of posterior or anterior cervical chain or axillae bilaterally.  Gait normal with good balance and coordination.  MSK:  Non tender with full range of motion and good stability and symmetric strength and tone of shoulders, elbows, wrist, hip, knee and ankles bilaterally. Moderate osteoarthritic changes of multiple joints. Patient's calf is nontender on exam. There is no swelling. Patient has full range of motion of the knee as well as ankle. Patient is ambulating well with good balance. Neurovascularly intact distally..   Impression and Recommendations:     This case required medical decision making of moderate complexity.

## 2013-11-29 ENCOUNTER — Encounter: Payer: Self-pay | Admitting: General Practice

## 2013-11-29 ENCOUNTER — Encounter: Payer: Self-pay | Admitting: Internal Medicine

## 2013-12-04 ENCOUNTER — Encounter: Payer: Self-pay | Admitting: Internal Medicine

## 2013-12-10 ENCOUNTER — Other Ambulatory Visit: Payer: Self-pay | Admitting: Internal Medicine

## 2013-12-18 ENCOUNTER — Ambulatory Visit (INDEPENDENT_AMBULATORY_CARE_PROVIDER_SITE_OTHER): Payer: Medicare Other | Admitting: Internal Medicine

## 2013-12-18 ENCOUNTER — Encounter: Payer: Self-pay | Admitting: Internal Medicine

## 2013-12-18 VITALS — BP 138/74 | HR 60 | Temp 97.3°F | Ht 66.0 in | Wt 218.2 lb

## 2013-12-18 DIAGNOSIS — R413 Other amnesia: Secondary | ICD-10-CM

## 2013-12-18 DIAGNOSIS — E538 Deficiency of other specified B group vitamins: Secondary | ICD-10-CM

## 2013-12-18 DIAGNOSIS — I10 Essential (primary) hypertension: Secondary | ICD-10-CM

## 2013-12-18 DIAGNOSIS — E559 Vitamin D deficiency, unspecified: Secondary | ICD-10-CM

## 2013-12-18 NOTE — Progress Notes (Signed)
Pre visit review using our clinic review tool, if applicable. No additional management support is needed unless otherwise documented below in the visit note. 

## 2013-12-18 NOTE — Assessment & Plan Note (Signed)
Better  

## 2013-12-18 NOTE — Progress Notes (Signed)
   Subjective:    HPI  F/u L schiatica pain - resolved.   The patient needs to address  chronic hypertension that has been well controlled with medicines; to address chronic  hyperlipidemia controlled with medicines as well; and to address GERD, B12 def. She is on Prilosec No memory issues lately  Wt Readings from Last 3 Encounters:  12/18/13 218 lb 4 oz (98.998 kg)  11/02/13 215 lb 1.3 oz (97.56 kg)  10/19/13 218 lb 12.8 oz (99.247 kg)   BP Readings from Last 3 Encounters:  12/18/13 138/74  11/02/13 138/76  10/31/13 151/76       Review of Systems  Constitutional: Negative for chills, activity change, appetite change, fatigue and unexpected weight change.  HENT: Negative for congestion, mouth sores and sinus pressure.   Eyes: Negative for visual disturbance.  Respiratory: Negative for cough and chest tightness.   Gastrointestinal: Positive for anal bleeding. Negative for nausea and abdominal pain.  Genitourinary: Negative for frequency, difficulty urinating and vaginal pain.  Musculoskeletal: Positive for back pain. Negative for gait problem.  Skin: Negative for pallor and rash.  Neurological: Negative for dizziness, tremors, weakness, numbness and headaches.  Psychiatric/Behavioral: Negative for confusion and sleep disturbance.       Objective:   Physical Exam  Constitutional: She appears well-developed. No distress.  obese  HENT:  Head: Normocephalic.  Right Ear: External ear normal.  Left Ear: External ear normal.  Nose: Nose normal.  Mouth/Throat: Oropharynx is clear and moist.  Eyes: Conjunctivae are normal. Pupils are equal, round, and reactive to light. Right eye exhibits no discharge. Left eye exhibits no discharge.  Neck: Normal range of motion. Neck supple. No JVD present. No tracheal deviation present. No thyromegaly present.  Cardiovascular: Normal rate, regular rhythm and normal heart sounds.   Pulmonary/Chest: No stridor. No respiratory distress. She  has no wheezes.  Abdominal: Soft. Bowel sounds are normal. She exhibits no distension and no mass. There is no tenderness. There is no rebound and no guarding.  Musculoskeletal: She exhibits tenderness. She exhibits no edema.  LS is tender on L   Lymphadenopathy:    She has no cervical adenopathy.  Neurological: She displays normal reflexes. No cranial nerve deficit. She exhibits normal muscle tone. Coordination normal.  Skin: No rash noted. No erythema.  Psychiatric: She has a normal mood and affect. Her behavior is normal. Judgment and thought content normal.    Lab Results  Component Value Date   WBC 3.0* 09/21/2013   HGB 13.4 09/21/2013   HCT 40.1 09/21/2013   PLT 134.0* 09/21/2013   GLUCOSE 88 09/21/2013   CHOL 173 05/22/2013   TRIG 116.0 05/22/2013   HDL 57.00 05/22/2013   LDLDIRECT 139.3 05/31/2012   LDLCALC 93 05/22/2013   ALT 15 05/22/2013   AST 18 05/22/2013   NA 138 09/21/2013   K 3.7 09/21/2013   CL 103 09/21/2013   CREATININE 1.1 09/21/2013   BUN 21 09/21/2013   CO2 28 09/21/2013   TSH 1.00 05/22/2013   INR 1.16 08/16/2009         Assessment & Plan:

## 2013-12-18 NOTE — Assessment & Plan Note (Signed)
Continue with current prescription therapy as reflected on the Med list.  

## 2013-12-19 ENCOUNTER — Telehealth: Payer: Self-pay | Admitting: Internal Medicine

## 2013-12-19 NOTE — Telephone Encounter (Signed)
Relevant patient education assigned to patient using Emmi. ° °

## 2014-01-15 ENCOUNTER — Ambulatory Visit (AMBULATORY_SURGERY_CENTER): Payer: Self-pay | Admitting: *Deleted

## 2014-01-15 VITALS — Ht 66.0 in | Wt 222.0 lb

## 2014-01-15 DIAGNOSIS — Z860100 Personal history of colon polyps, unspecified: Secondary | ICD-10-CM

## 2014-01-15 MED ORDER — MOVIPREP 100 G PO SOLR: ORAL | Status: DC

## 2014-01-15 NOTE — Progress Notes (Signed)
No allergies to eggs or soy. No problems with anesthesia.  Pt given Emmi instructions for colonoscopy  No oxygen use  No diet drug use  

## 2014-01-24 ENCOUNTER — Encounter: Payer: Self-pay | Admitting: Internal Medicine

## 2014-01-24 ENCOUNTER — Ambulatory Visit (AMBULATORY_SURGERY_CENTER): Payer: Medicare Other | Admitting: Internal Medicine

## 2014-01-24 VITALS — BP 126/68 | HR 59 | Temp 96.7°F | Resp 11 | Ht 66.0 in | Wt 222.0 lb

## 2014-01-24 DIAGNOSIS — Z86010 Personal history of colonic polyps: Secondary | ICD-10-CM

## 2014-01-24 DIAGNOSIS — D126 Benign neoplasm of colon, unspecified: Secondary | ICD-10-CM

## 2014-01-24 MED ORDER — SODIUM CHLORIDE 0.9 % IV SOLN: 500.0000 mL | INTRAVENOUS | Status: DC

## 2014-01-24 NOTE — Op Note (Signed)
[facility]  [address] [place], [address]   COLONOSCOPY PROCEDURE REPORT  PATIENT: Jasmin, [name]  MR#: [id number] BIRTHDATE: Jul 04, 1934 , 80  yrs. old GENDER: Female ENDOSCOPIST: Eustace Quail, MD REFERRED CW:[id number] Program Recall PROCEDURE DATE:  01/24/2014 PROCEDURE:   Colonoscopy with snare polypectomy x 2 First Screening Colonoscopy - Avg.  risk and is 50 yrs.  old or older - No.  Prior Negative Screening - Now for repeat screening. N/A  History of Adenoma - Now for follow-up colonoscopy & has been > or = to 3 yrs.  Yes hx of adenoma.  Has been 3 or more years since last colonoscopy.  Polyps Removed Today? Yes. ASA CLASS:   Class II INDICATIONS:Patient's personal history of adenomatous colon polyps. 2011 with 10 adenomas; 2012  with1 adenoma. MEDICATIONS: MAC sedation, administered by CRNA and propofol (Diprivan) 200mg  IV  DESCRIPTION OF PROCEDURE:   After the risks benefits and alternatives of the procedure were thoroughly explained, informed consent was obtained.  A digital rectal exam revealed no abnormalities of the rectum.   The LB [id number]  endoscope was introduced through the anus and advanced to the cecum, which was identified by both the appendix and ileocecal valve. No adverse events experienced.   The quality of the prep was excellent, using MoviPrep  The instrument was then slowly withdrawn as the colon was fully examined.  COLON FINDINGS: Two diminutive polyps were found in the ascending and transverse colon.  A polypectomy was performed with a cold snare.  The resection was complete and the polyp tissue was completely retrieved.   Moderate diverticulosis was noted throughout the entire examined colon.   The colon mucosa was otherwise normal.  Retroflexed views revealed external hemorrhoids. The time to cecum=3 minutes 10 seconds.  Withdrawal time=8 minutes 59 seconds.  The scope was withdrawn and the procedure  completed. COMPLICATIONS: There were no complications.  ENDOSCOPIC IMPRESSION: 1.   Two diminutive polyps were found in the ascending  and transverse colon; polypectomy was performed with a cold snare 2.   Moderate diverticulosis was noted throughout the entire examined colon 3.   The colon mucosa was otherwise normal  RECOMMENDATIONS: 1. Repeat Colonoscopy in 3 years (personal history of multiple adenomas).   eSigned:  Eustace Quail, MD 01/24/2014 12:45 PM   cc: [facility] [place].  [place], MD and The Patient

## 2014-01-24 NOTE — Progress Notes (Signed)
Procedure ends, to recovery, report given and VSS. 

## 2014-01-24 NOTE — Patient Instructions (Signed)
Discharge instructions given with verbal understanding. Handouts on polyps and diverticulosis. Resume previous medications. YOU HAD AN ENDOSCOPIC PROCEDURE TODAY AT THE [facility]: Refer to the procedure report that was given to you for any specific questions about what was found during the examination.  If the procedure report does not answer your questions, please call your gastroenterologist to clarify.  If you requested that your care partner not be given the details of your procedure findings, then the procedure report has been included in a sealed envelope for you to review at your convenience later.  YOU SHOULD EXPECT: Some feelings of bloating in the abdomen. Passage of more gas than usual.  Walking can help get rid of the air that was put into your GI tract during the procedure and reduce the bloating. If you had a lower endoscopy (such as a colonoscopy or flexible sigmoidoscopy) you may notice spotting of blood in your stool or on the toilet paper. If you underwent a bowel prep for your procedure, then you may not have a normal bowel movement for a few days.  DIET: Your first meal following the procedure should be a light meal and then it is ok to progress to your normal diet.  A half-sandwich or bowl of soup is an example of a good first meal.  Heavy or fried foods are harder to digest and may make you feel nauseous or bloated.  Likewise meals heavy in dairy and vegetables can cause extra gas to form and this can also increase the bloating.  Drink plenty of fluids but you should avoid alcoholic beverages for 24 hours.  ACTIVITY: Your care partner should take you home directly after the procedure.  You should plan to take it easy, moving slowly for the rest of the day.  You can resume normal activity the day after the procedure however you should NOT DRIVE or use heavy machinery for 24 hours (because of the sedation medicines used during the test).    SYMPTOMS TO REPORT  IMMEDIATELY: A gastroenterologist can be reached at any hour.  During normal business hours, 8:30 AM to 5:00 PM Monday through Friday, call [phone].  After hours and on weekends, please call the GI answering service at [phone] who will take a message and have the physician on call contact you.   Following lower endoscopy (colonoscopy or flexible sigmoidoscopy):  Excessive amounts of blood in the stool  Significant tenderness or worsening of abdominal pains  Swelling of the abdomen that is new, acute  Fever of 100F or higher  FOLLOW UP: If any biopsies were taken you will be contacted by phone or by letter within the next 1-3 weeks.  Call your gastroenterologist if you have not heard about the biopsies in 3 weeks.  Our staff will call the home number listed on your records the next business day following your procedure to check on you and address any questions or concerns that you may have at that time regarding the information given to you following your procedure. This is a courtesy call and so if there is no answer at the home number and we have not heard from you through the emergency physician on call, we will assume that you have returned to your regular daily activities without incident.  SIGNATURES/CONFIDENTIALITY: You and/or your care partner have signed paperwork which will be entered into your electronic medical record.  These signatures attest to the fact that that the information above on your After Visit Summary   has been reviewed and is understood.  Full responsibility of the confidentiality of this discharge information lies with you and/or your care-partner. 

## 2014-01-24 NOTE — Progress Notes (Signed)
Called to room to assist during endoscopic procedure.  Patient ID and intended procedure confirmed with present staff. Received instructions for my participation in the procedure from the performing physician.  

## 2014-01-25 ENCOUNTER — Telehealth: Payer: Self-pay

## 2014-01-25 NOTE — Telephone Encounter (Signed)
  Follow up Call-  Call back number 01/24/2014  Post procedure Call Back phone  # [phone]  Permission to leave phone message Yes     Patient questions:  Do you have a fever, pain , or abdominal swelling? no Pain Score  0 *  Have you tolerated food without any problems? yes  Have you been able to return to your normal activities? yes  Do you have any questions about your discharge instructions: Diet   no Medications  no Follow up visit  no  Do you have questions or concerns about your Care? no  Actions: * If pain score is 4 or above: No action needed, pain <4.

## 2014-01-29 ENCOUNTER — Encounter: Payer: Self-pay | Admitting: Internal Medicine

## 2014-05-29 ENCOUNTER — Encounter: Payer: Self-pay | Admitting: Internal Medicine

## 2014-05-29 ENCOUNTER — Other Ambulatory Visit (INDEPENDENT_AMBULATORY_CARE_PROVIDER_SITE_OTHER): Payer: Medicare Other

## 2014-05-29 ENCOUNTER — Ambulatory Visit (INDEPENDENT_AMBULATORY_CARE_PROVIDER_SITE_OTHER): Payer: Medicare Other | Admitting: Internal Medicine

## 2014-05-29 VITALS — BP 130/74 | HR 76 | Temp 97.9°F | Resp 16 | Ht 65.75 in | Wt 224.0 lb

## 2014-05-29 DIAGNOSIS — I10 Essential (primary) hypertension: Secondary | ICD-10-CM

## 2014-05-29 DIAGNOSIS — K2100 Gastro-esophageal reflux disease with esophagitis, without bleeding: Secondary | ICD-10-CM

## 2014-05-29 DIAGNOSIS — M545 Low back pain: Secondary | ICD-10-CM

## 2014-05-29 DIAGNOSIS — Z23 Encounter for immunization: Secondary | ICD-10-CM

## 2014-05-29 DIAGNOSIS — Z0000 Encounter for general adult medical examination without abnormal findings: Secondary | ICD-10-CM

## 2014-05-29 DIAGNOSIS — E669 Obesity, unspecified: Secondary | ICD-10-CM

## 2014-05-29 DIAGNOSIS — E785 Hyperlipidemia, unspecified: Secondary | ICD-10-CM

## 2014-05-29 LAB — BASIC METABOLIC PANEL
BUN: 22 mg/dL (ref 6–23)
CALCIUM: 9.5 mg/dL (ref 8.4–10.5)
CHLORIDE: 104 meq/L (ref 96–112)
CO2: 26 meq/L (ref 19–32)
CREATININE: 1.1 mg/dL (ref 0.4–1.2)
GFR: 61.7 mL/min (ref >60.00)
Glucose, Bld: 91 mg/dL (ref 70–99)
Potassium: 3.8 mEq/L (ref 3.5–5.1)
Sodium: 140 mEq/L (ref 135–145)

## 2014-05-29 LAB — URINALYSIS
Bilirubin Urine: NEGATIVE
HGB URINE DIPSTICK: NEGATIVE
Ketones, ur: NEGATIVE
LEUKOCYTES UA: NEGATIVE
Nitrite: POSITIVE — AB
Specific Gravity, Urine: 1.025 (ref 1.000–1.030)
Total Protein, Urine: NEGATIVE
URINE GLUCOSE: NEGATIVE
UROBILINOGEN UA: 0.2 (ref 0.0–1.0)
pH: 5.5 (ref 5.0–8.0)

## 2014-05-29 LAB — HEPATIC FUNCTION PANEL
ALBUMIN: 3.6 g/dL (ref 3.5–5.2)
ALT: 22 U/L (ref 0–35)
AST: 24 U/L (ref 0–37)
Alkaline Phosphatase: 48 U/L (ref 39–117)
BILIRUBIN DIRECT: 0.1 mg/dL (ref 0.0–0.3)
TOTAL PROTEIN: 7.5 g/dL (ref 6.0–8.3)
Total Bilirubin: 0.7 mg/dL (ref 0.2–1.2)

## 2014-05-29 LAB — URINALYSIS, ROUTINE W REFLEX MICROSCOPIC
BILIRUBIN URINE: NEGATIVE
HGB URINE DIPSTICK: NEGATIVE
Ketones, ur: NEGATIVE
Leukocytes, UA: NEGATIVE
NITRITE: POSITIVE — AB
RBC / HPF: NONE SEEN (ref 0–?)
Specific Gravity, Urine: 1.025 (ref 1.000–1.030)
TOTAL PROTEIN, URINE-UPE24: NEGATIVE
UROBILINOGEN UA: 0.2 (ref 0.0–1.0)
Urine Glucose: NEGATIVE
pH: 5.5 (ref 5.0–8.0)

## 2014-05-29 LAB — LIPID PANEL
CHOL/HDL RATIO: 4
Cholesterol: 176 mg/dL (ref 0–200)
HDL: 45.2 mg/dL (ref >39.00)
LDL Cholesterol: 104 mg/dL — ABNORMAL HIGH (ref 0–99)
NONHDL: 130.8
Triglycerides: 136 mg/dL (ref 0.0–149.0)
VLDL: 27.2 mg/dL (ref 0.0–40.0)

## 2014-05-29 LAB — CBC WITH DIFFERENTIAL/PLATELET
BASOS PCT: 0.4 % (ref 0.0–3.0)
Basophils Absolute: 0 10*3/uL (ref 0.0–0.1)
EOS PCT: 0.5 % (ref 0.0–5.0)
Eosinophils Absolute: 0 10*3/uL (ref 0.0–0.7)
HCT: 40 % (ref 36.0–46.0)
HEMOGLOBIN: 13.4 g/dL (ref 12.0–15.0)
LYMPHS PCT: 48.3 % — AB (ref 12.0–46.0)
Lymphs Abs: 1.5 10*3/uL (ref 0.7–4.0)
MCHC: 33.5 g/dL (ref 30.0–36.0)
MCV: 93.8 fl (ref 78.0–100.0)
Monocytes Absolute: 0.3 10*3/uL (ref 0.1–1.0)
Monocytes Relative: 9.7 % (ref 3.0–12.0)
NEUTROS ABS: 1.3 10*3/uL — AB (ref 1.4–7.7)
NEUTROS PCT: 41.1 % — AB (ref 43.0–77.0)
Platelets: 120 10*3/uL — ABNORMAL LOW (ref 150.0–400.0)
RBC: 4.26 Mil/uL (ref 3.87–5.11)
RDW: 15.1 % (ref 11.5–15.5)
WBC: 3 10*3/uL — AB (ref 4.0–10.5)

## 2014-05-29 LAB — TSH: TSH: 1.66 u[IU]/mL (ref 0.35–4.50)

## 2014-05-29 NOTE — Progress Notes (Signed)
Pre visit review using our clinic review tool, if applicable. No additional management support is needed unless otherwise documented below in the visit note. 

## 2014-05-29 NOTE — Assessment & Plan Note (Signed)
Doing well 

## 2014-05-29 NOTE — Assessment & Plan Note (Signed)
Continue with current prescription therapy as reflected on the Med list.  

## 2014-05-29 NOTE — Assessment & Plan Note (Signed)
Wt Readings from Last 3 Encounters:  05/29/14 224 lb (101.606 kg)  01/24/14 222 lb (100.699 kg)  01/15/14 222 lb (100.699 kg)

## 2014-05-29 NOTE — Assessment & Plan Note (Signed)
Continue with current prescription therapy as reflected on the Med list. BP Readings from Last 3 Encounters:  05/29/14 130/74  01/24/14 126/68  12/18/13 138/74

## 2014-05-29 NOTE — Assessment & Plan Note (Signed)
Chronic; on Prevacid doing well

## 2014-05-29 NOTE — Progress Notes (Signed)
   Subjective:    HPI  The patient is here for a wellness exam. The patient has been doing well overall without major physical or psychological issues going on lately.  F/u L schiatica pain - resolved.   The patient needs to address  chronic hypertension that has been well controlled with medicines; to address chronic  hyperlipidemia controlled with medicines as well; and to address GERD, B12 def. She is on Prilosec No memory issues lately  Wt Readings from Last 3 Encounters:  05/29/14 224 lb (101.606 kg)  01/24/14 222 lb (100.699 kg)  01/15/14 222 lb (100.699 kg)   BP Readings from Last 3 Encounters:  05/29/14 130/74  01/24/14 126/68  12/18/13 138/74       Review of Systems  Constitutional: Negative for chills, activity change, appetite change, fatigue and unexpected weight change.  HENT: Negative for congestion, mouth sores and sinus pressure.   Eyes: Negative for visual disturbance.  Respiratory: Negative for cough and chest tightness.   Gastrointestinal: Positive for anal bleeding. Negative for nausea and abdominal pain.  Genitourinary: Negative for frequency, difficulty urinating and vaginal pain.  Musculoskeletal: Positive for back pain. Negative for gait problem.  Skin: Negative for pallor and rash.  Neurological: Negative for dizziness, tremors, weakness, numbness and headaches.  Psychiatric/Behavioral: Negative for confusion and sleep disturbance.       Objective:   Physical Exam  Constitutional: She appears well-developed. No distress.  obese  HENT:  Head: Normocephalic.  Right Ear: External ear normal.  Left Ear: External ear normal.  Nose: Nose normal.  Mouth/Throat: Oropharynx is clear and moist.  Eyes: Conjunctivae are normal. Pupils are equal, round, and reactive to light. Right eye exhibits no discharge. Left eye exhibits no discharge.  Neck: Normal range of motion. Neck supple. No JVD present. No tracheal deviation present. No thyromegaly present.   Cardiovascular: Normal rate, regular rhythm and normal heart sounds.   Pulmonary/Chest: No stridor. No respiratory distress. She has no wheezes.  Abdominal: Soft. Bowel sounds are normal. She exhibits no distension and no mass. There is no tenderness. There is no rebound and no guarding.  Musculoskeletal: She exhibits tenderness. She exhibits no edema.  LS is tender on L   Lymphadenopathy:    She has no cervical adenopathy.  Neurological: She displays normal reflexes. No cranial nerve deficit. She exhibits normal muscle tone. Coordination normal.  Skin: No rash noted. No erythema.  Psychiatric: She has a normal mood and affect. Her behavior is normal. Judgment and thought content normal.    Lab Results  Component Value Date   WBC 3.0* 09/21/2013   HGB 13.4 09/21/2013   HCT 40.1 09/21/2013   PLT 134.0* 09/21/2013   GLUCOSE 88 09/21/2013   CHOL 173 05/22/2013   TRIG 116.0 05/22/2013   HDL 57.00 05/22/2013   LDLDIRECT 139.3 05/31/2012   LDLCALC 93 05/22/2013   ALT 15 05/22/2013   AST 18 05/22/2013   NA 138 09/21/2013   K 3.7 09/21/2013   CL 103 09/21/2013   CREATININE 1.1 09/21/2013   BUN 21 09/21/2013   CO2 28 09/21/2013   TSH 1.00 05/22/2013   INR 1.16 08/16/2009         Assessment & Plan:

## 2014-05-29 NOTE — Patient Instructions (Signed)
Preventive Care for Adults A healthy lifestyle and preventive care can promote health and wellness. Preventive health guidelines for women include the following key practices.  A routine yearly physical is a good way to check with your health care provider about your health and preventive screening. It is a chance to share any concerns and updates on your health and to receive a thorough exam.  Visit your dentist for a routine exam and preventive care every 6 months. Brush your teeth twice a day and floss once a day. Good oral hygiene prevents tooth decay and gum disease.  The frequency of eye exams is based on your age, health, family medical history, use of contact lenses, and other factors. Follow your health care provider's recommendations for frequency of eye exams.  Eat a healthy diet. Foods like vegetables, fruits, whole grains, low-fat dairy products, and lean protein foods contain the nutrients you need without too many calories. Decrease your intake of foods high in solid fats, added sugars, and salt. Eat the right amount of calories for you.Get information about a proper diet from your health care provider, if necessary.  Regular physical exercise is one of the most important things you can do for your health. Most adults should get at least 150 minutes of moderate-intensity exercise (any activity that increases your heart rate and causes you to sweat) each week. In addition, most adults need muscle-strengthening exercises on 2 or more days a week.  Maintain a healthy weight. The body mass index (BMI) is a screening tool to identify possible weight problems. It provides an estimate of body fat based on height and weight. Your health care provider can find your BMI and can help you achieve or maintain a healthy weight.For adults 20 years and older:  A BMI below 18.5 is considered underweight.  A BMI of 18.5 to 24.9 is normal.  A BMI of 25 to 29.9 is considered overweight.  A BMI of  30 and above is considered obese.  Maintain normal blood lipids and cholesterol levels by exercising and minimizing your intake of saturated fat. Eat a balanced diet with plenty of fruit and vegetables. Blood tests for lipids and cholesterol should begin at age 76 and be repeated every 5 years. If your lipid or cholesterol levels are high, you are over 50, or you are at high risk for heart disease, you may need your cholesterol levels checked more frequently.Ongoing high lipid and cholesterol levels should be treated with medicines if diet and exercise are not working.  If you smoke, find out from your health care provider how to quit. If you do not use tobacco, do not start.  Lung cancer screening is recommended for adults aged 22-80 years who are at high risk for developing lung cancer because of a history of smoking. A yearly low-dose CT scan of the lungs is recommended for people who have at least a 30-pack-year history of smoking and are a current smoker or have quit within the past 15 years. A pack year of smoking is smoking an average of 1 pack of cigarettes a day for 1 year (for example: 1 pack a day for 30 years or 2 packs a day for 15 years). Yearly screening should continue until the smoker has stopped smoking for at least 15 years. Yearly screening should be stopped for people who develop a health problem that would prevent them from having lung cancer treatment.  If you are pregnant, do not drink alcohol. If you are breastfeeding,  be very cautious about drinking alcohol. If you are not pregnant and choose to drink alcohol, do not have more than 1 drink per day. One drink is considered to be 12 ounces (355 mL) of beer, 5 ounces (148 mL) of wine, or 1.5 ounces (44 mL) of liquor.  Avoid use of street drugs. Do not share needles with anyone. Ask for help if you need support or instructions about stopping the use of drugs.  High blood pressure causes heart disease and increases the risk of  stroke. Your blood pressure should be checked at least every 1 to 2 years. Ongoing high blood pressure should be treated with medicines if weight loss and exercise do not work.  If you are 3-86 years old, ask your health care provider if you should take aspirin to prevent strokes.  Diabetes screening involves taking a blood sample to check your fasting blood sugar level. This should be done once every 3 years, after age 67, if you are within normal weight and without risk factors for diabetes. Testing should be considered at a younger age or be carried out more frequently if you are overweight and have at least 1 risk factor for diabetes.  Breast cancer screening is essential preventive care for women. You should practice "breast self-awareness." This means understanding the normal appearance and feel of your breasts and may include breast self-examination. Any changes detected, no matter how small, should be reported to a health care provider. Women in their 8s and 30s should have a clinical breast exam (CBE) by a health care provider as part of a regular health exam every 1 to 3 years. After age 70, women should have a CBE every year. Starting at age 25, women should consider having a mammogram (breast X-ray test) every year. Women who have a family history of breast cancer should talk to their health care provider about genetic screening. Women at a high risk of breast cancer should talk to their health care providers about having an MRI and a mammogram every year.  Breast cancer gene (BRCA)-related cancer risk assessment is recommended for women who have family members with BRCA-related cancers. BRCA-related cancers include breast, ovarian, tubal, and peritoneal cancers. Having family members with these cancers may be associated with an increased risk for harmful changes (mutations) in the breast cancer genes BRCA1 and BRCA2. Results of the assessment will determine the need for genetic counseling and  BRCA1 and BRCA2 testing.  Routine pelvic exams to screen for cancer are no longer recommended for nonpregnant women who are considered low risk for cancer of the pelvic organs (ovaries, uterus, and vagina) and who do not have symptoms. Ask your health care provider if a screening pelvic exam is right for you.  If you have had past treatment for cervical cancer or a condition that could lead to cancer, you need Pap tests and screening for cancer for at least 20 years after your treatment. If Pap tests have been discontinued, your risk factors (such as having a new sexual partner) need to be reassessed to determine if screening should be resumed. Some women have medical problems that increase the chance of getting cervical cancer. In these cases, your health care provider may recommend more frequent screening and Pap tests.  The HPV test is an additional test that may be used for cervical cancer screening. The HPV test looks for the virus that can cause the cell changes on the cervix. The cells collected during the Pap test can be  tested for HPV. The HPV test could be used to screen women aged 30 years and older, and should be used in women of any age who have unclear Pap test results. After the age of 30, women should have HPV testing at the same frequency as a Pap test.  Colorectal cancer can be detected and often prevented. Most routine colorectal cancer screening begins at the age of 50 years and continues through age 75 years. However, your health care provider may recommend screening at an earlier age if you have risk factors for colon cancer. On a yearly basis, your health care provider may provide home test kits to check for hidden blood in the stool. Use of a small camera at the end of a tube, to directly examine the colon (sigmoidoscopy or colonoscopy), can detect the earliest forms of colorectal cancer. Talk to your health care provider about this at age 50, when routine screening begins. Direct  exam of the colon should be repeated every 5-10 years through age 75 years, unless early forms of pre-cancerous polyps or small growths are found.  People who are at an increased risk for hepatitis B should be screened for this virus. You are considered at high risk for hepatitis B if:  You were born in a country where hepatitis B occurs often. Talk with your health care provider about which countries are considered high risk.  Your parents were born in a high-risk country and you have not received a shot to protect against hepatitis B (hepatitis B vaccine).  You have HIV or AIDS.  You use needles to inject street drugs.  You live with, or have sex with, someone who has hepatitis B.  You get hemodialysis treatment.  You take certain medicines for conditions like cancer, organ transplantation, and autoimmune conditions.  Hepatitis C blood testing is recommended for all people born from 1945 through 1965 and any individual with known risks for hepatitis C.  Practice safe sex. Use condoms and avoid high-risk sexual practices to reduce the spread of sexually transmitted infections (STIs). STIs include gonorrhea, chlamydia, syphilis, trichomonas, herpes, HPV, and human immunodeficiency virus (HIV). Herpes, HIV, and HPV are viral illnesses that have no cure. They can result in disability, cancer, and death.  You should be screened for sexually transmitted illnesses (STIs) including gonorrhea and chlamydia if:  You are sexually active and are younger than 24 years.  You are older than 24 years and your health care provider tells you that you are at risk for this type of infection.  Your sexual activity has changed since you were last screened and you are at an increased risk for chlamydia or gonorrhea. Ask your health care provider if you are at risk.  If you are at risk of being infected with HIV, it is recommended that you take a prescription medicine daily to prevent HIV infection. This is  called preexposure prophylaxis (PrEP). You are considered at risk if:  You are a heterosexual woman, are sexually active, and are at increased risk for HIV infection.  You take drugs by injection.  You are sexually active with a partner who has HIV.  Talk with your health care provider about whether you are at high risk of being infected with HIV. If you choose to begin PrEP, you should first be tested for HIV. You should then be tested every 3 months for as long as you are taking PrEP.  Osteoporosis is a disease in which the bones lose minerals and strength   with aging. This can result in serious bone fractures or breaks. The risk of osteoporosis can be identified using a bone density scan. Women ages 65 years and over and women at risk for fractures or osteoporosis should discuss screening with their health care providers. Ask your health care provider whether you should take a calcium supplement or vitamin D to reduce the rate of osteoporosis.  Menopause can be associated with physical symptoms and risks. Hormone replacement therapy is available to decrease symptoms and risks. You should talk to your health care provider about whether hormone replacement therapy is right for you.  Use sunscreen. Apply sunscreen liberally and repeatedly throughout the day. You should seek shade when your shadow is shorter than you. Protect yourself by wearing long sleeves, pants, a wide-brimmed hat, and sunglasses year round, whenever you are outdoors.  Once a month, do a whole body skin exam, using a mirror to look at the skin on your back. Tell your health care provider of new moles, moles that have irregular borders, moles that are larger than a pencil eraser, or moles that have changed in shape or color.  Stay current with required vaccines (immunizations).  Influenza vaccine. All adults should be immunized every year.  Tetanus, diphtheria, and acellular pertussis (Td, Tdap) vaccine. Pregnant women should  receive 1 dose of Tdap vaccine during each pregnancy. The dose should be obtained regardless of the length of time since the last dose. Immunization is preferred during the 27th-36th week of gestation. An adult who has not previously received Tdap or who does not know her vaccine status should receive 1 dose of Tdap. This initial dose should be followed by tetanus and diphtheria toxoids (Td) booster doses every 10 years. Adults with an unknown or incomplete history of completing a 3-dose immunization series with Td-containing vaccines should begin or complete a primary immunization series including a Tdap dose. Adults should receive a Td booster every 10 years.  Varicella vaccine. An adult without evidence of immunity to varicella should receive 2 doses or a second dose if she has previously received 1 dose. Pregnant females who do not have evidence of immunity should receive the first dose after pregnancy. This first dose should be obtained before leaving the health care facility. The second dose should be obtained 4-8 weeks after the first dose.  Human papillomavirus (HPV) vaccine. Females aged 13-26 years who have not received the vaccine previously should obtain the 3-dose series. The vaccine is not recommended for use in pregnant females. However, pregnancy testing is not needed before receiving a dose. If a female is found to be pregnant after receiving a dose, no treatment is needed. In that case, the remaining doses should be delayed until after the pregnancy. Immunization is recommended for any person with an immunocompromised condition through the age of 26 years if she did not get any or all doses earlier. During the 3-dose series, the second dose should be obtained 4-8 weeks after the first dose. The third dose should be obtained 24 weeks after the first dose and 16 weeks after the second dose.  Zoster vaccine. One dose is recommended for adults aged 60 years or older unless certain conditions are  present.  Measles, mumps, and rubella (MMR) vaccine. Adults born before 1957 generally are considered immune to measles and mumps. Adults born in 1957 or later should have 1 or more doses of MMR vaccine unless there is a contraindication to the vaccine or there is laboratory evidence of immunity to   each of the three diseases. A routine second dose of MMR vaccine should be obtained at least 28 days after the first dose for students attending postsecondary schools, health care workers, or international travelers. People who received inactivated measles vaccine or an unknown type of measles vaccine during 1963-1967 should receive 2 doses of MMR vaccine. People who received inactivated mumps vaccine or an unknown type of mumps vaccine before 1979 and are at high risk for mumps infection should consider immunization with 2 doses of MMR vaccine. For females of childbearing age, rubella immunity should be determined. If there is no evidence of immunity, females who are not pregnant should be vaccinated. If there is no evidence of immunity, females who are pregnant should delay immunization until after pregnancy. Unvaccinated health care workers born before 1957 who lack laboratory evidence of measles, mumps, or rubella immunity or laboratory confirmation of disease should consider measles and mumps immunization with 2 doses of MMR vaccine or rubella immunization with 1 dose of MMR vaccine.  Pneumococcal 13-valent conjugate (PCV13) vaccine. When indicated, a person who is uncertain of her immunization history and has no record of immunization should receive the PCV13 vaccine. An adult aged 19 years or older who has certain medical conditions and has not been previously immunized should receive 1 dose of PCV13 vaccine. This PCV13 should be followed with a dose of pneumococcal polysaccharide (PPSV23) vaccine. The PPSV23 vaccine dose should be obtained at least 8 weeks after the dose of PCV13 vaccine. An adult aged 19  years or older who has certain medical conditions and previously received 1 or more doses of PPSV23 vaccine should receive 1 dose of PCV13. The PCV13 vaccine dose should be obtained 1 or more years after the last PPSV23 vaccine dose.  Pneumococcal polysaccharide (PPSV23) vaccine. When PCV13 is also indicated, PCV13 should be obtained first. All adults aged 65 years and older should be immunized. An adult younger than age 65 years who has certain medical conditions should be immunized. Any person who resides in a nursing home or long-term care facility should be immunized. An adult smoker should be immunized. People with an immunocompromised condition and certain other conditions should receive both PCV13 and PPSV23 vaccines. People with human immunodeficiency virus (HIV) infection should be immunized as soon as possible after diagnosis. Immunization during chemotherapy or radiation therapy should be avoided. Routine use of PPSV23 vaccine is not recommended for American Indians, [place] Natives, or people younger than 65 years unless there are medical conditions that require PPSV23 vaccine. When indicated, people who have unknown immunization and have no record of immunization should receive PPSV23 vaccine. One-time revaccination 5 years after the first dose of PPSV23 is recommended for people aged 19-64 years who have chronic kidney failure, nephrotic syndrome, asplenia, or immunocompromised conditions. People who received 1-2 doses of PPSV23 before age 65 years should receive another dose of PPSV23 vaccine at age 65 years or later if at least 5 years have passed since the previous dose. Doses of PPSV23 are not needed for people immunized with PPSV23 at or after age 65 years.  Meningococcal vaccine. Adults with asplenia or persistent complement component deficiencies should receive 2 doses of quadrivalent meningococcal conjugate (MenACWY-D) vaccine. The doses should be obtained at least 2 months apart.  Microbiologists working with certain meningococcal bacteria, military recruits, people at risk during an outbreak, and people who travel to or live in countries with a high rate of meningitis should be immunized. A first-year college student up through age   21 years who is living in a residence hall should receive a dose if she did not receive a dose on or after her 16th birthday. Adults who have certain high-risk conditions should receive one or more doses of vaccine.  Hepatitis A vaccine. Adults who wish to be protected from this disease, have certain high-risk conditions, work with hepatitis A-infected animals, work in hepatitis A research labs, or travel to or work in countries with a high rate of hepatitis A should be immunized. Adults who were previously unvaccinated and who anticipate close contact with an international adoptee during the first 60 days after arrival in the [place] from a country with a high rate of hepatitis A should be immunized.  Hepatitis B vaccine. Adults who wish to be protected from this disease, have certain high-risk conditions, may be exposed to blood or other infectious body fluids, are household contacts or sex partners of hepatitis B positive people, are clients or workers in certain care facilities, or travel to or work in countries with a high rate of hepatitis B should be immunized.  Haemophilus influenzae type b (Hib) vaccine. A previously unvaccinated person with asplenia or sickle cell disease or having a scheduled splenectomy should receive 1 dose of Hib vaccine. Regardless of previous immunization, a recipient of a hematopoietic stem cell transplant should receive a 3-dose series 6-12 months after her successful transplant. Hib vaccine is not recommended for adults with HIV infection. Preventive Services / Frequency Ages 64 to 68 years  Blood pressure check.** / Every 1 to 2 years.  Lipid and cholesterol check.** / Every 5 years beginning at age  22.  Clinical breast exam.** / Every 3 years for women in their 88s and 53s.  BRCA-related cancer risk assessment.** / For women who have family members with a BRCA-related cancer (breast, ovarian, tubal, or peritoneal cancers).  Pap test.** / Every 2 years from ages 90 through 51. Every 3 years starting at age 21 through age 56 or 3 with a history of 3 consecutive normal Pap tests.  HPV screening.** / Every 3 years from ages 24 through ages 1 to 46 with a history of 3 consecutive normal Pap tests.  Hepatitis C blood test.** / For any individual with known risks for hepatitis C.  Skin self-exam. / Monthly.  Influenza vaccine. / Every year.  Tetanus, diphtheria, and acellular pertussis (Tdap, Td) vaccine.** / Consult your health care provider. Pregnant women should receive 1 dose of Tdap vaccine during each pregnancy. 1 dose of Td every 10 years.  Varicella vaccine.** / Consult your health care provider. Pregnant females who do not have evidence of immunity should receive the first dose after pregnancy.  HPV vaccine. / 3 doses over 6 months, if 72 and younger. The vaccine is not recommended for use in pregnant females. However, pregnancy testing is not needed before receiving a dose.  Measles, mumps, rubella (MMR) vaccine.** / You need at least 1 dose of MMR if you were born in 1957 or later. You may also need a 2nd dose. For females of childbearing age, rubella immunity should be determined. If there is no evidence of immunity, females who are not pregnant should be vaccinated. If there is no evidence of immunity, females who are pregnant should delay immunization until after pregnancy.  Pneumococcal 13-valent conjugate (PCV13) vaccine.** / Consult your health care provider.  Pneumococcal polysaccharide (PPSV23) vaccine.** / 1 to 2 doses if you smoke cigarettes or if you have certain conditions.  Meningococcal vaccine.** /  1 dose if you are age 19 to 21 years and a first-year college  student living in a residence hall, or have one of several medical conditions, you need to get vaccinated against meningococcal disease. You may also need additional booster doses.  Hepatitis A vaccine.** / Consult your health care provider.  Hepatitis B vaccine.** / Consult your health care provider.  Haemophilus influenzae type b (Hib) vaccine.** / Consult your health care provider. Ages 40 to 64 years  Blood pressure check.** / Every 1 to 2 years.  Lipid and cholesterol check.** / Every 5 years beginning at age 20 years.  Lung cancer screening. / Every year if you are aged 55-80 years and have a 30-pack-year history of smoking and currently smoke or have quit within the past 15 years. Yearly screening is stopped once you have quit smoking for at least 15 years or develop a health problem that would prevent you from having lung cancer treatment.  Clinical breast exam.** / Every year after age 40 years.  BRCA-related cancer risk assessment.** / For women who have family members with a BRCA-related cancer (breast, ovarian, tubal, or peritoneal cancers).  Mammogram.** / Every year beginning at age 40 years and continuing for as long as you are in good health. Consult with your health care provider.  Pap test.** / Every 3 years starting at age 30 years through age 65 or 70 years with a history of 3 consecutive normal Pap tests.  HPV screening.** / Every 3 years from ages 30 years through ages 65 to 70 years with a history of 3 consecutive normal Pap tests.  Fecal occult blood test (FOBT) of stool. / Every year beginning at age 50 years and continuing until age 75 years. You may not need to do this test if you get a colonoscopy every 10 years.  Flexible sigmoidoscopy or colonoscopy.** / Every 5 years for a flexible sigmoidoscopy or every 10 years for a colonoscopy beginning at age 50 years and continuing until age 75 years.  Hepatitis C blood test.** / For all people born from 1945 through  1965 and any individual with known risks for hepatitis C.  Skin self-exam. / Monthly.  Influenza vaccine. / Every year.  Tetanus, diphtheria, and acellular pertussis (Tdap/Td) vaccine.** / Consult your health care provider. Pregnant women should receive 1 dose of Tdap vaccine during each pregnancy. 1 dose of Td every 10 years.  Varicella vaccine.** / Consult your health care provider. Pregnant females who do not have evidence of immunity should receive the first dose after pregnancy.  Zoster vaccine.** / 1 dose for adults aged 60 years or older.  Measles, mumps, rubella (MMR) vaccine.** / You need at least 1 dose of MMR if you were born in 1957 or later. You may also need a 2nd dose. For females of childbearing age, rubella immunity should be determined. If there is no evidence of immunity, females who are not pregnant should be vaccinated. If there is no evidence of immunity, females who are pregnant should delay immunization until after pregnancy.  Pneumococcal 13-valent conjugate (PCV13) vaccine.** / Consult your health care provider.  Pneumococcal polysaccharide (PPSV23) vaccine.** / 1 to 2 doses if you smoke cigarettes or if you have certain conditions.  Meningococcal vaccine.** / Consult your health care provider.  Hepatitis A vaccine.** / Consult your health care provider.  Hepatitis B vaccine.** / Consult your health care provider.  Haemophilus influenzae type b (Hib) vaccine.** / Consult your health care provider. Ages 65   years and over  Blood pressure check.** / Every 1 to 2 years.  Lipid and cholesterol check.** / Every 5 years beginning at age 22 years.  Lung cancer screening. / Every year if you are aged 73-80 years and have a 30-pack-year history of smoking and currently smoke or have quit within the past 15 years. Yearly screening is stopped once you have quit smoking for at least 15 years or develop a health problem that would prevent you from having lung cancer  treatment.  Clinical breast exam.** / Every year after age 4 years.  BRCA-related cancer risk assessment.** / For women who have family members with a BRCA-related cancer (breast, ovarian, tubal, or peritoneal cancers).  Mammogram.** / Every year beginning at age 40 years and continuing for as long as you are in good health. Consult with your health care provider.  Pap test.** / Every 3 years starting at age 9 years through age 34 or 91 years with 3 consecutive normal Pap tests. Testing can be stopped between 65 and 70 years with 3 consecutive normal Pap tests and no abnormal Pap or HPV tests in the past 10 years.  HPV screening.** / Every 3 years from ages 57 years through ages 64 or 45 years with a history of 3 consecutive normal Pap tests. Testing can be stopped between 65 and 70 years with 3 consecutive normal Pap tests and no abnormal Pap or HPV tests in the past 10 years.  Fecal occult blood test (FOBT) of stool. / Every year beginning at age 15 years and continuing until age 17 years. You may not need to do this test if you get a colonoscopy every 10 years.  Flexible sigmoidoscopy or colonoscopy.** / Every 5 years for a flexible sigmoidoscopy or every 10 years for a colonoscopy beginning at age 86 years and continuing until age 71 years.  Hepatitis C blood test.** / For all people born from [birth year] through 1963 and any individual with known risks for hepatitis C.  Osteoporosis screening.** / A one-time screening for women ages 83 years and over and women at risk for fractures or osteoporosis.  Skin self-exam. / Monthly.  Influenza vaccine. / Every year.  Tetanus, diphtheria, and acellular pertussis (Tdap/Td) vaccine.** / 1 dose of Td every 10 years.  Varicella vaccine.** / Consult your health care provider.  Zoster vaccine.** / 1 dose for adults aged 61 years or older.  Pneumococcal 13-valent conjugate (PCV13) vaccine.** / Consult your health care provider.  Pneumococcal  polysaccharide (PPSV23) vaccine.** / 1 dose for all adults aged 28 years and older.  Meningococcal vaccine.** / Consult your health care provider.  Hepatitis A vaccine.** / Consult your health care provider.  Hepatitis B vaccine.** / Consult your health care provider.  Haemophilus influenzae type b (Hib) vaccine.** / Consult your health care provider. ** Family history and personal history of risk and conditions may change your health care provider's recommendations. Document Released: 10/05/2001 Document Revised: 12/24/2013 Document Reviewed: 01/04/2011 [facility] Patient Information 2015 [place], [place]. This information is not intended to replace advice given to you by your health care provider. Make sure you discuss any questions you have with your health care provider.

## 2014-05-29 NOTE — Assessment & Plan Note (Signed)
The patient is here for annual Medicare wellness examination and management of other chronic and acute problems.   The risk factors are reflected in the social history.  The roster of all physicians providing medical care to patient - is listed in the Snapshot section of the chart.  Activities of daily living:  The patient is 100% inedpendent in all ADLs: dressing, toileting, feeding as well as independent mobility  Home safety : The patient has smoke detectors in the home. They wear seatbelts. There is no violence in the home.   There is no risks for hepatitis, STDs or HIV. There is no   history of blood transfusion. They have no travel history to infectious disease endemic areas of the world.  The patient has  seen their dentist in the last 12 month. They have  seen their eye doctor in the last year. They deny  Any major hearing difficulty and have not had audiologic testing in the last year.  They do not  have excessive sun exposure. Discussed the need for sun protection: hats, long sleeves and use of sunscreen if there is significant sun exposure.   Diet: the importance of a healthy diet is discussed. They do have a reasonably healthy  diet.  The patient has a fairly regular exercise program of a mixed nature: walking, yard work, etc.The benefits of regular aerobic exercise were discussed - in [employer] program.  Depression screen: there are no signs or vegative symptoms of depression- irritability, change in appetite, anhedonia, sadness/tearfullness.  Cognitive assessment: the patient manages all their financial and personal affairs and is actively engaged. They could relate day,date,year and events; recalled 3/3 objects at 3 minutes  The following portions of the patient's history were reviewed and updated as appropriate: allergies, current medications, past family history, past medical history,  past surgical history, past social history  and problem list.  Vision, hearing, body  mass index were assessed and reviewed.   During the course of the visit the patient was educated and counseled about appropriate screening and preventive services including : fall prevention , diabetes screening, nutrition counseling, colorectal cancer screening, and recommended immunizations.  Flu shot

## 2014-05-31 ENCOUNTER — Other Ambulatory Visit: Payer: Self-pay | Admitting: Internal Medicine

## 2014-08-31 ENCOUNTER — Other Ambulatory Visit: Payer: Self-pay | Admitting: Internal Medicine

## 2014-09-01 ENCOUNTER — Other Ambulatory Visit: Payer: Self-pay | Admitting: Internal Medicine

## 2014-09-05 ENCOUNTER — Emergency Department (HOSPITAL_COMMUNITY)
Admission: EM | Admit: 2014-09-05 | Discharge: 2014-09-05 | Disposition: A | Discharge status: Home / Self Care | Payer: Medicare Other | Attending: Emergency Medicine | Admitting: Emergency Medicine

## 2014-09-05 ENCOUNTER — Encounter (HOSPITAL_COMMUNITY): Payer: Self-pay | Admitting: Emergency Medicine

## 2014-09-05 DIAGNOSIS — I10 Essential (primary) hypertension: Secondary | ICD-10-CM | POA: Diagnosis not present

## 2014-09-05 DIAGNOSIS — Z86010 Personal history of colonic polyps: Secondary | ICD-10-CM | POA: Diagnosis not present

## 2014-09-05 DIAGNOSIS — K219 Gastro-esophageal reflux disease without esophagitis: Secondary | ICD-10-CM | POA: Diagnosis not present

## 2014-09-05 DIAGNOSIS — E785 Hyperlipidemia, unspecified: Secondary | ICD-10-CM | POA: Insufficient documentation

## 2014-09-05 DIAGNOSIS — Y998 Other external cause status: Secondary | ICD-10-CM | POA: Insufficient documentation

## 2014-09-05 DIAGNOSIS — Y9289 Other specified places as the place of occurrence of the external cause: Secondary | ICD-10-CM | POA: Diagnosis not present

## 2014-09-05 DIAGNOSIS — Q613 Polycystic kidney, unspecified: Secondary | ICD-10-CM | POA: Insufficient documentation

## 2014-09-05 DIAGNOSIS — E538 Deficiency of other specified B group vitamins: Secondary | ICD-10-CM | POA: Diagnosis not present

## 2014-09-05 DIAGNOSIS — S66912A Strain of unspecified muscle, fascia and tendon at wrist and hand level, left hand, initial encounter: Secondary | ICD-10-CM | POA: Diagnosis not present

## 2014-09-05 DIAGNOSIS — Z79899 Other long term (current) drug therapy: Secondary | ICD-10-CM | POA: Diagnosis not present

## 2014-09-05 DIAGNOSIS — E559 Vitamin D deficiency, unspecified: Secondary | ICD-10-CM | POA: Diagnosis not present

## 2014-09-05 DIAGNOSIS — M79602 Pain in left arm: Secondary | ICD-10-CM | POA: Diagnosis present

## 2014-09-05 DIAGNOSIS — Z8619 Personal history of other infectious and parasitic diseases: Secondary | ICD-10-CM | POA: Diagnosis not present

## 2014-09-05 DIAGNOSIS — D179 Benign lipomatous neoplasm, unspecified: Secondary | ICD-10-CM | POA: Diagnosis not present

## 2014-09-05 DIAGNOSIS — Z87442 Personal history of urinary calculi: Secondary | ICD-10-CM | POA: Diagnosis not present

## 2014-09-05 DIAGNOSIS — Z880 Allergy status to penicillin: Secondary | ICD-10-CM | POA: Insufficient documentation

## 2014-09-05 DIAGNOSIS — Z872 Personal history of diseases of the skin and subcutaneous tissue: Secondary | ICD-10-CM | POA: Insufficient documentation

## 2014-09-05 DIAGNOSIS — X58XXXA Exposure to other specified factors, initial encounter: Secondary | ICD-10-CM | POA: Insufficient documentation

## 2014-09-05 DIAGNOSIS — Y9389 Activity, other specified: Secondary | ICD-10-CM | POA: Insufficient documentation

## 2014-09-05 DIAGNOSIS — S46812A Strain of other muscles, fascia and tendons at shoulder and upper arm level, left arm, initial encounter: Secondary | ICD-10-CM | POA: Diagnosis not present

## 2014-09-05 DIAGNOSIS — T148XXA Other injury of unspecified body region, initial encounter: Secondary | ICD-10-CM

## 2014-09-05 DIAGNOSIS — Z7952 Long term (current) use of systemic steroids: Secondary | ICD-10-CM | POA: Insufficient documentation

## 2014-09-05 MED ORDER — IBUPROFEN 400 MG PO TABS: 400.0000 mg | ORAL_TABLET | Freq: Three times a day (TID) | ORAL | Status: DC | PRN

## 2014-09-05 MED ORDER — CYCLOBENZAPRINE HCL 10 MG PO TABS: 5.0000 mg | ORAL_TABLET | Freq: Every day | ORAL | Status: DC

## 2014-09-05 NOTE — ED Notes (Signed)
Pt here with swollen area to left arm where two knots had been for 20 years that became swollen and painful today; CMS intact

## 2014-09-05 NOTE — Discharge Instructions (Signed)
Please call your doctor for a followup appointment within 24-48 hours. When you talk to your doctor please let them know that you were seen in the emergency department and have them acquire all of your records so that they can discuss the findings with you and formulate a treatment plan to fully care for your new and ongoing problems. Please follow-up with your primary care provider Please follow-up with orthopedics Please rest and stay hydrated Please avoid any physical strenuous activity Please massage with icy hot ointment Please take medications as prescribed on a full stomach While taking muscle relaxers is to be no drinking alcohol, driving, operating any heavy machinery. Muscle relaxers can cause drowsiness so please be aware. Please continue to monitor symptoms closely and if symptoms are to worsen or change (fever greater than 101, chills, sweating, nausea, vomiting, chest pain, shortness of breathe, difficulty breathing, weakness, numbness, tingling, worsening or changes to pain pattern, swelling, redness, decreased range of motion, fall, injury, red streaks running down the arm) please report back to the Emergency Department immediately.    Lipoma A lipoma is a noncancerous (benign) tumor composed of fat cells. They are usually found under the skin (subcutaneous). A lipoma may occur in any tissue of the body that contains fat. Common areas for lipomas to appear include the back, shoulders, buttocks, and thighs. Lipomas are a very common soft tissue growth. They are soft and grow slowly. Most problems caused by a lipoma depend on where it is growing. DIAGNOSIS  A lipoma can be diagnosed with a physical exam. These tumors rarely become cancerous, but radiographic studies can help determine this for certain. Studies used may include:  Computerized X-ray scans (CT or CAT scan).  Computerized magnetic scans (MRI). TREATMENT  Small lipomas that are not causing problems may be watched. If a  lipoma continues to enlarge or causes problems, removal is often the best treatment. Lipomas can also be removed to improve appearance. Surgery is done to remove the fatty cells and the surrounding capsule. Most often, this is done with medicine that numbs the area (local anesthetic). The removed tissue is examined under a microscope to make sure it is not cancerous. Keep all follow-up appointments with your caregiver. SEEK MEDICAL CARE IF:   The lipoma becomes larger or hard.  The lipoma becomes painful, red, or increasingly swollen. These could be signs of infection or a more serious condition. Document Released: 07/30/2002 Document Revised: 11/01/2011 Document Reviewed: 01/09/2010 [facility] Patient Information 2015 [place], [place]. This information is not intended to replace advice given to you by your health care provider. Make sure you discuss any questions you have with your health care provider.  Muscle Strain A muscle strain is an injury that occurs when a muscle is stretched beyond its normal length. Usually a small number of muscle fibers are torn when this happens. Muscle strain is rated in degrees. First-degree strains have the least amount of muscle fiber tearing and pain. Second-degree and third-degree strains have increasingly more tearing and pain.  Usually, recovery from muscle strain takes 1-2 weeks. Complete healing takes 5-6 weeks.  CAUSES  Muscle strain happens when a sudden, violent force placed on a muscle stretches it too far. This may occur with lifting, sports, or a fall.  RISK FACTORS Muscle strain is especially common in athletes.  SIGNS AND SYMPTOMS At the site of the muscle strain, there may be:  Pain.  Bruising.  Swelling.  Difficulty using the muscle due to pain or lack of  normal function. DIAGNOSIS  Your health care provider will perform a physical exam and ask about your medical history. TREATMENT  Often, the best treatment for a muscle strain is resting,  icing, and applying cold compresses to the injured area.  HOME CARE INSTRUCTIONS   Use the PRICE method of treatment to promote muscle healing during the first 2-3 days after your injury. The PRICE method involves:  Protecting the muscle from being injured again.  Restricting your activity and resting the injured body part.  Icing your injury. To do this, put ice in a plastic bag. Place a towel between your skin and the bag. Then, apply the ice and leave it on from 15-20 minutes each hour. After the third day, switch to moist heat packs.  Apply compression to the injured area with a splint or elastic bandage. Be careful not to wrap it too tightly. This may interfere with blood circulation or increase swelling.  Elevate the injured body part above the level of your heart as often as you can.  Only take over-the-counter or prescription medicines for pain, discomfort, or fever as directed by your health care provider.  Warming up prior to exercise helps to prevent future muscle strains. SEEK MEDICAL CARE IF:   You have increasing pain or swelling in the injured area.  You have numbness, tingling, or a significant loss of strength in the injured area. MAKE SURE YOU:   Understand these instructions.  Will watch your condition.  Will get help right away if you are not doing well or get worse. Document Released: 08/09/2005 Document Revised: 05/30/2013 Document Reviewed: 03/08/2013 [facility] Patient Information 2015 [place], [place]. This information is not intended to replace advice given to you by your health care provider. Make sure you discuss any questions you have with your health care provider.

## 2014-09-05 NOTE — ED Provider Notes (Signed)
81 year old female presents with left lateral elbow pain which started this morning when she woke up. She is never this pain before though she does have a history of a mass in the antecubital fossa on the medial surface. This has been present for 20 years however the pain has only been present today. The pain is not associated with redness swelling and when I put her arm through range of motion at the elbow there is absolutely no pain with passive range of motion either with rotation, pronation, separation, flexion or extension. There is no tenderness over the joint capsule however there is tenderness over the lateral epicondyle and soft tissues and there is reproducible pain with forced pronation of the left forearm. I suspect this is related to musculoskeletal inflammation in that area. No signs of infection, no signs of joint arthropathy or septic arthritis, the patient can be discharged with anti-inflammatories, muscle relaxant, follow-up with orthopedics as outpatient. I explained the plan, the patient is amenable.  Medical screening examination/treatment/procedure(s) were conducted as a shared visit with non-physician practitioner(s) and myself.  I personally evaluated the patient during the encounter.  Clinical Impression:   Final diagnoses:  Lipoma  Muscle strain         Johnna Acosta, MD 09/06/14 1616

## 2014-09-05 NOTE — ED Provider Notes (Signed)
CSN: [id number]     Arrival date & time 09/05/14  1624 History  This chart was scribed for non-physician practitioner, Jamse Mead, PA-C working with Johnna Acosta, MD by Tula Nakayama, ED scribe. This patient was seen in room TR10C/TR10C and the patient's care was started at 5:10 PM   Chief Complaint  Patient presents with  . Arm Pain   The history is provided by the patient. No language interpreter was used.    HPI Comments: Jasmin [name] is a 81 y.o. female with a history of HTN and hyperlipidemia who presents to the Emergency Department complaining of acute onset throbbing pain over 2 lipoma on the medial aspect of her left arm. Pt states pain began when she woke up this morning, but the lipoma have been present for at least 20 years. She notes that the lipoma have moved higher on her arm since their onset, but denies changes in the size or the color of the lipoma or a history of similar pain. Pt reports symptoms become worse with extension of her elbow.  She denies lifting heavy objects, lifting over her head, changes in her routine, recent travel, falls and injuries to the arm. She denies fever, chills, SOB, CP, neck pain,  neck stiffness, numbness, tingling, decreased ROM and drainage as associated symptoms.  TCP Dr. Alain Marion  Past Medical History  Diagnosis Date  . Diverticulosis of colon   . Hyperlipidemia   . Hypertension   . GERD with stricture     Dr Henrene Pastor  . Vitamin B12 deficiency 2009    Borderline  . Hemorrhoids   . Hiatal hernia   . Polycystic kidney   . Esophageal stricture   . Tubulovillous adenoma polyp of colon   . Onychomycosis   . Personal history of urinary calculi   . Contact dermatitis and other eczema, due to unspecified cause   . Unspecified vitamin D deficiency    Past Surgical History  Procedure Laterality Date  . Tonsillectomy      as child  . Dilation and curettage of uterus  1977  . Toe surgery  2012    Right foot    Family History   Problem Relation Age of Onset  . Colon cancer Neg Hx   . Hypertension Father    History  Substance Use Topics  . Smoking status: Never Smoker   . Smokeless tobacco: Never Used  . Alcohol Use: No   OB History    No data available     Review of Systems  Constitutional: Negative for fever and chills.  Musculoskeletal: Positive for arthralgias (left elbow pain ). Negative for neck pain and neck stiffness.  Skin: Positive for wound. Negative for color change.       2 lipoma on the medial aspect of her left arm  Neurological: Negative for weakness and numbness.      Allergies  Clarithromycin; Penicillins; and Sulfonamide derivatives  Home Medications   Prior to Admission medications   Medication Sig Start Date End Date Taking? Authorizing Provider  capsaicin (ZOSTRIX) 0.025 % cream Apply topically 2 (two) times daily. On R foot 05/22/13   Lew Dawes V, MD  Cholecalciferol 1000 UNITS tablet Take 1,000 Units by mouth daily.      Historical Provider, MD  cyclobenzaprine (FLEXERIL) 10 MG tablet Take 0.5 tablets (5 mg total) by mouth daily. 09/05/14   Samuele Storey, PA-C  hydrocortisone (ANUSOL-HC) 25 MG suppository Place 25 mg rectally 2 (two) times daily.  Historical Provider, MD  ibuprofen (ADVIL,MOTRIN) 400 MG tablet Take 1 tablet (400 mg total) by mouth every 8 (eight) hours as needed. 09/05/14   Sanjuana Mruk, PA-C  lovastatin (MEVACOR) 40 MG tablet TAKE 1 TABLET AT BEDTIME 09/02/14   Aleksei Plotnikov V, MD  metoprolol (LOPRESSOR) 50 MG tablet TAKE 1 TABLET BY MOUTH TWICE A DAY 05/31/14   Aleksei Plotnikov V, MD  mupirocin ointment (BACTROBAN) 2 % Use bid on the wound 03/02/13   Aleksei Plotnikov V, MD  omeprazole (PRILOSEC) 20 MG capsule TAKE 1 CAPSULE (20 MG TOTAL) BY MOUTH DAILY. 09/02/14   Aleksei Plotnikov V, MD  polyethylene glycol (GLYCOLAX) powder Take 17 g by mouth daily as needed (constipation).     Historical Provider, MD  triamcinolone ointment (KENALOG) 0.5 %  Apply topically 2 (two) times daily. 03/27/13   Aleksei Plotnikov V, MD  triamterene-hydrochlorothiazide (MAXZIDE-25) 37.5-25 MG per tablet TAKE 1 EACH (1 TABLET TOTAL) BY MOUTH DAILY. 09/02/14   Aleksei Plotnikov V, MD  vitamin B-12 (CYANOCOBALAMIN) 1000 MCG tablet Take 500 mcg by mouth daily.      Historical Provider, MD   BP 153/56 mmHg  Pulse 83  Temp(Src) 98.1 F (36.7 C) (Oral)  Resp 18  SpO2 95% Physical Exam  Constitutional: She is oriented to person, place, and time. She appears well-developed and well-nourished. No distress.  HENT:  Head: Normocephalic and atraumatic.  Eyes: Conjunctivae and EOM are normal. Right eye exhibits no discharge. Left eye exhibits no discharge.  Neck: Normal range of motion. Neck supple.  Cardiovascular: Normal rate, regular rhythm and normal heart sounds.  Exam reveals no friction rub.   No murmur heard. Pulmonary/Chest: Effort normal and breath sounds normal. No respiratory distress. She has no wheezes. She has no rales.  Musculoskeletal: Normal range of motion. She exhibits tenderness.  Approximately 2 cm x 2 cm soft, mobile, nontender raised lesion identified to the medial aspects of the extensor surface of the left elbow-lateral epicondyles. Negative pain upon palpation. Negative erythema, warmth, tenderness upon palpation. Negative active drainage or bleeding noted. Appears to be a lipoma.  Tenderness upon palpation to the lateral aspect of the left elbow with negative erythema, inflammation, lesions, sores, swelling. Negative rashes noted. Negative red streaks. Negative findings of cellulitic infection. Full flexion extension of the left elbow identified without difficulty-mild discomfort with full extension of the left elbow. Full range of motion to the left shoulder, left wrist and digits of the left hand without difficulty. Proper supination and pronation noted. Negative snuffbox tenderness.  Neurological: She is alert and oriented to person, place,  and time. No cranial nerve deficit. She exhibits normal muscle tone. Coordination normal.  Cranial nerves III-XII grossly intact Strength 5+/5+ to upper extremities bilaterally with resistance applied, equal distribution noted Equal grip strength bilaterally Strength intact to MCP, PIP, DIP joints of left hand Sensation intact Fine motor skills intact  Skin: Skin is warm and dry. No rash noted. She is not diaphoretic. No erythema.  Psychiatric: She has a normal mood and affect. Her behavior is normal. Thought content normal.  Nursing note and vitals reviewed.   ED Course  Procedures (including critical care time) DIAGNOSTIC STUDIES: Oxygen Saturation is 95% on RA, normal by my interpretation.    COORDINATION OF CARE: 5:28 PM Discussed treatment plan with pt at bedside and pt agreed to plan.   Labs Review Labs Reviewed - No data to display  Imaging Review No results found.   EKG Interpretation None  5:34 PM Patient seen and assessed by attending physician, Dr. Noemi Chapel. As per attending physician, recommended patient be discharged home with NSAIDs and muscle relaxers. Recommended referral to orthopedics.  MDM   Final diagnoses:  Lipoma  Muscle strain    Medications - No data to display  Filed Vitals:   09/05/14 1646  BP: 153/56  Pulse: 83  Temp: 98.1 F (36.7 C)  TempSrc: Oral  Resp: 18  SpO2: 95%   I personally performed the services described in this documentation, which was scribed in my presence. The recorded information has been reviewed and is accurate.  Patient presenting to the emergency department with left elbow pain that started this morning. Patient reported that she has a lipoma identified to the medial aspect of the left elbow, extensor surface that is been ongoing for approximately 20 years-denied any changes to skin colored. Reported that this morning she had pain localized to the lateral aspect of her left elbow described as a soreness  sensation. Denied recent falls or trauma. Full range of motion identified to left upper extremity-full extension, flexion, pronation and supination. Discomfort with extension of the left arm. Full range of motion to the left wrist and digits of the left hand and left shoulder. Sensation intact. Pulses palpable and strong. Cap refill less than 3 seconds. Negative findings of cellulitic infection. Negative focal neurological deficits. Negative signs of ischemia. Patient seen and assessed by attending physician, Dr. Noemi Chapel, as per physician recommended patient be discharged with NSAIDs and muscle relaxer. Patient stable, afebrile. Patient not septic appearing. Discharged patient. Doubt septic joint. Suspicion to be muscular discomfort, myalgia secondary to pain upon palpation of motion. Discussed with patient to rest and stay hydrated. Discussed with patient to massage. Referred patient to PCP and orthopedics. Discussed with patient to closely monitor symptoms and if symptoms are to worsen or change to report back to the ED - strict return instructions given.  Patient agreed to plan of care, understood, all questions answered.   Jamse Mead, PA-C 09/05/14 1753  Johnna Acosta, MD 09/06/14 [phone]

## 2014-09-30 ENCOUNTER — Encounter: Payer: Self-pay | Admitting: Internal Medicine

## 2014-09-30 ENCOUNTER — Ambulatory Visit (INDEPENDENT_AMBULATORY_CARE_PROVIDER_SITE_OTHER): Payer: Medicare Other | Admitting: Internal Medicine

## 2014-09-30 VITALS — BP 160/90 | HR 75 | Temp 98.5°F | Wt 222.0 lb

## 2014-09-30 DIAGNOSIS — E559 Vitamin D deficiency, unspecified: Secondary | ICD-10-CM

## 2014-09-30 DIAGNOSIS — D1720 Benign lipomatous neoplasm of skin and subcutaneous tissue of unspecified limb: Secondary | ICD-10-CM | POA: Insufficient documentation

## 2014-09-30 DIAGNOSIS — E785 Hyperlipidemia, unspecified: Secondary | ICD-10-CM

## 2014-09-30 DIAGNOSIS — I10 Essential (primary) hypertension: Secondary | ICD-10-CM

## 2014-09-30 DIAGNOSIS — E538 Deficiency of other specified B group vitamins: Secondary | ICD-10-CM | POA: Diagnosis not present

## 2014-09-30 NOTE — Assessment & Plan Note (Signed)
Continue with current prescription therapy as reflected on the Med list.  

## 2014-09-30 NOTE — Assessment & Plan Note (Signed)
L anterior dital arm egg size lipoma, NT (x20 years - no change) Will watch. Surg ref offered

## 2014-09-30 NOTE — Progress Notes (Signed)
Pre visit review using our clinic review tool, if applicable. No additional management support is needed unless otherwise documented below in the visit note. 

## 2014-09-30 NOTE — Progress Notes (Signed)
Subjective:    HPI  F/u ER visit on 09/05/14 (pain resolved): "81 year old female presents with left lateral elbow pain which started this morning when she woke up. She is never this pain before though she does have a history of a mass in the antecubital fossa on the medial surface. This has been present for 20 years however the pain has only been present today. The pain is not associated with redness swelling and when I put her arm through range of motion at the elbow there is absolutely no pain with passive range of motion either with rotation, pronation, separation, flexion or extension. There is no tenderness over the joint capsule however there is tenderness over the lateral epicondyle and soft tissues and there is reproducible pain with forced pronation of the left forearm. I suspect this is related to musculoskeletal inflammation in that area. No signs of infection, no signs of joint arthropathy or septic arthritis, the patient can be discharged with anti-inflammatories, muscle relaxant, follow-up with orthopedics as outpatient. I explained the plan, the patient is amenable."  F/u L schiatica pain - resolved.   The patient needs to address  chronic hypertension that has been well controlled with medicines; to address chronic  hyperlipidemia controlled with medicines as well; and to address GERD, B12 def. She is on Prilosec No memory issues lately  Wt Readings from Last 3 Encounters:  09/30/14 222 lb (100.699 kg)  05/29/14 224 lb (101.606 kg)  01/24/14 222 lb (100.699 kg)   BP Readings from Last 3 Encounters:  09/30/14 160/90  09/05/14 153/56  05/29/14 130/74       Review of Systems  Constitutional: Negative for chills, activity change, appetite change, fatigue and unexpected weight change.  HENT: Negative for congestion, mouth sores and sinus pressure.   Eyes: Negative for visual disturbance.  Respiratory: Negative for cough and chest tightness.   Gastrointestinal: Positive  for anal bleeding. Negative for nausea and abdominal pain.  Genitourinary: Negative for frequency, difficulty urinating and vaginal pain.  Musculoskeletal: Positive for back pain. Negative for gait problem.  Skin: Negative for pallor and rash.  Neurological: Negative for dizziness, tremors, weakness, numbness and headaches.  Psychiatric/Behavioral: Negative for confusion and sleep disturbance.       Objective:   Physical Exam  Constitutional: She appears well-developed. No distress.  obese  HENT:  Head: Normocephalic.  Right Ear: External ear normal.  Left Ear: External ear normal.  Nose: Nose normal.  Mouth/Throat: Oropharynx is clear and moist.  Eyes: Conjunctivae are normal. Pupils are equal, round, and reactive to light. Right eye exhibits no discharge. Left eye exhibits no discharge.  Neck: Normal range of motion. Neck supple. No JVD present. No tracheal deviation present. No thyromegaly present.  Cardiovascular: Normal rate, regular rhythm and normal heart sounds.   Pulmonary/Chest: No stridor. No respiratory distress. She has no wheezes.  Abdominal: Soft. Bowel sounds are normal. She exhibits no distension and no mass. There is no tenderness. There is no rebound and no guarding.  Musculoskeletal: She exhibits tenderness. She exhibits no edema.  LS is tender on L   Lymphadenopathy:    She has no cervical adenopathy.  Neurological: She displays normal reflexes. No cranial nerve deficit. She exhibits normal muscle tone. Coordination normal.  Skin: No rash noted. No erythema.  Psychiatric: She has a normal mood and affect. Her behavior is normal. Judgment and thought content normal.  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

## 2014-09-30 NOTE — Assessment & Plan Note (Signed)
BP OK at home Continue with current prescription therapy as reflected on the Med list.

## 2014-09-30 NOTE — Patient Instructions (Signed)
Check BP at home weekly

## 2014-09-30 NOTE — Assessment & Plan Note (Signed)
Continue with current Vit D therapy as reflected on the Med list.

## 2014-10-17 ENCOUNTER — Encounter: Payer: Self-pay | Admitting: Internal Medicine

## 2014-11-30 ENCOUNTER — Other Ambulatory Visit: Payer: Self-pay | Admitting: Internal Medicine

## 2014-12-11 DIAGNOSIS — H2513 Age-related nuclear cataract, bilateral: Secondary | ICD-10-CM | POA: Diagnosis not present

## 2015-01-29 ENCOUNTER — Ambulatory Visit (INDEPENDENT_AMBULATORY_CARE_PROVIDER_SITE_OTHER): Payer: Medicare Other | Admitting: Internal Medicine

## 2015-01-29 ENCOUNTER — Encounter: Payer: Self-pay | Admitting: Internal Medicine

## 2015-01-29 VITALS — BP 128/78 | HR 65 | Wt 220.0 lb

## 2015-01-29 DIAGNOSIS — E538 Deficiency of other specified B group vitamins: Secondary | ICD-10-CM

## 2015-01-29 DIAGNOSIS — E559 Vitamin D deficiency, unspecified: Secondary | ICD-10-CM

## 2015-01-29 DIAGNOSIS — I10 Essential (primary) hypertension: Secondary | ICD-10-CM | POA: Diagnosis not present

## 2015-01-29 DIAGNOSIS — E785 Hyperlipidemia, unspecified: Secondary | ICD-10-CM

## 2015-01-29 DIAGNOSIS — K219 Gastro-esophageal reflux disease without esophagitis: Secondary | ICD-10-CM

## 2015-01-29 NOTE — Assessment & Plan Note (Signed)
Chronic On lovastatin

## 2015-01-29 NOTE — Progress Notes (Signed)
Pre visit review using our clinic review tool, if applicable. No additional management support is needed unless otherwise documented below in the visit note. 

## 2015-01-29 NOTE — Assessment & Plan Note (Signed)
On Vit D 

## 2015-01-29 NOTE — Assessment & Plan Note (Signed)
Chronic; on Prilosec 20 mg doing well

## 2015-01-29 NOTE — Assessment & Plan Note (Signed)
On B12 

## 2015-01-29 NOTE — Progress Notes (Signed)
   Subjective:    HPI  The patient needs to address  chronic hypertension that has been well controlled with medicines; to address chronic  hyperlipidemia controlled with medicines as well; and to address GERD, B12 def. She is on Prilosec No memory issues lately. [facility]  Wt Readings from Last 3 Encounters:  01/29/15 220 lb (99.791 kg)  09/30/14 222 lb (100.699 kg)  05/29/14 224 lb (101.606 kg)   BP Readings from Last 3 Encounters:  01/29/15 128/78  09/30/14 160/90  09/05/14 153/56       Review of Systems  Constitutional: Negative for chills, activity change, appetite change, fatigue and unexpected weight change.  HENT: Negative for congestion, mouth sores and sinus pressure.   Eyes: Negative for visual disturbance.  Respiratory: Negative for cough and chest tightness.   Gastrointestinal: Negative for nausea, abdominal pain and anal bleeding.  Genitourinary: Negative for frequency, difficulty urinating and vaginal pain.  Musculoskeletal: Negative for back pain and gait problem.  Skin: Negative for pallor and rash.  Neurological: Negative for dizziness, tremors, weakness, numbness and headaches.  Psychiatric/Behavioral: Negative for confusion and sleep disturbance.       Objective:   Physical Exam  Constitutional: She appears well-developed. No distress.  NAD obese  HENT:  Head: Normocephalic.  Right Ear: External ear normal.  Left Ear: External ear normal.  Nose: Nose normal.  Mouth/Throat: Oropharynx is clear and moist.  Eyes: Conjunctivae are normal. Pupils are equal, round, and reactive to light. Right eye exhibits no discharge. Left eye exhibits no discharge.  Neck: Normal range of motion. Neck supple. No JVD present. No tracheal deviation present. No thyromegaly present.  Cardiovascular: Normal rate, regular rhythm and normal heart sounds.   Pulmonary/Chest: No stridor. No respiratory distress. She has no wheezes.  Abdominal: Soft. Bowel  sounds are normal. She exhibits no distension and no mass. There is no tenderness. There is no rebound and no guarding.  Musculoskeletal: She exhibits no edema or tenderness.  LS is not tender w/ROM  Lymphadenopathy:    She has no cervical adenopathy.  Neurological: She displays normal reflexes. No cranial nerve deficit. She exhibits normal muscle tone. Coordination normal.  Skin: No rash noted. No erythema.  Psychiatric: She has a normal mood and affect. Her behavior is normal. Judgment and thought content normal.    Lab Results  Component Value Date   WBC 3.0* 05/29/2014   HGB 13.4 05/29/2014   HCT 40.0 05/29/2014   PLT 120.0* 05/29/2014   GLUCOSE 91 05/29/2014   CHOL 176 05/29/2014   TRIG 136.0 05/29/2014   HDL 45.20 05/29/2014   LDLDIRECT 139.3 05/31/2012   LDLCALC 104* 05/29/2014   ALT 22 05/29/2014   AST 24 05/29/2014   NA 140 05/29/2014   K 3.8 05/29/2014   CL 104 05/29/2014   CREATININE 1.1 05/29/2014   BUN 22 05/29/2014   CO2 26 05/29/2014   TSH 1.66 05/29/2014   INR 1.16 08/16/2009         Assessment & Plan:

## 2015-01-29 NOTE — Assessment & Plan Note (Signed)
Chronic Metoprolol, Maxzide

## 2015-02-18 ENCOUNTER — Encounter (HOSPITAL_COMMUNITY): Payer: Self-pay | Admitting: Emergency Medicine

## 2015-02-18 ENCOUNTER — Emergency Department (HOSPITAL_COMMUNITY)
Admission: EM | Admit: 2015-02-18 | Discharge: 2015-02-18 | Disposition: A | Discharge status: Home / Self Care | Payer: Medicare Other | Source: Home / Self Care | Attending: Family Medicine | Admitting: Family Medicine

## 2015-02-18 DIAGNOSIS — L089 Local infection of the skin and subcutaneous tissue, unspecified: Secondary | ICD-10-CM | POA: Diagnosis not present

## 2015-02-18 DIAGNOSIS — S00501A Unspecified superficial injury of lip, initial encounter: Secondary | ICD-10-CM

## 2015-02-18 MED ORDER — CLINDAMYCIN HCL 300 MG PO CAPS: 300.0000 mg | ORAL_CAPSULE | Freq: Three times a day (TID) | ORAL | Status: DC

## 2015-02-18 NOTE — ED Provider Notes (Signed)
Jasmin [name] is a 81 y.o. female who presents to Urgent Care today for right lower lip pain and swelling. Patient bit her lip 3 days ago. It became painful and swollen this morning. She's tried Tylenol which helps. No fevers chills nausea vomiting or diarrhea. No treatment tried yet. No discharge.   Past Medical History  Diagnosis Date  . Diverticulosis of colon   . Hyperlipidemia   . Hypertension   . GERD with stricture     Dr Henrene Pastor  . Vitamin B12 deficiency 2009    Borderline  . Hemorrhoids   . Hiatal hernia   . Polycystic kidney   . Esophageal stricture   . Tubulovillous adenoma polyp of colon   . Onychomycosis   . Personal history of urinary calculi   . Contact dermatitis and other eczema, due to unspecified cause   . Unspecified vitamin D deficiency    Past Surgical History  Procedure Laterality Date  . Tonsillectomy      as child  . Dilation and curettage of uterus  1977  . Toe surgery  2012    Right foot    History  Substance Use Topics  . Smoking status: Never Smoker   . Smokeless tobacco: Never Used  . Alcohol Use: No   ROS as above Medications: No current facility-administered medications for this encounter.   Current Outpatient Prescriptions  Medication Sig Dispense Refill  . Cholecalciferol 1000 UNITS tablet Take 1,000 Units by mouth daily.      Marland Kitchen lovastatin (MEVACOR) 40 MG tablet TAKE 1 TABLET AT BEDTIME 90 tablet 3  . metoprolol (LOPRESSOR) 50 MG tablet TAKE 1 TABLET BY MOUTH TWICE A DAY 180 tablet 3  . omeprazole (PRILOSEC) 20 MG capsule TAKE 1 CAPSULE (20 MG TOTAL) BY MOUTH DAILY. 90 capsule 3  . triamterene-hydrochlorothiazide (MAXZIDE-25) 37.5-25 MG per tablet TAKE 1 EACH (1 TABLET TOTAL) BY MOUTH DAILY. 90 tablet 3  . capsaicin (ZOSTRIX) 0.025 % cream Apply topically 2 (two) times daily. On R foot 60 g 1  . clindamycin (CLEOCIN) 300 MG capsule Take 1 capsule (300 mg total) by mouth 3 (three) times daily. 21 capsule 0  . hydrocortisone  (ANUSOL-HC) 25 MG suppository Place 25 mg rectally 2 (two) times daily.    . mupirocin ointment (BACTROBAN) 2 % Use bid on the wound 30 g 0  . polyethylene glycol (GLYCOLAX) powder Take 17 g by mouth daily as needed (constipation).     . triamcinolone ointment (KENALOG) 0.5 % Apply topically 2 (two) times daily. 30 g 0  . vitamin B-12 (CYANOCOBALAMIN) 1000 MCG tablet Take 500 mcg by mouth daily.       Allergies  Allergen Reactions  . Clarithromycin     Per pt: unknown  . Penicillins     Per pt: unknown  . Sulfonamide Derivatives     Per pt unknown     Exam:  BP 149/82 mmHg  Pulse 74  Temp(Src) 98.3 F (36.8 C) (Oral)  Resp 12  SpO2 97% Gen: Well NAD HEENT: EOMI,  MMM right lower lip swollen and tender. No fluctuance or discharge. Exts: Brisk capillary refill, warm and well perfused.   No results found for this or any previous visit (from the past 24 hour(s)). No results found.  Assessment and Plan: 81 y.o. female with lip infection due to bite. Treat with clindamycin. Clindamycin picked  because patient is allergic to penicillin sulfa and clarithromycin.  Follow-up with PCP.  Discussed warning signs or symptoms.  Please see discharge instructions. Patient expresses understanding.     Gregor Hams, MD 02/18/15 1740

## 2015-02-18 NOTE — ED Notes (Signed)
Pt reports she bit her lower lip on Saturday; not getting any better Alert, no signs of acute distress.

## 2015-02-18 NOTE — Discharge Instructions (Signed)
Thank you for coming in today. Take clindamycin for infection.  Come back as needed.   Facial Infection You have an infection of your face. This requires special attention to help prevent serious problems. Infections in facial wounds can cause poor healing and scars. They can also spread to deeper tissues, especially around the eye. Wound and dental infections can lead to sinusitis, infection of the eye socket, and even meningitis. Permanent damage to the skin, eye, and nervous system may result if facial infections are not treated properly. With severe infections, hospital care for IV antibiotic injections may be needed if they don't respond to oral antibiotics. Antibiotics must be taken for the full course to insure the infection is eliminated. If the infection came from a bad tooth, it may have to be extracted when the infection is under control. Warm compresses may be applied to reduce skin irritation and remove drainage. You might need a tetanus shot now if:  You cannot remember when your last tetanus shot was.  You have never had a tetanus shot.  The object that caused your wound was dirty. If you need a tetanus shot, and you decide not to get one, there is a rare chance of getting tetanus. Sickness from tetanus can be serious. If you got a tetanus shot, your arm may swell, get red and warm to the touch at the shot site. This is common and not a problem. SEEK IMMEDIATE MEDICAL CARE IF:   You have increased swelling, redness, or trouble breathing.  You have a severe headache, dizziness, nausea, or vomiting.  You develop problems with your eyesight.  You have a fever. Document Released: 09/16/2004 Document Revised: 11/01/2011 Document Reviewed: 08/09/2005 [facility] Patient Information 2015 [place], [place]. This information is not intended to replace advice given to you by your health care provider. Make sure you discuss any questions you have with your health care provider.

## 2015-03-06 ENCOUNTER — Ambulatory Visit (INDEPENDENT_AMBULATORY_CARE_PROVIDER_SITE_OTHER): Payer: Medicare Other | Admitting: Internal Medicine

## 2015-03-06 ENCOUNTER — Encounter: Payer: Self-pay | Admitting: Internal Medicine

## 2015-03-06 VITALS — BP 140/76 | HR 63 | Temp 98.5°F | Wt 222.0 lb

## 2015-03-06 DIAGNOSIS — K122 Cellulitis and abscess of mouth: Secondary | ICD-10-CM

## 2015-03-06 MED ORDER — CLINDAMYCIN HCL 300 MG PO CAPS: 300.0000 mg | ORAL_CAPSULE | Freq: Three times a day (TID) | ORAL | Status: DC

## 2015-03-06 NOTE — Progress Notes (Signed)
Subjective:  Patient ID: Jasmin [name], female    DOB: 02-12-1934  Age: 81 y.o. MRN: [id number]  CC: No chief complaint on file.   HPI Jasmin [name] presents for 6/28 ER f/u "81 y.o. female with lip infection due to bite. Treat with clindamycin. Clindamycin picked because patient is allergic to penicillin sulfa and clarithromycin. Follow-up with PCP."  Outpatient Prescriptions Prior to Visit  Medication Sig Dispense Refill  . capsaicin (ZOSTRIX) 0.025 % cream Apply topically 2 (two) times daily. On R foot 60 g 1  . Cholecalciferol 1000 UNITS tablet Take 1,000 Units by mouth daily.      . hydrocortisone (ANUSOL-HC) 25 MG suppository Place 25 mg rectally 2 (two) times daily.    Marland Kitchen lovastatin (MEVACOR) 40 MG tablet TAKE 1 TABLET AT BEDTIME 90 tablet 3  . metoprolol (LOPRESSOR) 50 MG tablet TAKE 1 TABLET BY MOUTH TWICE A DAY 180 tablet 3  . mupirocin ointment (BACTROBAN) 2 % Use bid on the wound 30 g 0  . omeprazole (PRILOSEC) 20 MG capsule TAKE 1 CAPSULE (20 MG TOTAL) BY MOUTH DAILY. 90 capsule 3  . polyethylene glycol (GLYCOLAX) powder Take 17 g by mouth daily as needed (constipation).     . triamcinolone ointment (KENALOG) 0.5 % Apply topically 2 (two) times daily. 30 g 0  . triamterene-hydrochlorothiazide (MAXZIDE-25) 37.5-25 MG per tablet TAKE 1 EACH (1 TABLET TOTAL) BY MOUTH DAILY. 90 tablet 3  . vitamin B-12 (CYANOCOBALAMIN) 1000 MCG tablet Take 500 mcg by mouth daily.      . clindamycin (CLEOCIN) 300 MG capsule Take 1 capsule (300 mg total) by mouth 3 (three) times daily. (Patient not taking: Reported on 03/06/2015) 21 capsule 0   No facility-administered medications prior to visit.    ROS Review of Systems  Constitutional: Negative for fever and chills.  Psychiatric/Behavioral: The patient is not nervous/anxious.     Objective:  BP 140/76 mmHg  Pulse 63  Temp(Src) 98.5 F (36.9 C) (Oral)  Wt 222 lb (100.699 kg)  SpO2 97%  BP Readings from Last 3 Encounters:   03/06/15 140/76  02/18/15 149/82  01/29/15 128/78    Wt Readings from Last 3 Encounters:  03/06/15 222 lb (100.699 kg)  01/29/15 220 lb (99.791 kg)  09/30/14 222 lb (100.699 kg)    Physical Exam  Constitutional: No distress.  HENT:  Mouth/Throat: Oropharynx is clear and moist. No oropharyngeal exudate.  Pulmonary/Chest: No respiratory distress. She exhibits no tenderness.  Skin: She is not diaphoretic.   R inner cheek  9x6 mm oval swelling against the bite line   Lab Results  Component Value Date   WBC 3.0* 05/29/2014   HGB 13.4 05/29/2014   HCT 40.0 05/29/2014   PLT 120.0* 05/29/2014   GLUCOSE 91 05/29/2014   CHOL 176 05/29/2014   TRIG 136.0 05/29/2014   HDL 45.20 05/29/2014   LDLDIRECT 139.3 05/31/2012   LDLCALC 104* 05/29/2014   ALT 22 05/29/2014   AST 24 05/29/2014   NA 140 05/29/2014   K 3.8 05/29/2014   CL 104 05/29/2014   CREATININE 1.1 05/29/2014   BUN 22 05/29/2014   CO2 26 05/29/2014   TSH 1.66 05/29/2014   INR 1.16 08/16/2009    No results found.  Assessment & Plan:   There are no diagnoses linked to this encounter. I have discontinued Ms. Kinner's clindamycin. I am also having her maintain her polyethylene glycol powder, vitamin B-12, Cholecalciferol, hydrocortisone, mupirocin ointment, triamcinolone ointment, capsaicin, triamterene-hydrochlorothiazide, omeprazole,  lovastatin, and metoprolol.  No orders of the defined types were placed in this encounter.     Follow-up: No Follow-up on file.  Walker Kehr, MD

## 2015-03-06 NOTE — Progress Notes (Signed)
Pre visit review using our clinic review tool, if applicable. No additional management support is needed unless otherwise documented below in the visit note. 

## 2015-03-06 NOTE — Assessment & Plan Note (Signed)
6/16 w/residual swelling R inner cheek  9x6 mm oval swelling against the bite line Pt will see Dr Radford Pax - her [facility] if worse

## 2015-03-11 ENCOUNTER — Other Ambulatory Visit: Payer: Self-pay

## 2015-03-11 DIAGNOSIS — Z1231 Encounter for screening mammogram for malignant neoplasm of breast: Secondary | ICD-10-CM

## 2015-03-19 ENCOUNTER — Ambulatory Visit
Admission: RE | Admit: 2015-03-19 | Discharge: 2015-03-19 | Disposition: A | Discharge status: Home / Self Care | Payer: Medicare Other | Source: Ambulatory Visit

## 2015-03-19 DIAGNOSIS — Z1231 Encounter for screening mammogram for malignant neoplasm of breast: Secondary | ICD-10-CM | POA: Diagnosis not present

## 2015-06-02 ENCOUNTER — Other Ambulatory Visit (INDEPENDENT_AMBULATORY_CARE_PROVIDER_SITE_OTHER): Payer: Medicare Other

## 2015-06-02 DIAGNOSIS — E559 Vitamin D deficiency, unspecified: Secondary | ICD-10-CM | POA: Diagnosis not present

## 2015-06-02 DIAGNOSIS — I10 Essential (primary) hypertension: Secondary | ICD-10-CM | POA: Diagnosis not present

## 2015-06-02 DIAGNOSIS — E538 Deficiency of other specified B group vitamins: Secondary | ICD-10-CM

## 2015-06-02 DIAGNOSIS — K219 Gastro-esophageal reflux disease without esophagitis: Secondary | ICD-10-CM

## 2015-06-02 DIAGNOSIS — E785 Hyperlipidemia, unspecified: Secondary | ICD-10-CM

## 2015-06-02 LAB — CBC WITH DIFFERENTIAL/PLATELET
BASOS PCT: 0.3 % (ref 0.0–3.0)
Basophils Absolute: 0 10*3/uL (ref 0.0–0.1)
Eosinophils Absolute: 0 10*3/uL (ref 0.0–0.7)
Eosinophils Relative: 0.5 % (ref 0.0–5.0)
HCT: 41.2 % (ref 36.0–46.0)
Hemoglobin: 13.8 g/dL (ref 12.0–15.0)
Lymphocytes Relative: 50.6 % — ABNORMAL HIGH (ref 12.0–46.0)
Lymphs Abs: 2 10*3/uL (ref 0.7–4.0)
MCHC: 33.5 g/dL (ref 30.0–36.0)
MCV: 91.9 fl (ref 78.0–100.0)
MONO ABS: 0.4 10*3/uL (ref 0.1–1.0)
Monocytes Relative: 9.4 % (ref 3.0–12.0)
NEUTROS ABS: 1.6 10*3/uL (ref 1.4–7.7)
Neutrophils Relative %: 39.2 % — ABNORMAL LOW (ref 43.0–77.0)
PLATELETS: 121 10*3/uL — AB (ref 150.0–400.0)
RBC: 4.48 Mil/uL (ref 3.87–5.11)
RDW: 15.3 % (ref 11.5–15.5)
WBC: 4 10*3/uL (ref 4.0–10.5)

## 2015-06-02 LAB — URINALYSIS, ROUTINE W REFLEX MICROSCOPIC
Bilirubin Urine: NEGATIVE
KETONES UR: NEGATIVE
NITRITE: NEGATIVE
PH: 6 (ref 5.0–8.0)
SPECIFIC GRAVITY, URINE: 1.015 (ref 1.000–1.030)
Urine Glucose: NEGATIVE
Urobilinogen, UA: 0.2 (ref 0.0–1.0)

## 2015-06-02 LAB — HEPATIC FUNCTION PANEL
ALT: 18 U/L (ref 0–35)
AST: 18 U/L (ref 0–37)
Albumin: 4.1 g/dL (ref 3.5–5.2)
Alkaline Phosphatase: 51 U/L (ref 39–117)
BILIRUBIN TOTAL: 0.5 mg/dL (ref 0.2–1.2)
Bilirubin, Direct: 0.1 mg/dL (ref 0.0–0.3)
TOTAL PROTEIN: 7.3 g/dL (ref 6.0–8.3)

## 2015-06-02 LAB — BASIC METABOLIC PANEL
BUN: 19 mg/dL (ref 6–23)
CHLORIDE: 105 meq/L (ref 96–112)
CO2: 28 meq/L (ref 19–32)
CREATININE: 1.14 mg/dL (ref 0.40–1.20)
Calcium: 9.4 mg/dL (ref 8.4–10.5)
GFR: 59.06 mL/min — ABNORMAL LOW (ref >60.00)
GLUCOSE: 90 mg/dL (ref 70–99)
Potassium: 3.6 mEq/L (ref 3.5–5.1)
Sodium: 143 mEq/L (ref 135–145)

## 2015-06-02 LAB — LIPID PANEL
CHOL/HDL RATIO: 3
CHOLESTEROL: 180 mg/dL (ref 0–200)
HDL: 52.7 mg/dL (ref >39.00)
LDL Cholesterol: 92 mg/dL (ref 0–99)
NonHDL: 127.17
TRIGLYCERIDES: 177 mg/dL — AB (ref 0.0–149.0)
VLDL: 35.4 mg/dL (ref 0.0–40.0)

## 2015-06-02 LAB — VITAMIN B12: Vitamin B-12: 566 pg/mL (ref 211–911)

## 2015-06-02 LAB — TSH: TSH: 1.66 u[IU]/mL (ref 0.35–4.50)

## 2015-06-02 LAB — VITAMIN D 25 HYDROXY (VIT D DEFICIENCY, FRACTURES): VITD: 29.66 ng/mL — AB (ref 30.00–100.00)

## 2015-06-04 ENCOUNTER — Encounter: Payer: Self-pay | Admitting: Internal Medicine

## 2015-06-04 ENCOUNTER — Ambulatory Visit (INDEPENDENT_AMBULATORY_CARE_PROVIDER_SITE_OTHER): Payer: Medicare Other | Admitting: Internal Medicine

## 2015-06-04 VITALS — BP 138/78 | HR 65 | Wt 225.0 lb

## 2015-06-04 DIAGNOSIS — E559 Vitamin D deficiency, unspecified: Secondary | ICD-10-CM | POA: Diagnosis not present

## 2015-06-04 DIAGNOSIS — E538 Deficiency of other specified B group vitamins: Secondary | ICD-10-CM | POA: Diagnosis not present

## 2015-06-04 DIAGNOSIS — Z23 Encounter for immunization: Secondary | ICD-10-CM | POA: Diagnosis not present

## 2015-06-04 DIAGNOSIS — E669 Obesity, unspecified: Secondary | ICD-10-CM

## 2015-06-04 DIAGNOSIS — I10 Essential (primary) hypertension: Secondary | ICD-10-CM | POA: Diagnosis not present

## 2015-06-04 DIAGNOSIS — N3090 Cystitis, unspecified without hematuria: Secondary | ICD-10-CM | POA: Insufficient documentation

## 2015-06-04 MED ORDER — CIPROFLOXACIN HCL 250 MG PO TABS: 250.0000 mg | ORAL_TABLET | Freq: Two times a day (BID) | ORAL | Status: DC

## 2015-06-04 MED ORDER — ERGOCALCIFEROL 1.25 MG (50000 UT) PO CAPS: 50000.0000 [IU] | ORAL_CAPSULE | ORAL | Status: DC

## 2015-06-04 NOTE — Progress Notes (Signed)
Pre visit review using our clinic review tool, if applicable. No additional management support is needed unless otherwise documented below in the visit note. 

## 2015-06-04 NOTE — Assessment & Plan Note (Signed)
Metoprolol, Maxzide 

## 2015-06-04 NOTE — Assessment & Plan Note (Signed)
On B12 

## 2015-06-04 NOTE — Assessment & Plan Note (Signed)
start Vit D prescription 50000 iu weekly (Rx emailed to your pharmacy) followed by over-the-counter Vit D 1000 iu daily.  

## 2015-06-04 NOTE — Assessment & Plan Note (Signed)
Cipro po °

## 2015-06-04 NOTE — Progress Notes (Signed)
Subjective:  Patient ID: Jasmin [name], female    DOB: [birth year]  Age: 81 y.o. MRN: [id number]  CC: No chief complaint on file.   HPI Jasmin [name] presents for HTN, dyslipidemia, GERD f/u  Outpatient Prescriptions Prior to Visit  Medication Sig Dispense Refill  . capsaicin (ZOSTRIX) 0.025 % cream Apply topically 2 (two) times daily. On R foot 60 g 1  . Cholecalciferol 1000 UNITS tablet Take 1,000 Units by mouth daily.      Marland Kitchen lovastatin (MEVACOR) 40 MG tablet TAKE 1 TABLET AT BEDTIME 90 tablet 3  . metoprolol (LOPRESSOR) 50 MG tablet TAKE 1 TABLET BY MOUTH TWICE A DAY 180 tablet 3  . mupirocin ointment (BACTROBAN) 2 % Use bid on the wound 30 g 0  . omeprazole (PRILOSEC) 20 MG capsule TAKE 1 CAPSULE (20 MG TOTAL) BY MOUTH DAILY. 90 capsule 3  . polyethylene glycol (GLYCOLAX) powder Take 17 g by mouth daily as needed (constipation).     . triamcinolone ointment (KENALOG) 0.5 % Apply topically 2 (two) times daily. 30 g 0  . triamterene-hydrochlorothiazide (MAXZIDE-25) 37.5-25 MG per tablet TAKE 1 EACH (1 TABLET TOTAL) BY MOUTH DAILY. 90 tablet 3  . vitamin B-12 (CYANOCOBALAMIN) 1000 MCG tablet Take 500 mcg by mouth daily.      . clindamycin (CLEOCIN) 300 MG capsule Take 1 capsule (300 mg total) by mouth 3 (three) times daily. (Patient not taking: Reported on 06/04/2015) 21 capsule 0  . hydrocortisone (ANUSOL-HC) 25 MG suppository Place 25 mg rectally 2 (two) times daily.     No facility-administered medications prior to visit.    ROS Review of Systems  Constitutional: Negative for chills, activity change, appetite change, fatigue and unexpected weight change.  HENT: Negative for congestion, mouth sores and sinus pressure.   Eyes: Negative for visual disturbance.  Respiratory: Negative for cough and chest tightness.   Gastrointestinal: Negative for nausea and abdominal pain.  Genitourinary: Negative for frequency, difficulty urinating and vaginal pain.  Musculoskeletal:  Negative for back pain and gait problem.  Skin: Negative for pallor and rash.  Neurological: Negative for dizziness, tremors, weakness, numbness and headaches.  Psychiatric/Behavioral: Negative for confusion and sleep disturbance. The patient is not nervous/anxious.     Objective:  BP 138/78 mmHg  Pulse 65  Wt 225 lb (102.059 kg)  SpO2 99%  BP Readings from Last 3 Encounters:  06/04/15 138/78  03/06/15 140/76  02/18/15 149/82    Wt Readings from Last 3 Encounters:  06/04/15 225 lb (102.059 kg)  03/06/15 222 lb (100.699 kg)  01/29/15 220 lb (99.791 kg)    Physical Exam  Constitutional: She appears well-developed. No distress.  HENT:  Head: Normocephalic.  Right Ear: External ear normal.  Left Ear: External ear normal.  Nose: Nose normal.  Mouth/Throat: Oropharynx is clear and moist.  Eyes: Conjunctivae are normal. Pupils are equal, round, and reactive to light. Right eye exhibits no discharge. Left eye exhibits no discharge.  Neck: Normal range of motion. Neck supple. No JVD present. No tracheal deviation present. No thyromegaly present.  Cardiovascular: Normal rate, regular rhythm and normal heart sounds.   Pulmonary/Chest: No stridor. No respiratory distress. She has no wheezes.  Abdominal: Soft. Bowel sounds are normal. She exhibits no distension and no mass. There is no tenderness. There is no rebound and no guarding.  Musculoskeletal: She exhibits no edema or tenderness.  Lymphadenopathy:    She has no cervical adenopathy.  Neurological: She displays normal reflexes. No cranial  nerve deficit. She exhibits normal muscle tone. Coordination normal.  Skin: No rash noted. No erythema.  Psychiatric: She has a normal mood and affect. Her behavior is normal. Judgment and thought content normal.  Obese  Lab Results  Component Value Date   WBC 4.0 06/02/2015   HGB 13.8 06/02/2015   HCT 41.2 06/02/2015   PLT 121.0* 06/02/2015   GLUCOSE 90 06/02/2015   CHOL 180 06/02/2015     TRIG 177.0* 06/02/2015   HDL 52.70 06/02/2015   LDLDIRECT 139.3 05/31/2012   LDLCALC 92 06/02/2015   ALT 18 06/02/2015   AST 18 06/02/2015   NA 143 06/02/2015   K 3.6 06/02/2015   CL 105 06/02/2015   CREATININE 1.14 06/02/2015   BUN 19 06/02/2015   CO2 28 06/02/2015   TSH 1.66 06/02/2015   INR 1.16 08/16/2009    Mm Digital Screening Bilateral  03/19/2015  CLINICAL DATA:  Screening. EXAM: DIGITAL SCREENING BILATERAL MAMMOGRAM WITH CAD COMPARISON:  Previous exam(s). ACR Breast Density Category b: There are scattered areas of fibroglandular density. FINDINGS: There are no findings suspicious for malignancy. Images were processed with CAD. IMPRESSION: No mammographic evidence of malignancy. A result letter of this screening mammogram will be mailed directly to the patient. RECOMMENDATION: Screening mammogram in one year. (Code:SM-B-01Y) BI-RADS CATEGORY  1: Negative. Electronically Signed   By: Lillia Mountain M.D.   On: 03/19/2015 15:04    Assessment & Plan:   There are no diagnoses linked to this encounter. I am having Ms. Isaac maintain her polyethylene glycol powder, vitamin B-12, Cholecalciferol, hydrocortisone, mupirocin ointment, triamcinolone ointment, capsaicin, triamterene-hydrochlorothiazide, omeprazole, lovastatin, metoprolol, and clindamycin.  No orders of the defined types were placed in this encounter.     Follow-up: No Follow-up on file.  Walker Kehr, MD

## 2015-06-04 NOTE — Assessment & Plan Note (Signed)
Diet discussed 

## 2015-06-04 NOTE — Patient Instructions (Signed)
You have a low Vitamin D levels - please, start Vit D prescription 50000 iu weekly (Rx emailed to your pharmacy) followed by over-the-counter Vit D 1000 iu daily.

## 2015-08-12 ENCOUNTER — Telehealth: Payer: Self-pay

## 2015-08-12 NOTE — Telephone Encounter (Signed)
[employer] sent over forms for [place] to fill out for medication refills. Forms filled out, signed, faxed and sent to scan.

## 2015-08-24 HISTORY — PX: CATARACT EXTRACTION: SUR2

## 2015-09-10 ENCOUNTER — Telehealth: Payer: Self-pay | Admitting: Internal Medicine

## 2015-09-10 ENCOUNTER — Other Ambulatory Visit: Payer: Self-pay | Admitting: Internal Medicine

## 2015-09-10 MED ORDER — TRIAMTERENE-HCTZ 37.5-25 MG PO TABS: 1.0000 | ORAL_TABLET | Freq: Every day | ORAL | Status: DC

## 2015-09-10 NOTE — Telephone Encounter (Signed)
Done. See meds. Pt informed  

## 2015-09-10 NOTE — Telephone Encounter (Signed)
Pt came by office to request refill because she could not get through on phone lines this morning.  Pt now has [employer] Gold Medicare Ins for this calendar year.  Please send a refill of her Triamterene-HCTZ 37.5-25 to [facility] mail order pharmacy.  Fax number is [phone].

## 2015-09-10 NOTE — Telephone Encounter (Signed)
OK to fill this prescription with additional refills x 12 mo Thank you!  

## 2015-10-06 ENCOUNTER — Ambulatory Visit (INDEPENDENT_AMBULATORY_CARE_PROVIDER_SITE_OTHER): Payer: Commercial Managed Care - HMO | Admitting: Internal Medicine

## 2015-10-06 ENCOUNTER — Encounter: Payer: Self-pay | Admitting: Internal Medicine

## 2015-10-06 VITALS — BP 128/78 | HR 67 | Wt 220.0 lb

## 2015-10-06 DIAGNOSIS — Z0000 Encounter for general adult medical examination without abnormal findings: Secondary | ICD-10-CM

## 2015-10-06 DIAGNOSIS — K219 Gastro-esophageal reflux disease without esophagitis: Secondary | ICD-10-CM

## 2015-10-06 DIAGNOSIS — E538 Deficiency of other specified B group vitamins: Secondary | ICD-10-CM

## 2015-10-06 DIAGNOSIS — E785 Hyperlipidemia, unspecified: Secondary | ICD-10-CM

## 2015-10-06 DIAGNOSIS — I10 Essential (primary) hypertension: Secondary | ICD-10-CM

## 2015-10-06 MED ORDER — CHOLECALCIFEROL 25 MCG (1000 UT) PO TABS: 2000.0000 [IU] | ORAL_TABLET | Freq: Every day | ORAL | Status: DC

## 2015-10-06 NOTE — Assessment & Plan Note (Signed)
On Lovastatin

## 2015-10-06 NOTE — Assessment & Plan Note (Signed)
On Prilosec

## 2015-10-06 NOTE — Assessment & Plan Note (Signed)
On B12 

## 2015-10-06 NOTE — Progress Notes (Signed)
Subjective:  Patient ID: Jasmin [name], female    DOB: [birth year]  Age: 82 y.o. MRN: [id number]  CC: No chief complaint on file.   HPI [name] presents for HTN, GERD, dyslipidemia f/u.  Outpatient Prescriptions Prior to Visit  Medication Sig Dispense Refill  . Cholecalciferol 1000 UNITS tablet Take 1,000 Units by mouth daily.      . ergocalciferol (VITAMIN D2) 50000 UNITS capsule Take 1 capsule (50,000 Units total) by mouth once a week. 6 capsule 0  . lovastatin (MEVACOR) 40 MG tablet TAKE 1 TABLET AT BEDTIME 90 tablet 3  . metoprolol (LOPRESSOR) 50 MG tablet TAKE 1 TABLET BY MOUTH TWICE A DAY 180 tablet 3  . mupirocin ointment (BACTROBAN) 2 % Use bid on the wound 30 g 0  . omeprazole (PRILOSEC) 20 MG capsule TAKE 1 CAPSULE (20 MG TOTAL) BY MOUTH DAILY. 90 capsule 3  . polyethylene glycol (GLYCOLAX) powder Take 17 g by mouth daily as needed (constipation).     . triamcinolone ointment (KENALOG) 0.5 % Apply topically 2 (two) times daily. 30 g 0  . triamterene-hydrochlorothiazide (MAXZIDE-25) 37.5-25 MG tablet Take 1 tablet by mouth daily. 90 tablet 3  . vitamin B-12 (CYANOCOBALAMIN) 1000 MCG tablet Take 500 mcg by mouth daily.      . ciprofloxacin (CIPRO) 250 MG tablet Take 1 tablet (250 mg total) by mouth 2 (two) times daily. 10 tablet 0  . capsaicin (ZOSTRIX) 0.025 % cream Apply topically 2 (two) times daily. On R foot (Patient not taking: Reported on 10/06/2015) 60 g 1  . hydrocortisone (ANUSOL-HC) 25 MG suppository Place 25 mg rectally 2 (two) times daily. Reported on 10/06/2015    . clindamycin (CLEOCIN) 300 MG capsule Take 1 capsule (300 mg total) by mouth 3 (three) times daily. (Patient not taking: Reported on 10/06/2015) 21 capsule 0   No facility-administered medications prior to visit.    ROS Review of Systems  Constitutional: Negative for chills, activity change, appetite change, fatigue and unexpected weight change.  HENT: Negative for congestion, mouth  sores and sinus pressure.   Eyes: Negative for visual disturbance.  Respiratory: Negative for cough and chest tightness.   Gastrointestinal: Negative for nausea and abdominal pain.  Genitourinary: Negative for frequency, difficulty urinating and vaginal pain.  Musculoskeletal: Negative for back pain and gait problem.  Skin: Negative for pallor and rash.  Neurological: Negative for dizziness, tremors, weakness, numbness and headaches.  Psychiatric/Behavioral: Negative for confusion and sleep disturbance.    Objective:  BP 128/78 mmHg  Pulse 67  Wt 220 lb (99.791 kg)  SpO2 97%  BP Readings from Last 3 Encounters:  10/06/15 128/78  06/04/15 138/78  03/06/15 140/76    Wt Readings from Last 3 Encounters:  10/06/15 220 lb (99.791 kg)  06/04/15 225 lb (102.059 kg)  03/06/15 222 lb (100.699 kg)    Physical Exam  Constitutional: She appears well-developed. No distress.  HENT:  Head: Normocephalic.  Right Ear: External ear normal.  Left Ear: External ear normal.  Nose: Nose normal.  Mouth/Throat: Oropharynx is clear and moist.  Eyes: Conjunctivae are normal. Pupils are equal, round, and reactive to light. Right eye exhibits no discharge. Left eye exhibits no discharge.  Neck: Normal range of motion. Neck supple. No JVD present. No tracheal deviation present. No thyromegaly present.  Cardiovascular: Normal rate, regular rhythm and normal heart sounds.   Pulmonary/Chest: No stridor. No respiratory distress. She has no wheezes.  Abdominal: Soft. Bowel sounds are normal. She  exhibits no distension and no mass. There is no tenderness. There is no rebound and no guarding.  Musculoskeletal: She exhibits no edema or tenderness.  Lymphadenopathy:    She has no cervical adenopathy.  Neurological: She displays normal reflexes. No cranial nerve deficit. She exhibits normal muscle tone. Coordination normal.  Skin: No rash noted. No erythema.  Psychiatric: She has a normal mood and affect. Her  behavior is normal. Judgment and thought content normal.    Lab Results  Component Value Date   WBC 4.0 06/02/2015   HGB 13.8 06/02/2015   HCT 41.2 06/02/2015   PLT 121.0* 06/02/2015   GLUCOSE 90 06/02/2015   CHOL 180 06/02/2015   TRIG 177.0* 06/02/2015   HDL 52.70 06/02/2015   LDLDIRECT 139.3 05/31/2012   LDLCALC 92 06/02/2015   ALT 18 06/02/2015   AST 18 06/02/2015   NA 143 06/02/2015   K 3.6 06/02/2015   CL 105 06/02/2015   CREATININE 1.14 06/02/2015   BUN 19 06/02/2015   CO2 28 06/02/2015   TSH 1.66 06/02/2015   INR 1.16 08/16/2009    Mm Digital Screening Bilateral  03/19/2015  CLINICAL DATA:  Screening. EXAM: DIGITAL SCREENING BILATERAL MAMMOGRAM WITH CAD COMPARISON:  Previous exam(s). ACR Breast Density Category b: There are scattered areas of fibroglandular density. FINDINGS: There are no findings suspicious for malignancy. Images were processed with CAD. IMPRESSION: No mammographic evidence of malignancy. A result letter of this screening mammogram will be mailed directly to the patient. RECOMMENDATION: Screening mammogram in one year. (Code:SM-B-01Y) BI-RADS CATEGORY  1: Negative. Electronically Signed   By: Lillia Mountain M.D.   On: 03/19/2015 15:04    Assessment & Plan:   There are no diagnoses linked to this encounter. I have discontinued Ms. Tejada's clindamycin and ciprofloxacin. I am also having her maintain her polyethylene glycol powder, vitamin B-12, Cholecalciferol, hydrocortisone, mupirocin ointment, triamcinolone ointment, capsaicin, metoprolol, ergocalciferol, lovastatin, omeprazole, and triamterene-hydrochlorothiazide.  No orders of the defined types were placed in this encounter.     Follow-up: No Follow-up on file.  Walker Kehr, MD

## 2015-10-06 NOTE — Assessment & Plan Note (Signed)
  Chronic Metoprolol, Maxzide

## 2015-10-06 NOTE — Progress Notes (Signed)
Pre visit review using our clinic review tool, if applicable. No additional management support is needed unless otherwise documented below in the visit note. 

## 2015-11-27 ENCOUNTER — Telehealth: Payer: Self-pay | Admitting: Internal Medicine

## 2015-11-27 NOTE — Telephone Encounter (Signed)
Patient Name: Jasmin [name]  DOB: [birth year]    Initial Comment Caller states she has some blood in her stool   Nurse Assessment  Nurse: Raphael Gibney, RN, Vanita Ingles Date/Time (Eastern Time): 11/27/2015 1:15:52 PM  Confirm and document reason for call. If symptomatic, describe symptoms. You must click the next button to save text entered. ---Caller states she saw a little blood in her stool this after straining. She had some blood in her underwear. Only noticed blood today. No pain.  Has the patient traveled out of the country within the last 30 days? ---Not Applicable  Does the patient have any new or worsening symptoms? ---Yes  Will a triage be completed? ---Yes  Related visit to physician within the last 2 weeks? ---Yes  Does the PT have any chronic conditions? (i.e. diabetes, asthma, etc.) ---Yes  List chronic conditions. ---HTN  Is this a behavioral health or substance abuse call? ---No     Guidelines    Guideline Title Affirmed Question Affirmed Notes  Rectal Bleeding MILD rectal bleeding (more than just a few drops or streaks)    Final Disposition User   See PCP When Office is Open (within 3 days) Raphael Gibney, RN, Vanita Ingles    Comments  Appt scheduled for 11/28/15 at 9 am with Lorane Gell at [place]   Disagree/Comply: Comply

## 2015-11-27 NOTE — Telephone Encounter (Signed)
Noted. Thx.

## 2015-11-27 NOTE — Telephone Encounter (Signed)
Apt with Lorane Gell tomorrow routing to PCP for [facility]

## 2015-11-28 ENCOUNTER — Ambulatory Visit (INDEPENDENT_AMBULATORY_CARE_PROVIDER_SITE_OTHER): Payer: Commercial Managed Care - HMO | Admitting: Nurse Practitioner

## 2015-11-28 ENCOUNTER — Encounter: Payer: Self-pay | Admitting: Nurse Practitioner

## 2015-11-28 VITALS — BP 130/70 | HR 66 | Temp 98.3°F | Ht 65.75 in | Wt 222.8 lb

## 2015-11-28 DIAGNOSIS — K649 Unspecified hemorrhoids: Secondary | ICD-10-CM

## 2015-11-28 DIAGNOSIS — K625 Hemorrhage of anus and rectum: Secondary | ICD-10-CM

## 2015-11-28 LAB — HEMOCCULT GUIAC POC 1CARD (OFFICE): Fecal Occult Blood, POC: POSITIVE — AB

## 2015-11-28 MED ORDER — HYDROCORTISONE ACETATE 25 MG RE SUPP: 25.0000 mg | Freq: Two times a day (BID) | RECTAL | Status: DC

## 2015-11-28 NOTE — Progress Notes (Signed)
Patient ID: Jasmin [name], female    DOB: 1933-12-08  Age: 82 y.o. MRN: [id number]  CC: Rectal Bleeding   HPI Jasmin [name] presents for CC of rectal bleeding 1 day.  1) She reports she saw blood in stool yesterday. She has had hard and loose stools all within the last week. She reports some pruritus of the anus and has used an ointment with relief. Has a history of hemorrhoids. Patient has up-to-date colonoscopy on 01/24/2014 by Dr. Henrene Pastor. It was to be repeated in 3 years for personal history of multiple adenomas. Pt had seen blood in underwear later - "a good amount" and it was bright red. This was 1 episode and she reports some burning.   Has been taking omeprazole and concerned about loose stools being related.   Treatment to date: Ointment (unknown name) Suppository- not used in last year   History Jasmin [name] has a past medical history of Diverticulosis of colon; Hyperlipidemia; Hypertension; GERD with stricture; Vitamin B12 deficiency (2009); Hemorrhoids; Hiatal hernia; Polycystic kidney; Esophageal stricture; Tubulovillous adenoma polyp of colon; Onychomycosis; Personal history of urinary calculi; Contact dermatitis and other eczema, due to unspecified cause; and Unspecified vitamin D deficiency.   She has past surgical history that includes Tonsillectomy; Dilation and curettage of uterus (1977); and Toe Surgery (2012).   Her family history includes Hypertension in her father. There is no history of Colon cancer.She reports that she has never smoked. She has never used smokeless tobacco. She reports that she does not drink alcohol or use illicit drugs.  Outpatient Prescriptions Prior to Visit  Medication Sig Dispense Refill  . Cholecalciferol 1000 units tablet Take 2 tablets (2,000 Units total) by mouth daily. 100 tablet 5  . lovastatin (MEVACOR) 40 MG tablet TAKE 1 TABLET AT BEDTIME 90 tablet 3  . metoprolol (LOPRESSOR) 50 MG tablet TAKE 1 TABLET BY MOUTH TWICE A DAY 180  tablet 3  . mupirocin ointment (BACTROBAN) 2 % Use bid on the wound 30 g 0  . omeprazole (PRILOSEC) 20 MG capsule TAKE 1 CAPSULE (20 MG TOTAL) BY MOUTH DAILY. 90 capsule 3  . polyethylene glycol (GLYCOLAX) powder Take 17 g by mouth daily as needed (constipation).     . triamcinolone ointment (KENALOG) 0.5 % Apply topically 2 (two) times daily. 30 g 0  . triamterene-hydrochlorothiazide (MAXZIDE-25) 37.5-25 MG tablet Take 1 tablet by mouth daily. 90 tablet 3  . vitamin B-12 (CYANOCOBALAMIN) 1000 MCG tablet Take 500 mcg by mouth daily.      . ergocalciferol (VITAMIN D2) 50000 UNITS capsule Take 1 capsule (50,000 Units total) by mouth once a week. 6 capsule 0  . hydrocortisone (ANUSOL-HC) 25 MG suppository Place 25 mg rectally 2 (two) times daily. Reported on 10/06/2015     No facility-administered medications prior to visit.    ROS Review of Systems  Constitutional: Negative for fever, chills, diaphoresis and fatigue.  Respiratory: Negative for chest tightness, shortness of breath and wheezing.   Cardiovascular: Negative for chest pain, palpitations and leg swelling.  Gastrointestinal: Positive for diarrhea, constipation, blood in stool and anal bleeding. Negative for nausea, vomiting and rectal pain.  Skin: Negative for rash.  Neurological: Negative for dizziness, weakness, numbness and headaches.  Psychiatric/Behavioral: The patient is not nervous/anxious.     Objective:  BP 130/70 mmHg  Pulse 66  Temp(Src) 98.3 F (36.8 C) (Oral)  Ht 5' 5.75" (1.67 m)  Wt 222 lb 12 oz (101.039 kg)  BMI 36.23 kg/m2  SpO2 98%  Physical Exam  Constitutional: She is oriented to person, place, and time. She appears well-developed and well-nourished. No distress.  HENT:  Head: Normocephalic and atraumatic.  Right Ear: External ear normal.  Left Ear: External ear normal.  Cardiovascular: Normal rate and regular rhythm.   Pulmonary/Chest: Effort normal and breath sounds normal. No respiratory  distress. She has no wheezes. She has no rales. She exhibits no tenderness.  Genitourinary: Guaiac positive stool.  External hemorrhoids slightly tender to palpation, no masses felt in the rectum, small amount of brown stool extracted for guiac no gross bleeding noticed  Neurological: She is alert and oriented to person, place, and time.  Skin: Skin is warm and dry. No rash noted. She is not diaphoretic.  Psychiatric: She has a normal mood and affect. Her behavior is normal. Judgment and thought content normal.   Assessment & Plan:   Jasmin [name] was seen today for rectal bleeding.  Diagnoses and all orders for this visit:  Rectal bleeding -     POCT Occult Blood Stool  Hemorrhoids, unspecified hemorrhoid type  Other orders -     hydrocortisone (ANUSOL-HC) 25 MG suppository; Place 1 suppository (25 mg total) rectally 2 (two) times daily. Reported on 10/06/2015   I have discontinued Jasmin [name]'s ergocalciferol. I have also changed her hydrocortisone. Additionally, I am having her maintain her polyethylene glycol powder, vitamin B-12, mupirocin ointment, triamcinolone ointment, metoprolol, lovastatin, omeprazole, triamterene-hydrochlorothiazide, and Cholecalciferol.  Meds ordered this encounter  Medications  . hydrocortisone (ANUSOL-HC) 25 MG suppository    Sig: Place 1 suppository (25 mg total) rectally 2 (two) times daily. Reported on 10/06/2015    Dispense:  12 suppository    Refill:  0    Order Specific Question:  Supervising Provider    Answer:  Crecencio Mc [[id number]]     Follow-up: Return if symptoms worsen or fail to improve.

## 2015-11-28 NOTE — Progress Notes (Signed)
Pre visit review using our clinic review tool, if applicable. No additional management support is needed unless otherwise documented below in the visit note. 

## 2015-11-28 NOTE — Patient Instructions (Signed)

## 2015-11-28 NOTE — Assessment & Plan Note (Signed)
Recurrence Anusol suppositories sent to pharmacy Discussed stopping the omeprazole and switching to Zantac Patient is due in 1 year for colonoscopy- asked patient to return to GI if black tarry stools occur  FU prn worsening/failure to improve.

## 2015-12-10 ENCOUNTER — Ambulatory Visit (INDEPENDENT_AMBULATORY_CARE_PROVIDER_SITE_OTHER): Payer: Commercial Managed Care - HMO

## 2015-12-10 ENCOUNTER — Telehealth: Payer: Self-pay

## 2015-12-10 VITALS — BP 124/64 | Ht 67.0 in | Wt 224.0 lb

## 2015-12-10 DIAGNOSIS — Z0000 Encounter for general adult medical examination without abnormal findings: Secondary | ICD-10-CM | POA: Diagnosis not present

## 2015-12-10 NOTE — Patient Instructions (Addendum)
Jasmin [name] , Thank you for taking time to come for your Medicare Wellness Visit. I appreciate your ongoing commitment to your health goals. Please review the following plan we discussed and let me know if I can assist you in the future.   Will increase exercise as discussed  Will monitor portions; at meals;   Will send Dr. Posey Rea a note regarding omeprazole causing loose stools; which participated the bleeding she was seen for;  Now is fine; no more bleeding;     These are the goals we discussed: Goals    . Exercise 150 minutes per week (moderate activity)     Continue with silver sneakers; and may consider zumba or water areobics Try to build up to 5 days a week to help with weight loss Walking counts!!        This is a list of the screening recommended for you and due dates:  Health Maintenance  Topic Date Due  . Shingles Vaccine  12/09/1995  . DEXA scan (bone density measurement)  12/08/2000  . Flu Shot  03/23/2016  . Colon Cancer Screening  01/24/2017  . Tetanus Vaccine  03/03/2023  . Pneumonia vaccines  Completed       Bone Densitometry Bone densitometry is an imaging test that uses a special X-ray to measure the amount of calcium and other minerals in your bones (bone density). This test is also known as a bone mineral density test or dual-energy X-ray absorptiometry (DXA). The test can measure bone density at your hip and your spine. It is similar to having a regular X-ray. You may have this test to:  Diagnose a condition that causes weak or thin bones (osteoporosis).  Predict your risk of a broken bone (fracture).  Determine how well osteoporosis treatment is working. LET [facility] CARE PROVIDER KNOW ABOUT:  Any allergies you have.  All medicines you are taking, including vitamins, herbs, eye drops, creams, and over-the-counter medicines.  Previous problems you or members of your family have had with the use of anesthetics.  Any blood disorders  you have.  Previous surgeries you have had.  Medical conditions you have.  Possibility of pregnancy.  Any other medical test you had within the previous 14 days that used contrast material. RISKS AND COMPLICATIONS Generally, this is a safe procedure. However, problems can occur and may include the following:  This test exposes you to a very small amount of radiation.  The risks of radiation exposure may be greater to unborn children. BEFORE THE PROCEDURE  Do not take any calcium supplements for 24 hours before having the test. You can otherwise eat and drink what you usually do.  Take off all metal jewelry, eyeglasses, dental appliances, and any other metal objects. PROCEDURE  You may lie on an exam table. There will be an X-ray generator below you and an imaging device above you.  Other devices, such as boxes or braces, may be used to position your body properly for the scan.  You will need to lie still while the machine slowly scans your body.  The images will show up on a computer monitor. AFTER THE PROCEDURE You may need more testing at a later time.   This information is not intended to replace advice given to you by your health care provider. Make sure you discuss any questions you have with your health care provider.   Document Released: 08/31/2004 Document Revised: 08/30/2014 Document Reviewed: 01/17/2014 Elsevier Interactive Patient Education 2016 [employer].  [place]  Prevention in the Home  Falls can cause injuries. They can happen to people of all ages. There are many things you can do to make your home safe and to help prevent falls.  WHAT CAN I DO ON THE OUTSIDE OF MY HOME?  Regularly fix the edges of walkways and driveways and fix any cracks.  Remove anything that might make you trip as you walk through a door, such as a raised step or threshold.  Trim any bushes or trees on the path to your home.  Use bright outdoor lighting.  Clear any walking paths of  anything that might make someone trip, such as rocks or tools.  Regularly check to see if handrails are loose or broken. Make sure that both sides of any steps have handrails.  Any raised decks and porches should have guardrails on the edges.  Have any leaves, snow, or ice cleared regularly.  Use sand or salt on walking paths during winter.  Clean up any spills in your garage right away. This includes oil or grease spills. WHAT CAN I DO IN THE BATHROOM?   Use night lights.  Install grab bars by the toilet and in the tub and shower. Do not use towel bars as grab bars.  Use non-skid mats or decals in the tub or shower.  If you need to sit down in the shower, use a plastic, non-slip stool.  Keep the floor dry. Clean up any water that spills on the floor as soon as it happens.  Remove soap buildup in the tub or shower regularly.  Attach bath mats securely with double-sided non-slip rug tape.  Do not have throw rugs and other things on the floor that can make you trip. WHAT CAN I DO IN THE BEDROOM?  Use night lights.  Make sure that you have a light by your bed that is easy to reach.  Do not use any sheets or blankets that are too big for your bed. They should not hang down onto the floor.  Have a firm chair that has side arms. You can use this for support while you get dressed.  Do not have throw rugs and other things on the floor that can make you trip. WHAT CAN I DO IN THE KITCHEN?  Clean up any spills right away.  Avoid walking on wet floors.  Keep items that you use a lot in easy-to-reach places.  If you need to reach something above you, use a strong step stool that has a grab bar.  Keep electrical cords out of the way.  Do not use floor polish or wax that makes floors slippery. If you must use wax, use non-skid floor wax.  Do not have throw rugs and other things on the floor that can make you trip. WHAT CAN I DO WITH MY STAIRS?  Do not leave any items on the  stairs.  Make sure that there are handrails on both sides of the stairs and use them. Fix handrails that are broken or loose. Make sure that handrails are as long as the stairways.  Check any carpeting to make sure that it is firmly attached to the stairs. Fix any carpet that is loose or worn.  Avoid having throw rugs at the top or bottom of the stairs. If you do have throw rugs, attach them to the floor with carpet tape.  Make sure that you have a light switch at the top of the stairs and the bottom of the stairs.  If you do not have them, ask someone to add them for you. WHAT ELSE CAN I DO TO HELP PREVENT FALLS?  Wear shoes that:  Do not have high heels.  Have rubber bottoms.  Are comfortable and fit you well.  Are closed at the toe. Do not wear sandals.  If you use a stepladder:  Make sure that it is fully opened. Do not climb a closed stepladder.  Make sure that both sides of the stepladder are locked into place.  Ask someone to hold it for you, if possible.  Clearly mark and make sure that you can see:  Any grab bars or handrails.  First and last steps.  Where the edge of each step is.  Use tools that help you move around (mobility aids) if they are needed. These include:  Canes.  Walkers.  Scooters.  Crutches.  Turn on the lights when you go into a dark area. Replace any light bulbs as soon as they burn out.  Set up your furniture so you have a clear path. Avoid moving your furniture around.  If any of your floors are uneven, fix them.  If there are any pets around you, be aware of where they are.  Review your medicines with your doctor. Some medicines can make you feel dizzy. This can increase your chance of falling. Ask your doctor what other things that you can do to help prevent falls.   This information is not intended to replace advice given to you by your health care provider. Make sure you discuss any questions you have with your health care  provider.   Document Released: 06/05/2009 Document Revised: 12/24/2014 Document Reviewed: 09/13/2014 Elsevier Interactive Patient Education 2016 [place] Maintenance, Female Adopting a healthy lifestyle and getting preventive care can go a long way to promote health and wellness. Talk with your health care provider about what schedule of regular examinations is right for you. This is a good chance for you to check in with your provider about disease prevention and staying healthy. In between checkups, there are plenty of things you can do on your own. Experts have done a lot of research about which lifestyle changes and preventive measures are most likely to keep you healthy. Ask your health care provider for more information. WEIGHT AND DIET  Eat a healthy diet  Be sure to include plenty of vegetables, fruits, low-fat dairy products, and lean protein.  Do not eat a lot of foods high in solid fats, added sugars, or salt.  Get regular exercise. This is one of the most important things you can do for your health.  Most adults should exercise for at least 150 minutes each week. The exercise should increase your heart rate and make you sweat (moderate-intensity exercise).  Most adults should also do strengthening exercises at least twice a week. This is in addition to the moderate-intensity exercise.  Maintain a healthy weight  Body mass index (BMI) is a measurement that can be used to identify possible weight problems. It estimates body fat based on height and weight. Your health care provider can help determine your BMI and help you achieve or maintain a healthy weight.  For females 4 years of age and older:   A BMI below 18.5 is considered underweight.  A BMI of 18.5 to 24.9 is normal.  A BMI of 25 to 29.9 is considered overweight.  A BMI of 30 and above is considered obese.  Watch levels of cholesterol  and blood lipids  You should start having your blood tested  for lipids and cholesterol at 80 years of age, then have this test every 5 years.  You may need to have your cholesterol levels checked more often if:  Your lipid or cholesterol levels are high.  You are older than 80 years of age.  You are at high risk for heart disease.  CANCER SCREENING   Lung Cancer  Lung cancer screening is recommended for adults 87-28 years old who are at high risk for lung cancer because of a history of smoking.  A yearly low-dose CT scan of the lungs is recommended for people who:  Currently smoke.  Have quit within the past 15 years.  Have at least a 30-pack-year history of smoking. A pack year is smoking an average of one pack of cigarettes a day for 1 year.  Yearly screening should continue until it has been 15 years since you quit.  Yearly screening should stop if you develop a health problem that would prevent you from having lung cancer treatment.  Breast Cancer  Practice breast self-awareness. This means understanding how your breasts normally appear and feel.  It also means doing regular breast self-exams. Let your health care provider know about any changes, no matter how small.  If you are in your 20s or 30s, you should have a clinical breast exam (CBE) by a health care provider every 1-3 years as part of a regular health exam.  If you are 52 or older, have a CBE every year. Also consider having a breast X-ray (mammogram) every year.  If you have a family history of breast cancer, talk to your health care provider about genetic screening.  If you are at high risk for breast cancer, talk to your health care provider about having an MRI and a mammogram every year.  Breast cancer gene (BRCA) assessment is recommended for women who have family members with BRCA-related cancers. BRCA-related cancers include:  Breast.  Ovarian.  Tubal.  Peritoneal cancers.  Results of the assessment will determine the need for genetic counseling and  BRCA1 and BRCA2 testing. Cervical Cancer Your health care provider may recommend that you be screened regularly for cancer of the pelvic organs (ovaries, uterus, and vagina). This screening involves a pelvic examination, including checking for microscopic changes to the surface of your cervix (Pap test). You may be encouraged to have this screening done every 3 years, beginning at age 109.  For women ages 69-65, health care providers may recommend pelvic exams and Pap testing every 3 years, or they may recommend the Pap and pelvic exam, combined with testing for human papilloma virus (HPV), every 5 years. Some types of HPV increase your risk of cervical cancer. Testing for HPV may also be done on women of any age with unclear Pap test results.  Other health care providers may not recommend any screening for nonpregnant women who are considered low risk for pelvic cancer and who do not have symptoms. Ask your health care provider if a screening pelvic exam is right for you.  If you have had past treatment for cervical cancer or a condition that could lead to cancer, you need Pap tests and screening for cancer for at least 20 years after your treatment. If Pap tests have been discontinued, your risk factors (such as having a new sexual partner) need to be reassessed to determine if screening should resume. Some women have medical problems that increase the chance of  getting cervical cancer. In these cases, your health care provider may recommend more frequent screening and Pap tests. Colorectal Cancer  This type of cancer can be detected and often prevented.  Routine colorectal cancer screening usually begins at 80 years of age and continues through 80 years of age.  Your health care provider may recommend screening at an earlier age if you have risk factors for colon cancer.  Your health care provider may also recommend using home test kits to check for hidden blood in the stool.  A small camera at  the end of a tube can be used to examine your colon directly (sigmoidoscopy or colonoscopy). This is done to check for the earliest forms of colorectal cancer.  Routine screening usually begins at age 10.  Direct examination of the colon should be repeated every 5-10 years through 80 years of age. However, you may need to be screened more often if early forms of precancerous polyps or small growths are found. Skin Cancer  Check your skin from head to toe regularly.  Tell your health care provider about any new moles or changes in moles, especially if there is a change in a mole's shape or color.  Also tell your health care provider if you have a mole that is larger than the size of a pencil eraser.  Always use sunscreen. Apply sunscreen liberally and repeatedly throughout the day.  Protect yourself by wearing long sleeves, pants, a wide-brimmed hat, and sunglasses whenever you are outside. HEART DISEASE, DIABETES, AND HIGH BLOOD PRESSURE   High blood pressure causes heart disease and increases the risk of stroke. High blood pressure is more likely to develop in:  People who have blood pressure in the high end of the normal range (130-139/85-89 mm Hg).  People who are overweight or obese.  People who are African American.  If you are 58-35 years of age, have your blood pressure checked every 3-5 years. If you are 16 years of age or older, have your blood pressure checked every year. You should have your blood pressure measured twice--once when you are at a hospital or clinic, and once when you are not at a hospital or clinic. Record the average of the two measurements. To check your blood pressure when you are not at a hospital or clinic, you can use:  An automated blood pressure machine at a pharmacy.  A home blood pressure monitor.  If you are between 2 years and 64 years old, ask your health care provider if you should take aspirin to prevent strokes.  Have regular diabetes  screenings. This involves taking a blood sample to check your fasting blood sugar level.  If you are at a normal weight and have a low risk for diabetes, have this test once every three years after 80 years of age.  If you are overweight and have a high risk for diabetes, consider being tested at a younger age or more often. PREVENTING INFECTION  Hepatitis B  If you have a higher risk for hepatitis B, you should be screened for this virus. You are considered at high risk for hepatitis B if:  You were born in a country where hepatitis B is common. Ask your health care provider which countries are considered high risk.  Your parents were born in a high-risk country, and you have not been immunized against hepatitis B (hepatitis B vaccine).  You have HIV or AIDS.  You use needles to inject street drugs.  You live  with someone who has hepatitis B.  You have had sex with someone who has hepatitis B.  You get hemodialysis treatment.  You take certain medicines for conditions, including cancer, organ transplantation, and autoimmune conditions. Hepatitis C  Blood testing is recommended for:  Everyone born from [birth year] through 1963.  Anyone with known risk factors for hepatitis C. Sexually transmitted infections (STIs)  You should be screened for sexually transmitted infections (STIs) including gonorrhea and chlamydia if:  You are sexually active and are younger than 80 years of age.  You are older than 80 years of age and your health care provider tells you that you are at risk for this type of infection.  Your sexual activity has changed since you were last screened and you are at an increased risk for chlamydia or gonorrhea. Ask your health care provider if you are at risk.  If you do not have HIV, but are at risk, it may be recommended that you take a prescription medicine daily to prevent HIV infection. This is called pre-exposure prophylaxis (PrEP). You are considered at risk  if:  You are sexually active and do not regularly use condoms or know the HIV status of your partner(s).  You take drugs by injection.  You are sexually active with a partner who has HIV. Talk with your health care provider about whether you are at high risk of being infected with HIV. If you choose to begin PrEP, you should first be tested for HIV. You should then be tested every 3 months for as long as you are taking PrEP.  PREGNANCY   If you are premenopausal and you may become pregnant, ask your health care provider about preconception counseling.  If you may become pregnant, take 400 to 800 micrograms (mcg) of folic acid every day.  If you want to prevent pregnancy, talk to your health care provider about birth control (contraception). OSTEOPOROSIS AND MENOPAUSE   Osteoporosis is a disease in which the bones lose minerals and strength with aging. This can result in serious bone fractures. Your risk for osteoporosis can be identified using a bone density scan.  If you are 39 years of age or older, or if you are at risk for osteoporosis and fractures, ask your health care provider if you should be screened.  Ask your health care provider whether you should take a calcium or vitamin D supplement to lower your risk for osteoporosis.  Menopause may have certain physical symptoms and risks.  Hormone replacement therapy may reduce some of these symptoms and risks. Talk to your health care provider about whether hormone replacement therapy is right for you.  HOME CARE INSTRUCTIONS   Schedule regular health, dental, and eye exams.  Stay current with your immunizations.   Do not use any tobacco products including cigarettes, chewing tobacco, or electronic cigarettes.  If you are pregnant, do not drink alcohol.  If you are breastfeeding, limit how much and how often you drink alcohol.  Limit alcohol intake to no more than 1 drink per day for nonpregnant women. One drink equals 12  ounces of beer, 5 ounces of wine, or 1 ounces of hard liquor.  Do not use street drugs.  Do not share needles.  Ask your health care provider for help if you need support or information about quitting drugs.  Tell your health care provider if you often feel depressed.  Tell your health care provider if you have ever been abused or do not feel safe at  at home.   This information is not intended to replace advice given to you by your health care provider. Make sure you discuss any questions you have with your health care provider.   Document Released: 02/22/2011 Document Revised: 08/30/2014 Document Reviewed: 07/11/2013 Elsevier Interactive Patient Education 2016 [employer]  

## 2015-12-10 NOTE — Telephone Encounter (Signed)
This patient in for AWV; States she had diarrhea with omeprazole and this developed into some minor rectal bleeding; She was seen but Lorane Gell NP last week and states she has had no further rectal bleeding after annusol tx.  Did not restart the omeprazole and is better;  Apt for June and will fup at that time or sooner if needed;   Also call to [facility] to get report of dexa; They did not have a report but she has been seen; can discuss need to repeat Dexa in June;

## 2015-12-10 NOTE — Progress Notes (Addendum)
Subjective:   Jasmin [name] is a 82 y.o. female who presents for Medicare Annual (Subsequent) preventive examination.  Review of Systems:  HRA assessment completed during visit; Lamarque, Roderica  The Patient was informed that this wellness visit is to identify risk and educate on how to reduce risk for increase disease through lifestyle changes.   ROS deferred to CPE exam with physician  Medical and family hx Father HTN  Medical issues  Recent blood in stools/ took anusol and no more bright red bleeding; none noted in underwear; had had a BM; states her bowels had moved very quickly; stopped omeprazole; Taking zantac now;   Hyperlipidemia; cho 180; trig 177; HDL 52; LDL 92; (glucose 90)  HTN/ controlled medically Vit D deficiency/ taking Vit D  Tobacco: never smoked  ETOH: no use  Medication review/ New meds   BMI: 35.1  Diet;  Eggs and grits  Lunch vegetable and meat (2pm) Supper; cup of jello; snack    Exercise;  Does the silver sneakers at [employer] 3 days a week; and housekeeping;  Is thinking about taking zumba Did do water aerobics to heal sciatic in left leg; started at spine;  Recommended she start water aerobics again and will consider    SAFETY; split level  Safety reviewed for the home;  Bedrooms upstairs  Thinking about moving; (has option of one son  in [place] with apt in home) not sure at present if they will move   Bathroom safety; both bathrooms upstairs;  Falls; no falls  Discussed LTC plan and having less energy to make move;  [employer] safety; good neighbors  Oceanographer yes Firearms safety / keep in a safe place  Driving accidents and seatbelt/ no Sun protection/ no; educated  Stressors 1-5; not stressed  Depression no  Mobilization and Functional losses in the last year.no changes; wakes up between  and 5am and 6am; studies the bible and then starts her day  Sleep patterns/ sleeps well; wakes up 2am/ falls back to sleep    Discussed sleep etiquette    Urinary or fecal incontinence reviewed/ no issues at present   Counseling: Colonoscopy; 01/2014 ; repeat in 3 years / 01/2017 EKG: 05/2012 Mammogram [facility]/19/2015- [facility] / repeat annually Dexa no record- had one x 5 years ago on [address];  Call to [place] and no record but she has been seen there;  Will defer repeat dexa to Dr. Alain Marion PAP 01/2001 Hearing: 4000 in right and 2000 in left  Ophthalmology exam; May 2   Immunizations: shingles; can check with insurance regarding cost at pharmacy vs office   Not sure if she had chicken pox but Dr. Alain Marion advised her to take it; may discuss at her next visit   Advanced Directive; given copy of AD and niece will assist   Health advice or referrals  Eye apt 12/23/2015  Current Care Team reviewed and updated   Cardiac Risk Factors include: advanced age (>43men, >22 women);dyslipidemia;hypertension;obesity (BMI >30kg/m2)     Objective:     Vitals: BP 124/64 mmHg  Ht 5\' 7"  (1.702 m)  Wt 224 lb (101.606 kg)  BMI 35.08 kg/m2  Body mass index is 35.08 kg/(m^2).   Tobacco History  Smoking status  . Never Smoker   Smokeless tobacco  . Never Used     Counseling given: Yes   Past Medical History  Diagnosis Date  . Diverticulosis of colon   . Hyperlipidemia   . Hypertension   .  GERD with stricture     Dr Henrene Pastor  . Vitamin B12 deficiency 2009    Borderline  . Hemorrhoids   . Hiatal hernia   . Polycystic kidney   . Esophageal stricture   . Tubulovillous adenoma polyp of colon   . Onychomycosis   . Personal history of urinary calculi   . Contact dermatitis and other eczema, due to unspecified cause   . Unspecified vitamin D deficiency    Past Surgical History  Procedure Laterality Date  . Tonsillectomy      as child  . Dilation and curettage of uterus  1977  . Toe surgery  2012    Right foot    Family History  Problem Relation Age of Onset  . Colon cancer Neg Hx    . Hypertension Father    History  Sexual Activity  . Sexual Activity: Yes    Outpatient Encounter Prescriptions as of 12/10/2015  Medication Sig  . Cholecalciferol 1000 units tablet Take 2 tablets (2,000 Units total) by mouth daily.  Marland Kitchen lovastatin (MEVACOR) 40 MG tablet TAKE 1 TABLET AT BEDTIME  . metoprolol (LOPRESSOR) 50 MG tablet TAKE 1 TABLET BY MOUTH TWICE A DAY  . mupirocin ointment (BACTROBAN) 2 % Use bid on the wound  . triamcinolone ointment (KENALOG) 0.5 % Apply topically 2 (two) times daily.  Marland Kitchen triamterene-hydrochlorothiazide (MAXZIDE-25) 37.5-25 MG tablet Take 1 tablet by mouth daily.  . vitamin B-12 (CYANOCOBALAMIN) 1000 MCG tablet Take 500 mcg by mouth daily.    . hydrocortisone (ANUSOL-HC) 25 MG suppository Place 1 suppository (25 mg total) rectally 2 (two) times daily. Reported on 10/06/2015 (Patient not taking: Reported on 12/10/2015)  . omeprazole (PRILOSEC) 20 MG capsule TAKE 1 CAPSULE (20 MG TOTAL) BY MOUTH DAILY. (Patient not taking: Reported on 12/10/2015)  . polyethylene glycol (GLYCOLAX) powder Take 17 g by mouth daily as needed (constipation). Reported on 12/10/2015   No facility-administered encounter medications on file as of 12/10/2015.    Activities of Daily Living In your present state of health, do you have any difficulty performing the following activities: 12/10/2015  Hearing? N  Vision? N  Difficulty concentrating or making decisions? N  Walking or climbing stairs? N  Dressing or bathing? N  Doing errands, shopping? N  Preparing Food and eating ? N  Using the Toilet? N  In the past six months, have you accidently leaked urine? N  Do you have problems with loss of bowel control? (No Data)  Managing your Medications? N  Managing your Finances? N  Housekeeping or managing your Housekeeping? N    Patient Care Team: Cassandria Anger, MD as PCP - General Irene Shipper, MD (Gastroenterology) Bobbye Charleston, MD (Obstetrics and Gynecology)     Assessment:    Exercise Activities and Dietary recommendations Current Exercise Habits: Structured exercise class, Type of exercise: strength training/weights;Other - see comments, Time (Minutes): 60, Frequency (Times/Week): 5 (was going to increase exercise to 60 min class at least 5 days a week for weight loss), Weekly Exercise (Minutes/Week): 300, Intensity: Moderate  Goals    . Exercise 150 minutes per week (moderate activity)     Continue with silver sneakers; and may consider zumba or water areobics Try to build up to 5 days a week to help with weight loss Walking counts!!       Fall Risk Fall Risk  12/10/2015 01/29/2015  Falls in the past year? No No   Depression Screen PHQ 2/9 Scores 12/10/2015 01/29/2015  PHQ -  2 Score 0 0     Cognitive Testing No flowsheet data found.  Immunization History  Administered Date(s) Administered  . Influenza Split 05/20/2011, 05/31/2012  . Influenza Whole 08/24/2005, 06/21/2008, 06/10/2009  . Influenza,inj,Quad PF,36+ Mos 05/22/2013, 05/29/2014, 06/04/2015  . Pneumococcal Conjugate-13 09/21/2013  . Pneumococcal Polysaccharide-23 08/25/2004, 11/12/2009, 05/31/2012  . Td 03/02/2013   Screening Tests Health Maintenance  Topic Date Due  . ZOSTAVAX  12/09/1995  . DEXA SCAN  12/08/2000  . INFLUENZA VACCINE  03/23/2016  . COLONOSCOPY  01/24/2017  . TETANUS/TDAP  03/03/2023  . PNA vac Low Risk Adult  Completed      Plan:  Will increase exercise as discussed  Will monitor portions; at meals;   Will send Dr. Alain Marion a note regarding omeprazole causing loose stools; which participated the bleeding she was seen for;  Now is fine; no more bleeding;     During the course of the visit the patient was educated and counseled about the following appropriate screening and preventive services:   Vaccines to include Pneumoccal, Influenza, Hepatitis B, Td, Zostavax, HCV/ will discuss shingles vaccine with Dr. Alain Marion in June    Electrocardiogram 05/2012  Cardiovascular Disease/ med for htn; well controlled today  Colorectal cancer screening/ repeat 01/2017  Bone density screening/ checked with [facility] and no record but has been seen; will discuss repeat dexa with Dr. Alain Marion  Diabetes screening. neg  Glaucoma screening/ Eye exam May 2  Mammography/02/2015  Nutrition counseling / discussed weight loss; portion control; and increasing exercise to 5 classes a week;   Patient Instructions (the written plan) was given to the patient.   Wynetta Fines, RN  12/10/2015     Medical screening examination/treatment/procedure(s) were performed by non-physician practitioner and as supervising physician I was immediately available for consultation/collaboration. I agree with above. Walker Kehr, MD

## 2016-01-27 ENCOUNTER — Other Ambulatory Visit (INDEPENDENT_AMBULATORY_CARE_PROVIDER_SITE_OTHER): Payer: Commercial Managed Care - HMO

## 2016-01-27 DIAGNOSIS — E785 Hyperlipidemia, unspecified: Secondary | ICD-10-CM

## 2016-01-27 DIAGNOSIS — E538 Deficiency of other specified B group vitamins: Secondary | ICD-10-CM | POA: Diagnosis not present

## 2016-01-27 DIAGNOSIS — I10 Essential (primary) hypertension: Secondary | ICD-10-CM

## 2016-01-27 DIAGNOSIS — K219 Gastro-esophageal reflux disease without esophagitis: Secondary | ICD-10-CM

## 2016-01-27 DIAGNOSIS — Z0000 Encounter for general adult medical examination without abnormal findings: Secondary | ICD-10-CM

## 2016-01-27 LAB — LIPID PANEL
Cholesterol: 169 mg/dL (ref 0–200)
HDL: 55.3 mg/dL (ref >39.00)
LDL Cholesterol: 91 mg/dL (ref 0–99)
NonHDL: 113.53
Total CHOL/HDL Ratio: 3
Triglycerides: 112 mg/dL (ref 0.0–149.0)
VLDL: 22.4 mg/dL (ref 0.0–40.0)

## 2016-01-27 LAB — BASIC METABOLIC PANEL
BUN: 22 mg/dL (ref 6–23)
CO2: 29 mEq/L (ref 19–32)
CREATININE: 1.17 mg/dL (ref 0.40–1.20)
Calcium: 9.6 mg/dL (ref 8.4–10.5)
Chloride: 102 mEq/L (ref 96–112)
GFR: 57.22 mL/min — ABNORMAL LOW (ref >60.00)
Glucose, Bld: 97 mg/dL (ref 70–99)
Potassium: 3.5 mEq/L (ref 3.5–5.1)
Sodium: 140 mEq/L (ref 135–145)

## 2016-01-27 LAB — TSH: TSH: 2.2 u[IU]/mL (ref 0.35–4.50)

## 2016-01-27 LAB — CBC WITH DIFFERENTIAL/PLATELET
BASOS ABS: 0 10*3/uL (ref 0.0–0.1)
Basophils Relative: 0.4 % (ref 0.0–3.0)
Eosinophils Absolute: 0 10*3/uL (ref 0.0–0.7)
Eosinophils Relative: 0.9 % (ref 0.0–5.0)
HEMATOCRIT: 41.5 % (ref 36.0–46.0)
HEMOGLOBIN: 14.1 g/dL (ref 12.0–15.0)
Lymphs Abs: 2 10*3/uL (ref 0.7–4.0)
MCHC: 34.1 g/dL (ref 30.0–36.0)
MCV: 90.2 fl (ref 78.0–100.0)
MONOS PCT: 9.7 % (ref 3.0–12.0)
Monocytes Absolute: 0.3 10*3/uL (ref 0.1–1.0)
Neutro Abs: 1.2 10*3/uL — ABNORMAL LOW (ref 1.4–7.7)
Neutrophils Relative %: 32.6 % — ABNORMAL LOW (ref 43.0–77.0)
Platelets: 120 10*3/uL — ABNORMAL LOW (ref 150.0–400.0)
RBC: 4.59 Mil/uL (ref 3.87–5.11)
RDW: 14.7 % (ref 11.5–15.5)
WBC: 3.6 10*3/uL — AB (ref 4.0–10.5)

## 2016-01-27 LAB — URINALYSIS
Bilirubin Urine: NEGATIVE
KETONES UR: NEGATIVE
LEUKOCYTES UA: NEGATIVE
Nitrite: NEGATIVE
PH: 5.5 (ref 5.0–8.0)
SPECIFIC GRAVITY, URINE: 1.025 (ref 1.000–1.030)
TOTAL PROTEIN, URINE-UPE24: NEGATIVE
Urine Glucose: NEGATIVE
Urobilinogen, UA: 0.2 (ref 0.0–1.0)

## 2016-01-27 LAB — HEPATIC FUNCTION PANEL
ALT: 15 U/L (ref 0–35)
AST: 16 U/L (ref 0–37)
Albumin: 4.4 g/dL (ref 3.5–5.2)
Alkaline Phosphatase: 53 U/L (ref 39–117)
Bilirubin, Direct: 0.1 mg/dL (ref 0.0–0.3)
Total Bilirubin: 0.5 mg/dL (ref 0.2–1.2)
Total Protein: 7.4 g/dL (ref 6.0–8.3)

## 2016-02-03 ENCOUNTER — Encounter: Payer: Commercial Managed Care - HMO | Admitting: Internal Medicine

## 2016-02-13 ENCOUNTER — Encounter: Payer: Self-pay | Admitting: Internal Medicine

## 2016-02-13 ENCOUNTER — Ambulatory Visit (INDEPENDENT_AMBULATORY_CARE_PROVIDER_SITE_OTHER): Payer: Commercial Managed Care - HMO | Admitting: Internal Medicine

## 2016-02-13 VITALS — BP 140/80 | HR 66 | Ht 66.5 in | Wt 224.0 lb

## 2016-02-13 DIAGNOSIS — K219 Gastro-esophageal reflux disease without esophagitis: Secondary | ICD-10-CM | POA: Diagnosis not present

## 2016-02-13 DIAGNOSIS — Z0001 Encounter for general adult medical examination with abnormal findings: Secondary | ICD-10-CM

## 2016-02-13 DIAGNOSIS — I10 Essential (primary) hypertension: Secondary | ICD-10-CM

## 2016-02-13 DIAGNOSIS — R197 Diarrhea, unspecified: Secondary | ICD-10-CM | POA: Insufficient documentation

## 2016-02-13 DIAGNOSIS — Z0000 Encounter for general adult medical examination without abnormal findings: Secondary | ICD-10-CM

## 2016-02-13 NOTE — Progress Notes (Signed)
Pre visit review using our clinic review tool, if applicable. No additional management support is needed unless otherwise documented below in the visit note. 

## 2016-02-13 NOTE — Patient Instructions (Signed)
Preventive Care for Adults, Female A healthy lifestyle and preventive care can promote health and wellness. Preventive health guidelines for women include the following key practices.  A routine yearly physical is a good way to check with your health care provider about your health and preventive screening. It is a chance to share any concerns and updates on your health and to receive a thorough exam.  Visit your dentist for a routine exam and preventive care every 6 months. Brush your teeth twice a day and floss once a day. Good oral hygiene prevents tooth decay and gum disease.  The frequency of eye exams is based on your age, health, family medical history, use of contact lenses, and other factors. Follow your health care provider's recommendations for frequency of eye exams.  Eat a healthy diet. Foods like vegetables, fruits, whole grains, low-fat dairy products, and lean protein foods contain the nutrients you need without too many calories. Decrease your intake of foods high in solid fats, added sugars, and salt. Eat the right amount of calories for you.Get information about a proper diet from your health care provider, if necessary.  Regular physical exercise is one of the most important things you can do for your health. Most adults should get at least 150 minutes of moderate-intensity exercise (any activity that increases your heart rate and causes you to sweat) each week. In addition, most adults need muscle-strengthening exercises on 2 or more days a week.  Maintain a healthy weight. The body mass index (BMI) is a screening tool to identify possible weight problems. It provides an estimate of body fat based on height and weight. Your health care provider can find your BMI and can help you achieve or maintain a healthy weight.For adults 20 years and older:  A BMI below 18.5 is considered underweight.  A BMI of 18.5 to 24.9 is normal.  A BMI of 25 to 29.9 is considered overweight.  A  BMI of 30 and above is considered obese.  Maintain normal blood lipids and cholesterol levels by exercising and minimizing your intake of saturated fat. Eat a balanced diet with plenty of fruit and vegetables. Blood tests for lipids and cholesterol should begin at age 45 and be repeated every 5 years. If your lipid or cholesterol levels are high, you are over 50, or you are at high risk for heart disease, you may need your cholesterol levels checked more frequently.Ongoing high lipid and cholesterol levels should be treated with medicines if diet and exercise are not working.  If you smoke, find out from your health care provider how to quit. If you do not use tobacco, do not start.  Lung cancer screening is recommended for adults aged 45-80 years who are at high risk for developing lung cancer because of a history of smoking. A yearly low-dose CT scan of the lungs is recommended for people who have at least a 30-pack-year history of smoking and are a current smoker or have quit within the past 15 years. A pack year of smoking is smoking an average of 1 pack of cigarettes a day for 1 year (for example: 1 pack a day for 30 years or 2 packs a day for 15 years). Yearly screening should continue until the smoker has stopped smoking for at least 15 years. Yearly screening should be stopped for people who develop a health problem that would prevent them from having lung cancer treatment.  If you are pregnant, do not drink alcohol. If you are  breastfeeding, be very cautious about drinking alcohol. If you are not pregnant and choose to drink alcohol, do not have more than 1 drink per day. One drink is considered to be 12 ounces (355 mL) of beer, 5 ounces (148 mL) of wine, or 1.5 ounces (44 mL) of liquor.  Avoid use of street drugs. Do not share needles with anyone. Ask for help if you need support or instructions about stopping the use of drugs.  High blood pressure causes heart disease and increases the risk  of stroke. Your blood pressure should be checked at least every 1 to 2 years. Ongoing high blood pressure should be treated with medicines if weight loss and exercise do not work.  If you are 55-79 years old, ask your health care provider if you should take aspirin to prevent strokes.  Diabetes screening is done by taking a blood sample to check your blood glucose level after you have not eaten for a certain period of time (fasting). If you are not overweight and you do not have risk factors for diabetes, you should be screened once every 3 years starting at age 45. If you are overweight or obese and you are 40-70 years of age, you should be screened for diabetes every year as part of your cardiovascular risk assessment.  Breast cancer screening is essential preventive care for women. You should practice "breast self-awareness." This means understanding the normal appearance and feel of your breasts and may include breast self-examination. Any changes detected, no matter how small, should be reported to a health care provider. Women in their 20s and 30s should have a clinical breast exam (CBE) by a health care provider as part of a regular health exam every 1 to 3 years. After age 40, women should have a CBE every year. Starting at age 40, women should consider having a mammogram (breast X-ray test) every year. Women who have a family history of breast cancer should talk to their health care provider about genetic screening. Women at a high risk of breast cancer should talk to their health care providers about having an MRI and a mammogram every year.  Breast cancer gene (BRCA)-related cancer risk assessment is recommended for women who have family members with BRCA-related cancers. BRCA-related cancers include breast, ovarian, tubal, and peritoneal cancers. Having family members with these cancers may be associated with an increased risk for harmful changes (mutations) in the breast cancer genes BRCA1 and  BRCA2. Results of the assessment will determine the need for genetic counseling and BRCA1 and BRCA2 testing.  Your health care provider may recommend that you be screened regularly for cancer of the pelvic organs (ovaries, uterus, and vagina). This screening involves a pelvic examination, including checking for microscopic changes to the surface of your cervix (Pap test). You may be encouraged to have this screening done every 3 years, beginning at age 21.  For women ages 30-65, health care providers may recommend pelvic exams and Pap testing every 3 years, or they may recommend the Pap and pelvic exam, combined with testing for human papilloma virus (HPV), every 5 years. Some types of HPV increase your risk of cervical cancer. Testing for HPV may also be done on women of any age with unclear Pap test results.  Other health care providers may not recommend any screening for nonpregnant women who are considered low risk for pelvic cancer and who do not have symptoms. Ask your health care provider if a screening pelvic exam is right for   you.  If you have had past treatment for cervical cancer or a condition that could lead to cancer, you need Pap tests and screening for cancer for at least 20 years after your treatment. If Pap tests have been discontinued, your risk factors (such as having a new sexual partner) need to be reassessed to determine if screening should resume. Some women have medical problems that increase the chance of getting cervical cancer. In these cases, your health care provider may recommend more frequent screening and Pap tests.  Colorectal cancer can be detected and often prevented. Most routine colorectal cancer screening begins at the age of 50 years and continues through age 75 years. However, your health care provider may recommend screening at an earlier age if you have risk factors for colon cancer. On a yearly basis, your health care provider may provide home test kits to check  for hidden blood in the stool. Use of a small camera at the end of a tube, to directly examine the colon (sigmoidoscopy or colonoscopy), can detect the earliest forms of colorectal cancer. Talk to your health care provider about this at age 50, when routine screening begins. Direct exam of the colon should be repeated every 5-10 years through age 75 years, unless early forms of precancerous polyps or small growths are found.  People who are at an increased risk for hepatitis B should be screened for this virus. You are considered at high risk for hepatitis B if:  You were born in a country where hepatitis B occurs often. Talk with your health care provider about which countries are considered high risk.  Your parents were born in a high-risk country and you have not received a shot to protect against hepatitis B (hepatitis B vaccine).  You have HIV or AIDS.  You use needles to inject street drugs.  You live with, or have sex with, someone who has hepatitis B.  You get hemodialysis treatment.  You take certain medicines for conditions like cancer, organ transplantation, and autoimmune conditions.  Hepatitis C blood testing is recommended for all people born from 1945 through 1965 and any individual with known risks for hepatitis C.  Practice safe sex. Use condoms and avoid high-risk sexual practices to reduce the spread of sexually transmitted infections (STIs). STIs include gonorrhea, chlamydia, syphilis, trichomonas, herpes, HPV, and human immunodeficiency virus (HIV). Herpes, HIV, and HPV are viral illnesses that have no cure. They can result in disability, cancer, and death.  You should be screened for sexually transmitted illnesses (STIs) including gonorrhea and chlamydia if:  You are sexually active and are younger than 24 years.  You are older than 24 years and your health care provider tells you that you are at risk for this type of infection.  Your sexual activity has changed  since you were last screened and you are at an increased risk for chlamydia or gonorrhea. Ask your health care provider if you are at risk.  If you are at risk of being infected with HIV, it is recommended that you take a prescription medicine daily to prevent HIV infection. This is called preexposure prophylaxis (PrEP). You are considered at risk if:  You are sexually active and do not regularly use condoms or know the HIV status of your partner(s).  You take drugs by injection.  You are sexually active with a partner who has HIV.  Talk with your health care provider about whether you are at high risk of being infected with HIV. If   you choose to begin PrEP, you should first be tested for HIV. You should then be tested every 3 months for as long as you are taking PrEP.  Osteoporosis is a disease in which the bones lose minerals and strength with aging. This can result in serious bone fractures or breaks. The risk of osteoporosis can be identified using a bone density scan. Women ages 67 years and over and women at risk for fractures or osteoporosis should discuss screening with their health care providers. Ask your health care provider whether you should take a calcium supplement or vitamin D to reduce the rate of osteoporosis.  Menopause can be associated with physical symptoms and risks. Hormone replacement therapy is available to decrease symptoms and risks. You should talk to your health care provider about whether hormone replacement therapy is right for you.  Use sunscreen. Apply sunscreen liberally and repeatedly throughout the day. You should seek shade when your shadow is shorter than you. Protect yourself by wearing long sleeves, pants, a wide-brimmed hat, and sunglasses year round, whenever you are outdoors.  Once a month, do a whole body skin exam, using a mirror to look at the skin on your back. Tell your health care provider of new moles, moles that have irregular borders, moles that  are larger than a pencil eraser, or moles that have changed in shape or color.  Stay current with required vaccines (immunizations).  Influenza vaccine. All adults should be immunized every year.  Tetanus, diphtheria, and acellular pertussis (Td, Tdap) vaccine. Pregnant women should receive 1 dose of Tdap vaccine during each pregnancy. The dose should be obtained regardless of the length of time since the last dose. Immunization is preferred during the 27th-36th week of gestation. An adult who has not previously received Tdap or who does not know her vaccine status should receive 1 dose of Tdap. This initial dose should be followed by tetanus and diphtheria toxoids (Td) booster doses every 10 years. Adults with an unknown or incomplete history of completing a 3-dose immunization series with Td-containing vaccines should begin or complete a primary immunization series including a Tdap dose. Adults should receive a Td booster every 10 years.  Varicella vaccine. An adult without evidence of immunity to varicella should receive 2 doses or a second dose if she has previously received 1 dose. Pregnant females who do not have evidence of immunity should receive the first dose after pregnancy. This first dose should be obtained before leaving the health care facility. The second dose should be obtained 4-8 weeks after the first dose.  Human papillomavirus (HPV) vaccine. Females aged 13-26 years who have not received the vaccine previously should obtain the 3-dose series. The vaccine is not recommended for use in pregnant females. However, pregnancy testing is not needed before receiving a dose. If a female is found to be pregnant after receiving a dose, no treatment is needed. In that case, the remaining doses should be delayed until after the pregnancy. Immunization is recommended for any person with an immunocompromised condition through the age of 61 years if she did not get any or all doses earlier. During the  3-dose series, the second dose should be obtained 4-8 weeks after the first dose. The third dose should be obtained 24 weeks after the first dose and 16 weeks after the second dose.  Zoster vaccine. One dose is recommended for adults aged 30 years or older unless certain conditions are present.  Measles, mumps, and rubella (MMR) vaccine. Adults born  before 1957 generally are considered immune to measles and mumps. Adults born in 1957 or later should have 1 or more doses of MMR vaccine unless there is a contraindication to the vaccine or there is laboratory evidence of immunity to each of the three diseases. A routine second dose of MMR vaccine should be obtained at least 28 days after the first dose for students attending postsecondary schools, health care workers, or international travelers. People who received inactivated measles vaccine or an unknown type of measles vaccine during 1963-1967 should receive 2 doses of MMR vaccine. People who received inactivated mumps vaccine or an unknown type of mumps vaccine before 1979 and are at high risk for mumps infection should consider immunization with 2 doses of MMR vaccine. For females of childbearing age, rubella immunity should be determined. If there is no evidence of immunity, females who are not pregnant should be vaccinated. If there is no evidence of immunity, females who are pregnant should delay immunization until after pregnancy. Unvaccinated health care workers born before 1957 who lack laboratory evidence of measles, mumps, or rubella immunity or laboratory confirmation of disease should consider measles and mumps immunization with 2 doses of MMR vaccine or rubella immunization with 1 dose of MMR vaccine.  Pneumococcal 13-valent conjugate (PCV13) vaccine. When indicated, a person who is uncertain of his immunization history and has no record of immunization should receive the PCV13 vaccine. All adults 65 years of age and older should receive this  vaccine. An adult aged 19 years or older who has certain medical conditions and has not been previously immunized should receive 1 dose of PCV13 vaccine. This PCV13 should be followed with a dose of pneumococcal polysaccharide (PPSV23) vaccine. Adults who are at high risk for pneumococcal disease should obtain the PPSV23 vaccine at least 8 weeks after the dose of PCV13 vaccine. Adults older than 80 years of age who have normal immune system function should obtain the PPSV23 vaccine dose at least 1 year after the dose of PCV13 vaccine.  Pneumococcal polysaccharide (PPSV23) vaccine. When PCV13 is also indicated, PCV13 should be obtained first. All adults aged 65 years and older should be immunized. An adult younger than age 65 years who has certain medical conditions should be immunized. Any person who resides in a nursing home or long-term care facility should be immunized. An adult smoker should be immunized. People with an immunocompromised condition and certain other conditions should receive both PCV13 and PPSV23 vaccines. People with human immunodeficiency virus (HIV) infection should be immunized as soon as possible after diagnosis. Immunization during chemotherapy or radiation therapy should be avoided. Routine use of PPSV23 vaccine is not recommended for American Indians, [place] Natives, or people younger than 65 years unless there are medical conditions that require PPSV23 vaccine. When indicated, people who have unknown immunization and have no record of immunization should receive PPSV23 vaccine. One-time revaccination 5 years after the first dose of PPSV23 is recommended for people aged 19-64 years who have chronic kidney failure, nephrotic syndrome, asplenia, or immunocompromised conditions. People who received 1-2 doses of PPSV23 before age 65 years should receive another dose of PPSV23 vaccine at age 65 years or later if at least 5 years have passed since the previous dose. Doses of PPSV23 are not  needed for people immunized with PPSV23 at or after age 65 years.  Meningococcal vaccine. Adults with asplenia or persistent complement component deficiencies should receive 2 doses of quadrivalent meningococcal conjugate (MenACWY-D) vaccine. The doses should be obtained   at least 2 months apart. Microbiologists working with certain meningococcal bacteria, [place] recruits, people at risk during an outbreak, and people who travel to or live in countries with a high rate of meningitis should be immunized. A first-year college student up through age 34 years who is living in a residence hall should receive a dose if she did not receive a dose on or after her 16th birthday. Adults who have certain high-risk conditions should receive one or more doses of vaccine.  Hepatitis A vaccine. Adults who wish to be protected from this disease, have certain high-risk conditions, work with hepatitis A-infected animals, work in hepatitis A research labs, or travel to or work in countries with a high rate of hepatitis A should be immunized. Adults who were previously unvaccinated and who anticipate close contact with an international adoptee during the first 60 days after arrival in the [place] from a country with a high rate of hepatitis A should be immunized.  Hepatitis B vaccine. Adults who wish to be protected from this disease, have certain high-risk conditions, may be exposed to blood or other infectious body fluids, are household contacts or sex partners of hepatitis B positive people, are clients or workers in certain care facilities, or travel to or work in countries with a high rate of hepatitis B should be immunized.  Haemophilus influenzae type b (Hib) vaccine. A previously unvaccinated person with asplenia or sickle cell disease or having a scheduled splenectomy should receive 1 dose of Hib vaccine. Regardless of previous immunization, a recipient of a hematopoietic stem cell transplant should receive a  3-dose series 6-12 months after her successful transplant. Hib vaccine is not recommended for adults with HIV infection. Preventive Services / Frequency Ages 35 to 4 years  Blood pressure check.** / Every 3-5 years.  Lipid and cholesterol check.** / Every 5 years beginning at age 60.  Clinical breast exam.** / Every 3 years for women in their 71s and 10s.  BRCA-related cancer risk assessment.** / For women who have family members with a BRCA-related cancer (breast, ovarian, tubal, or peritoneal cancers).  Pap test.** / Every 2 years from ages 76 through 26. Every 3 years starting at age 61 through age 76 or 93 with a history of 3 consecutive normal Pap tests.  HPV screening.** / Every 3 years from ages 37 through ages 60 to 51 with a history of 3 consecutive normal Pap tests.  Hepatitis C blood test.** / For any individual with known risks for hepatitis C.  Skin self-exam. / Monthly.  Influenza vaccine. / Every year.  Tetanus, diphtheria, and acellular pertussis (Tdap, Td) vaccine.** / Consult your health care provider. Pregnant women should receive 1 dose of Tdap vaccine during each pregnancy. 1 dose of Td every 10 years.  Varicella vaccine.** / Consult your health care provider. Pregnant females who do not have evidence of immunity should receive the first dose after pregnancy.  HPV vaccine. / 3 doses over 6 months, if 93 and younger. The vaccine is not recommended for use in pregnant females. However, pregnancy testing is not needed before receiving a dose.  Measles, mumps, rubella (MMR) vaccine.** / You need at least 1 dose of MMR if you were born in 1957 or later. You may also need a 2nd dose. For females of childbearing age, rubella immunity should be determined. If there is no evidence of immunity, females who are not pregnant should be vaccinated. If there is no evidence of immunity, females who are  pregnant should delay immunization until after pregnancy.  Pneumococcal  13-valent conjugate (PCV13) vaccine.** / Consult your health care provider.  Pneumococcal polysaccharide (PPSV23) vaccine.** / 1 to 2 doses if you smoke cigarettes or if you have certain conditions.  Meningococcal vaccine.** / 1 dose if you are age 68 to 8 years and a Market researcher living in a residence hall, or have one of several medical conditions, you need to get vaccinated against meningococcal disease. You may also need additional booster doses.  Hepatitis A vaccine.** / Consult your health care provider.  Hepatitis B vaccine.** / Consult your health care provider.  Haemophilus influenzae type b (Hib) vaccine.** / Consult your health care provider. Ages 7 to 53 years  Blood pressure check.** / Every year.  Lipid and cholesterol check.** / Every 5 years beginning at age 25 years.  Lung cancer screening. / Every year if you are aged 11-80 years and have a 30-pack-year history of smoking and currently smoke or have quit within the past 15 years. Yearly screening is stopped once you have quit smoking for at least 15 years or develop a health problem that would prevent you from having lung cancer treatment.  Clinical breast exam.** / Every year after age 48 years.  BRCA-related cancer risk assessment.** / For women who have family members with a BRCA-related cancer (breast, ovarian, tubal, or peritoneal cancers).  Mammogram.** / Every year beginning at age 41 years and continuing for as long as you are in good health. Consult with your health care provider.  Pap test.** / Every 3 years starting at age 65 years through age 37 or 70 years with a history of 3 consecutive normal Pap tests.  HPV screening.** / Every 3 years from ages 72 years through ages 60 to 40 years with a history of 3 consecutive normal Pap tests.  Fecal occult blood test (FOBT) of stool. / Every year beginning at age 21 years and continuing until age 5 years. You may not need to do this test if you get  a colonoscopy every 10 years.  Flexible sigmoidoscopy or colonoscopy.** / Every 5 years for a flexible sigmoidoscopy or every 10 years for a colonoscopy beginning at age 35 years and continuing until age 48 years.  Hepatitis C blood test.** / For all people born from [birth year] through 1965 and any individual with known risks for hepatitis C.  Skin self-exam. / Monthly.  Influenza vaccine. / Every year.  Tetanus, diphtheria, and acellular pertussis (Tdap/Td) vaccine.** / Consult your health care provider. Pregnant women should receive 1 dose of Tdap vaccine during each pregnancy. 1 dose of Td every 10 years.  Varicella vaccine.** / Consult your health care provider. Pregnant females who do not have evidence of immunity should receive the first dose after pregnancy.  Zoster vaccine.** / 1 dose for adults aged 30 years or older.  Measles, mumps, rubella (MMR) vaccine.** / You need at least 1 dose of MMR if you were born in 1957 or later. You may also need a second dose. For females of childbearing age, rubella immunity should be determined. If there is no evidence of immunity, females who are not pregnant should be vaccinated. If there is no evidence of immunity, females who are pregnant should delay immunization until after pregnancy.  Pneumococcal 13-valent conjugate (PCV13) vaccine.** / Consult your health care provider.  Pneumococcal polysaccharide (PPSV23) vaccine.** / 1 to 2 doses if you smoke cigarettes or if you have certain conditions.  Meningococcal vaccine.** /  Consult your health care provider.  Hepatitis A vaccine.** / Consult your health care provider.  Hepatitis B vaccine.** / Consult your health care provider.  Haemophilus influenzae type b (Hib) vaccine.** / Consult your health care provider. Ages 64 years and over  Blood pressure check.** / Every year.  Lipid and cholesterol check.** / Every 5 years beginning at age 23 years.  Lung cancer screening. / Every year if you  are aged 16-80 years and have a 30-pack-year history of smoking and currently smoke or have quit within the past 15 years. Yearly screening is stopped once you have quit smoking for at least 15 years or develop a health problem that would prevent you from having lung cancer treatment.  Clinical breast exam.** / Every year after age 74 years.  BRCA-related cancer risk assessment.** / For women who have family members with a BRCA-related cancer (breast, ovarian, tubal, or peritoneal cancers).  Mammogram.** / Every year beginning at age 44 years and continuing for as long as you are in good health. Consult with your health care provider.  Pap test.** / Every 3 years starting at age 58 years through age 22 or 39 years with 3 consecutive normal Pap tests. Testing can be stopped between 65 and 70 years with 3 consecutive normal Pap tests and no abnormal Pap or HPV tests in the past 10 years.  HPV screening.** / Every 3 years from ages 64 years through ages 70 or 61 years with a history of 3 consecutive normal Pap tests. Testing can be stopped between 65 and 70 years with 3 consecutive normal Pap tests and no abnormal Pap or HPV tests in the past 10 years.  Fecal occult blood test (FOBT) of stool. / Every year beginning at age 40 years and continuing until age 27 years. You may not need to do this test if you get a colonoscopy every 10 years.  Flexible sigmoidoscopy or colonoscopy.** / Every 5 years for a flexible sigmoidoscopy or every 10 years for a colonoscopy beginning at age 7 years and continuing until age 32 years.  Hepatitis C blood test.** / For all people born from [birth year] through 1963 and any individual with known risks for hepatitis C.  Osteoporosis screening.** / A one-time screening for women ages 30 years and over and women at risk for fractures or osteoporosis.  Skin self-exam. / Monthly.  Influenza vaccine. / Every year.  Tetanus, diphtheria, and acellular pertussis (Tdap/Td)  vaccine.** / 1 dose of Td every 10 years.  Varicella vaccine.** / Consult your health care provider.  Zoster vaccine.** / 1 dose for adults aged 35 years or older.  Pneumococcal 13-valent conjugate (PCV13) vaccine.** / Consult your health care provider.  Pneumococcal polysaccharide (PPSV23) vaccine.** / 1 dose for all adults aged 46 years and older.  Meningococcal vaccine.** / Consult your health care provider.  Hepatitis A vaccine.** / Consult your health care provider.  Hepatitis B vaccine.** / Consult your health care provider.  Haemophilus influenzae type b (Hib) vaccine.** / Consult your health care provider. ** Family history and personal history of risk and conditions may change your health care provider's recommendations.   This information is not intended to replace advice given to you by your health care provider. Make sure you discuss any questions you have with your health care provider.   Document Released: 10/05/2001 Document Revised: 08/30/2014 Document Reviewed: 01/04/2011 Elsevier Interactive Patient Education [employer].

## 2016-02-13 NOTE — Assessment & Plan Note (Signed)
on Zantac, off Prilosec due to diarrhea

## 2016-02-13 NOTE — Progress Notes (Signed)
Subjective:  Patient ID: Jasmin [name], female    DOB: 1934/01/11  Age: 82 y.o. MRN: [id number]  CC: No chief complaint on file.   HPI Jasmin [name] presents for a well exam  Outpatient Prescriptions Prior to Visit  Medication Sig Dispense Refill  . Cholecalciferol 1000 units tablet Take 2 tablets (2,000 Units total) by mouth daily. 100 tablet 5  . lovastatin (MEVACOR) 40 MG tablet TAKE 1 TABLET AT BEDTIME 90 tablet 3  . metoprolol (LOPRESSOR) 50 MG tablet TAKE 1 TABLET BY MOUTH TWICE A DAY 180 tablet 3  . mupirocin ointment (BACTROBAN) 2 % Use bid on the wound 30 g 0  . polyethylene glycol (GLYCOLAX) powder Take 17 g by mouth daily as needed (constipation). Reported on 12/10/2015    . triamcinolone ointment (KENALOG) 0.5 % Apply topically 2 (two) times daily. 30 g 0  . triamterene-hydrochlorothiazide (MAXZIDE-25) 37.5-25 MG tablet Take 1 tablet by mouth daily. 90 tablet 3  . vitamin B-12 (CYANOCOBALAMIN) 1000 MCG tablet Take 500 mcg by mouth daily.      . hydrocortisone (ANUSOL-HC) 25 MG suppository Place 1 suppository (25 mg total) rectally 2 (two) times daily. Reported on 10/06/2015 (Patient not taking: Reported on 02/13/2016) 12 suppository 0  . omeprazole (PRILOSEC) 20 MG capsule TAKE 1 CAPSULE (20 MG TOTAL) BY MOUTH DAILY. (Patient not taking: Reported on 02/13/2016) 90 capsule 3   No facility-administered medications prior to visit.    ROS Review of Systems  Constitutional: Negative for chills, activity change, appetite change, fatigue and unexpected weight change.  HENT: Negative for congestion, mouth sores and sinus pressure.   Eyes: Negative for visual disturbance.  Respiratory: Negative for cough and chest tightness.   Gastrointestinal: Negative for nausea and abdominal pain.  Genitourinary: Negative for frequency, difficulty urinating and vaginal pain.  Musculoskeletal: Negative for back pain and gait problem.  Skin: Negative for pallor and rash.    Neurological: Negative for dizziness, tremors, weakness, numbness and headaches.  Psychiatric/Behavioral: Negative for confusion and sleep disturbance.    Objective:  BP 140/80 mmHg  Pulse 66  Ht 5' 6.5" (1.689 m)  Wt 224 lb (101.606 kg)  BMI 35.62 kg/m2  SpO2 95%  BP Readings from Last 3 Encounters:  02/13/16 140/80  12/10/15 124/64  11/28/15 130/70    Wt Readings from Last 3 Encounters:  02/13/16 224 lb (101.606 kg)  12/10/15 224 lb (101.606 kg)  11/28/15 222 lb 12 oz (101.039 kg)    Physical Exam  Constitutional: She appears well-developed. No distress.  HENT:  Head: Normocephalic.  Right Ear: External ear normal.  Left Ear: External ear normal.  Nose: Nose normal.  Mouth/Throat: Oropharynx is clear and moist.  Eyes: Conjunctivae are normal. Pupils are equal, round, and reactive to light. Right eye exhibits no discharge. Left eye exhibits no discharge.  Neck: Normal range of motion. Neck supple. No JVD present. No tracheal deviation present. No thyromegaly present.  Cardiovascular: Normal rate, regular rhythm and normal heart sounds.   Pulmonary/Chest: No stridor. No respiratory distress. She has no wheezes.  Abdominal: Soft. Bowel sounds are normal. She exhibits no distension and no mass. There is no tenderness. There is no rebound and no guarding.  Musculoskeletal: She exhibits no edema or tenderness.  Lymphadenopathy:    She has no cervical adenopathy.  Neurological: She displays normal reflexes. No cranial nerve deficit. She exhibits normal muscle tone. Coordination normal.  Skin: No rash noted. No erythema.  Psychiatric: She has a normal  mood and affect. Her behavior is normal. Judgment and thought content normal.  Obese  Lab Results  Component Value Date   WBC 3.6* 01/27/2016   HGB 14.1 01/27/2016   HCT 41.5 01/27/2016   PLT 120.0* 01/27/2016   GLUCOSE 97 01/27/2016   CHOL 169 01/27/2016   TRIG 112.0 01/27/2016   HDL 55.30 01/27/2016   LDLDIRECT 139.3  05/31/2012   LDLCALC 91 01/27/2016   ALT 15 01/27/2016   AST 16 01/27/2016   NA 140 01/27/2016   K 3.5 01/27/2016   CL 102 01/27/2016   CREATININE 1.17 01/27/2016   BUN 22 01/27/2016   CO2 29 01/27/2016   TSH 2.20 01/27/2016   INR 1.16 08/16/2009    Mm Digital Screening Bilateral  03/19/2015  CLINICAL DATA:  Screening. EXAM: DIGITAL SCREENING BILATERAL MAMMOGRAM WITH CAD COMPARISON:  Previous exam(s). ACR Breast Density Category b: There are scattered areas of fibroglandular density. FINDINGS: There are no findings suspicious for malignancy. Images were processed with CAD. IMPRESSION: No mammographic evidence of malignancy. A result letter of this screening mammogram will be mailed directly to the patient. RECOMMENDATION: Screening mammogram in one year. (Code:SM-B-01Y) BI-RADS CATEGORY  1: Negative. Electronically Signed   By: Lillia Mountain M.D.   On: 03/19/2015 15:04    Assessment & Plan:   There are no diagnoses linked to this encounter. I am having Jasmin [name] maintain her polyethylene glycol powder, vitamin B-12, mupirocin ointment, triamcinolone ointment, metoprolol, lovastatin, omeprazole, triamterene-hydrochlorothiazide, Cholecalciferol, hydrocortisone, gentamicin, ketorolac, and prednisoLONE acetate.  Meds ordered this encounter  Medications  . gentamicin (GARAMYCIN) 0.3 % ophthalmic solution    Sig: STARTING THE DAY OF SURGERY INSTILL 1 DROP IN THE RIGHT EYE 4 TIMES A DAY FOR 1 WEEK THEN STOP    Refill:  0  . ketorolac (ACULAR) 0.5 % ophthalmic solution    Sig: STARTING THE DAY OF SURGERY INSTILL 1 DROP 4XDAY FOR 2 WEEKS INTO RIGHT EYE THE D/C    Refill:  0  . prednisoLONE acetate (PRED FORTE) 1 % ophthalmic suspension    Sig: STARTING DAY OF SURGERY: 1 DROP 4X A DAY X1 WEEK, 1 DROP TWICE DAILY X1 WEEK IN RIGHT EYE THEN D/C    Refill:  0     Follow-up: No Follow-up on file.  Walker Kehr, MD

## 2016-02-13 NOTE — Assessment & Plan Note (Signed)
Resolved  off Prilosec

## 2016-02-13 NOTE — Assessment & Plan Note (Signed)
Metoprolol, Maxzide 

## 2016-02-13 NOTE — Assessment & Plan Note (Signed)
The patient is here for annual Medicare wellness examination and management of other chronic and acute problems.   The risk factors are reflected in the social history.  The roster of all physicians providing medical care to patient - is listed in the Snapshot section of the chart.  Activities of daily living:  The patient is 100% inedpendent in all ADLs: dressing, toileting, feeding as well as independent mobility  Home safety : The patient has smoke detectors in the home. They wear seatbelts. There is no violence in the home.   There is no risks for hepatitis, STDs or HIV. There is no   history of blood transfusion. They have no travel history to infectious disease endemic areas of the world.  The patient has  seen their dentist in the last 12 month. They have  seen their eye doctor in the last year. They deny  Any major hearing difficulty and have not had audiologic testing in the last year.  They do not  have excessive sun exposure. Discussed the need for sun protection: hats, long sleeves and use of sunscreen if there is significant sun exposure.   Diet: the importance of a healthy diet is discussed. They do have a reasonably healthy  diet.  The patient has a fairly regular exercise program of a mixed nature: walking, yard work, etc.The benefits of regular aerobic exercise were discussed - in [employer] program.  Depression screen: there are no signs or vegative symptoms of depression- irritability, change in appetite, anhedonia, sadness/tearfullness.  Cognitive assessment: the patient manages all their financial and personal affairs and is actively engaged. They could relate day,date,year and events; recalled 3/3 objects at 3 minutes  The following portions of the patient's history were reviewed and updated as appropriate: allergies, current medications, past family history, past medical history,  past surgical history, past social history  and problem list.  Vision, hearing, body  mass index were assessed and reviewed.   During the course of the visit the patient was educated and counseled about appropriate screening and preventive services including : fall prevention , diabetes screening, nutrition counseling, colorectal cancer screening, and recommended immunizations.

## 2016-04-16 ENCOUNTER — Ambulatory Visit (INDEPENDENT_AMBULATORY_CARE_PROVIDER_SITE_OTHER): Payer: Commercial Managed Care - HMO | Admitting: *Deleted

## 2016-04-16 DIAGNOSIS — Z23 Encounter for immunization: Secondary | ICD-10-CM

## 2016-07-12 ENCOUNTER — Other Ambulatory Visit: Payer: Self-pay | Admitting: Internal Medicine

## 2016-08-13 ENCOUNTER — Encounter: Payer: Self-pay | Admitting: Internal Medicine

## 2016-08-13 ENCOUNTER — Other Ambulatory Visit (INDEPENDENT_AMBULATORY_CARE_PROVIDER_SITE_OTHER): Payer: Commercial Managed Care - HMO

## 2016-08-13 ENCOUNTER — Ambulatory Visit (INDEPENDENT_AMBULATORY_CARE_PROVIDER_SITE_OTHER): Payer: Commercial Managed Care - HMO | Admitting: Internal Medicine

## 2016-08-13 DIAGNOSIS — D72819 Decreased white blood cell count, unspecified: Secondary | ICD-10-CM

## 2016-08-13 DIAGNOSIS — E538 Deficiency of other specified B group vitamins: Secondary | ICD-10-CM

## 2016-08-13 DIAGNOSIS — E6609 Other obesity due to excess calories: Secondary | ICD-10-CM

## 2016-08-13 DIAGNOSIS — Z86010 Personal history of colonic polyps: Secondary | ICD-10-CM | POA: Insufficient documentation

## 2016-08-13 DIAGNOSIS — Z6835 Body mass index (BMI) 35.0-35.9, adult: Secondary | ICD-10-CM

## 2016-08-13 DIAGNOSIS — I10 Essential (primary) hypertension: Secondary | ICD-10-CM

## 2016-08-13 DIAGNOSIS — R197 Diarrhea, unspecified: Secondary | ICD-10-CM

## 2016-08-13 DIAGNOSIS — IMO0001 Reserved for inherently not codable concepts without codable children: Secondary | ICD-10-CM

## 2016-08-13 LAB — CBC WITH DIFFERENTIAL/PLATELET
BASOS PCT: 0.6 % (ref 0.0–3.0)
Basophils Absolute: 0 10*3/uL (ref 0.0–0.1)
EOS PCT: 0.6 % (ref 0.0–5.0)
Eosinophils Absolute: 0 10*3/uL (ref 0.0–0.7)
HEMATOCRIT: 41.8 % (ref 36.0–46.0)
HEMOGLOBIN: 14.4 g/dL (ref 12.0–15.0)
LYMPHS PCT: 53 % — AB (ref 12.0–46.0)
Lymphs Abs: 1.6 10*3/uL (ref 0.7–4.0)
MCHC: 34.4 g/dL (ref 30.0–36.0)
MCV: 90.8 fl (ref 78.0–100.0)
Monocytes Absolute: 0.4 10*3/uL (ref 0.1–1.0)
Monocytes Relative: 12.9 % — ABNORMAL HIGH (ref 3.0–12.0)
Neutro Abs: 1 10*3/uL — ABNORMAL LOW (ref 1.4–7.7)
Neutrophils Relative %: 32.9 % — ABNORMAL LOW (ref 43.0–77.0)
Platelets: 110 10*3/uL — ABNORMAL LOW (ref 150.0–400.0)
RBC: 4.6 Mil/uL (ref 3.87–5.11)
RDW: 14.5 % (ref 11.5–15.5)
WBC: 3.1 10*3/uL — AB (ref 4.0–10.5)

## 2016-08-13 LAB — BASIC METABOLIC PANEL
BUN: 20 mg/dL (ref 6–23)
CALCIUM: 9.6 mg/dL (ref 8.4–10.5)
CO2: 30 mEq/L (ref 19–32)
Chloride: 102 mEq/L (ref 96–112)
Creatinine, Ser: 1.19 mg/dL (ref 0.40–1.20)
GFR: 56.03 mL/min — AB (ref >60.00)
GLUCOSE: 91 mg/dL (ref 70–99)
Potassium: 4.1 mEq/L (ref 3.5–5.1)
Sodium: 139 mEq/L (ref 135–145)

## 2016-08-13 NOTE — Progress Notes (Signed)
Pre visit review using our clinic review tool, if applicable. No additional management support is needed unless otherwise documented below in the visit note. 

## 2016-08-13 NOTE — Assessment & Plan Note (Signed)
Dr Henrene Pastor Colon due in 2018

## 2016-08-13 NOTE — Progress Notes (Signed)
Subjective:  Patient ID: Jasmin [name], female    DOB: 1933-10-02  Age: 82 y.o. MRN: [id number]  CC: No chief complaint on file.   HPI [name] presents for GERD, dyslipidemia, B12 def f/u. C/o oc diarrhea  Outpatient Medications Prior to Visit  Medication Sig Dispense Refill  . Cholecalciferol 1000 units tablet Take 2 tablets (2,000 Units total) by mouth daily. 100 tablet 5  . gentamicin (GARAMYCIN) 0.3 % ophthalmic solution STARTING THE DAY OF SURGERY INSTILL 1 DROP IN THE RIGHT EYE 4 TIMES A DAY FOR 1 WEEK THEN STOP  0  . hydrocortisone (ANUSOL-HC) 25 MG suppository Place 1 suppository (25 mg total) rectally 2 (two) times daily. Reported on 10/06/2015 12 suppository 0  . ketorolac (ACULAR) 0.5 % ophthalmic solution STARTING THE DAY OF SURGERY INSTILL 1 DROP 4XDAY FOR 2 WEEKS INTO RIGHT EYE THE D/C  0  . lovastatin (MEVACOR) 40 MG tablet TAKE 1 TABLET AT BEDTIME 90 tablet 3  . metoprolol (LOPRESSOR) 50 MG tablet TAKE 1 TABLET TWICE DAILY 180 tablet 3  . mupirocin ointment (BACTROBAN) 2 % Use bid on the wound 30 g 0  . polyethylene glycol (GLYCOLAX) powder Take 17 g by mouth daily as needed (constipation). Reported on 12/10/2015    . prednisoLONE acetate (PRED FORTE) 1 % ophthalmic suspension STARTING DAY OF SURGERY: 1 DROP 4X A DAY X1 WEEK, 1 DROP TWICE DAILY X1 WEEK IN RIGHT EYE THEN D/C  0  . triamcinolone ointment (KENALOG) 0.5 % Apply topically 2 (two) times daily. 30 g 0  . triamterene-hydrochlorothiazide (MAXZIDE-25) 37.5-25 MG tablet Take 1 tablet by mouth daily. 90 tablet 3  . vitamin B-12 (CYANOCOBALAMIN) 1000 MCG tablet Take 500 mcg by mouth daily.       No facility-administered medications prior to visit.     ROS Review of Systems  Constitutional: Negative for activity change, appetite change, chills, fatigue and unexpected weight change.  HENT: Negative for congestion, mouth sores and sinus pressure.   Eyes: Negative for visual disturbance.  Respiratory:  Negative for cough and chest tightness.   Gastrointestinal: Positive for diarrhea. Negative for abdominal pain and nausea.  Genitourinary: Negative for difficulty urinating, frequency and vaginal pain.  Musculoskeletal: Negative for back pain and gait problem.  Skin: Negative for pallor and rash.  Neurological: Negative for dizziness, tremors, weakness, numbness and headaches.  Psychiatric/Behavioral: Negative for confusion and sleep disturbance.    Objective:  BP 136/72   Pulse 65   Temp 98 F (36.7 C) (Oral)   Resp 20   Wt 224 lb (101.6 kg)   SpO2 94%   BMI 35.61 kg/m   BP Readings from Last 3 Encounters:  08/13/16 136/72  02/13/16 140/80  12/10/15 124/64    Wt Readings from Last 3 Encounters:  08/13/16 224 lb (101.6 kg)  02/13/16 224 lb (101.6 kg)  12/10/15 224 lb (101.6 kg)    Physical Exam  Constitutional: She appears well-developed. No distress.  HENT:  Head: Normocephalic.  Right Ear: External ear normal.  Left Ear: External ear normal.  Nose: Nose normal.  Mouth/Throat: Oropharynx is clear and moist.  Eyes: Conjunctivae are normal. Pupils are equal, round, and reactive to light. Right eye exhibits no discharge. Left eye exhibits no discharge.  Neck: Normal range of motion. Neck supple. No JVD present. No tracheal deviation present. No thyromegaly present.  Cardiovascular: Normal rate, regular rhythm and normal heart sounds.   Pulmonary/Chest: No stridor. No respiratory distress. She has no  wheezes.  Abdominal: Soft. Bowel sounds are normal. She exhibits no distension and no mass. There is no tenderness. There is no rebound and no guarding.  Musculoskeletal: She exhibits no edema or tenderness.  Lymphadenopathy:    She has no cervical adenopathy.  Neurological: She displays normal reflexes. No cranial nerve deficit. She exhibits normal muscle tone. Coordination normal.  Skin: No rash noted. No erythema.  Psychiatric: She has a normal mood and affect. Her  behavior is normal. Judgment and thought content normal.  Obese  Lab Results  Component Value Date   WBC 3.6 (L) 01/27/2016   HGB 14.1 01/27/2016   HCT 41.5 01/27/2016   PLT 120.0 (L) 01/27/2016   GLUCOSE 97 01/27/2016   CHOL 169 01/27/2016   TRIG 112.0 01/27/2016   HDL 55.30 01/27/2016   LDLDIRECT 139.3 05/31/2012   LDLCALC 91 01/27/2016   ALT 15 01/27/2016   AST 16 01/27/2016   NA 140 01/27/2016   K 3.5 01/27/2016   CL 102 01/27/2016   CREATININE 1.17 01/27/2016   BUN 22 01/27/2016   CO2 29 01/27/2016   TSH 2.20 01/27/2016   INR 1.16 08/16/2009    Mm Digital Screening Bilateral  Result Date: 03/19/2015 CLINICAL DATA:  Screening. EXAM: DIGITAL SCREENING BILATERAL MAMMOGRAM WITH CAD COMPARISON:  Previous exam(s). ACR Breast Density Category b: There are scattered areas of fibroglandular density. FINDINGS: There are no findings suspicious for malignancy. Images were processed with CAD. IMPRESSION: No mammographic evidence of malignancy. A result letter of this screening mammogram will be mailed directly to the patient. RECOMMENDATION: Screening mammogram in one year. (Code:SM-B-01Y) BI-RADS CATEGORY  1: Negative. Electronically Signed   By: Lillia Mountain M.D.   On: 03/19/2015 15:04    Assessment & Plan:   There are no diagnoses linked to this encounter. I am having Ms. Tirrell maintain her polyethylene glycol powder, vitamin B-12, mupirocin ointment, triamcinolone ointment, triamterene-hydrochlorothiazide, Cholecalciferol, hydrocortisone, gentamicin, ketorolac, prednisoLONE acetate, metoprolol, and lovastatin.  No orders of the defined types were placed in this encounter.    Follow-up: No Follow-up on file.  Walker Kehr, MD

## 2016-08-13 NOTE — Assessment & Plan Note (Signed)
Off and on Colon due in 2018

## 2016-08-13 NOTE — Assessment & Plan Note (Signed)
Labs

## 2016-08-13 NOTE — Assessment & Plan Note (Signed)
On B12 

## 2016-08-13 NOTE — Assessment & Plan Note (Signed)
Reduce calories

## 2016-08-13 NOTE — Assessment & Plan Note (Signed)
Toprol, Maxzide 

## 2016-09-10 ENCOUNTER — Telehealth: Payer: Self-pay | Admitting: Internal Medicine

## 2016-09-10 NOTE — Telephone Encounter (Signed)
° ° ° °  [facility] [place]  Patient Name: Jasmin [name]  DOB: Jan 19, 1934    Initial Comment caller states she has runny nose   Nurse Assessment  Nurse: Wynetta Emery, RN, Baker Janus Date/Time Eilene Ghazi Time): 09/10/2016 9:24:31 AM  Confirm and document reason for call. If symptomatic, describe symptoms. ---Cruz Condon has runny nose and cough onset for runny nose last week. no fever present.  Does the patient have any new or worsening symptoms? ---Yes  Will a triage be completed? ---Yes  Related visit to physician within the last 2 weeks? ---No  Does the PT have any chronic conditions? (i.e. diabetes, asthma, etc.) ---Yes  List chronic conditions. ---Acid Reflux  Is this a behavioral health or substance abuse call? ---No     Guidelines    Guideline Title Affirmed Question Affirmed Notes  Cough - Acute Non-Productive [1] Patient also has allergy symptoms (e.g., itchy eyes, clear nasal discharge, postnasal drip) AND [2] they are acting up    Final Disposition User   See PCP When Office is Open (within 3 days) Wynetta Emery, RN, Baker Janus    Comments  NOTE has appt scheduled on Monday 1/22-2018 at 4pm if there is a cancellation today will you notify her and she will come in today   Referrals  REFERRED TO PCP OFFICE   Disagree/Comply: Comply

## 2016-09-13 ENCOUNTER — Encounter: Payer: Self-pay | Admitting: Internal Medicine

## 2016-09-13 ENCOUNTER — Ambulatory Visit (INDEPENDENT_AMBULATORY_CARE_PROVIDER_SITE_OTHER): Payer: Medicare Other | Admitting: Internal Medicine

## 2016-09-13 DIAGNOSIS — K219 Gastro-esophageal reflux disease without esophagitis: Secondary | ICD-10-CM

## 2016-09-13 DIAGNOSIS — K449 Diaphragmatic hernia without obstruction or gangrene: Secondary | ICD-10-CM

## 2016-09-13 DIAGNOSIS — J069 Acute upper respiratory infection, unspecified: Secondary | ICD-10-CM | POA: Insufficient documentation

## 2016-09-13 MED ORDER — RANITIDINE HCL 150 MG PO TABS: 150.0000 mg | ORAL_TABLET | Freq: Two times a day (BID) | ORAL | 3 refills | Status: DC

## 2016-09-13 MED ORDER — DOXYCYCLINE HYCLATE 100 MG PO TABS: 100.0000 mg | ORAL_TABLET | Freq: Two times a day (BID) | ORAL | 1 refills | Status: DC

## 2016-09-13 NOTE — Progress Notes (Signed)
Subjective:  Patient ID: Jasmin [name], female    DOB: 1934/07/14  Age: 83 y.o. MRN: [id number]  CC: No chief complaint on file.   HPI Jasmin [name] presents for GERD C/o URI x 2 weeks  Outpatient Medications Prior to Visit  Medication Sig Dispense Refill  . Cholecalciferol 1000 units tablet Take 2 tablets (2,000 Units total) by mouth daily. 100 tablet 5  . gentamicin (GARAMYCIN) 0.3 % ophthalmic solution STARTING THE DAY OF SURGERY INSTILL 1 DROP IN THE RIGHT EYE 4 TIMES A DAY FOR 1 WEEK THEN STOP  0  . hydrocortisone (ANUSOL-HC) 25 MG suppository Place 1 suppository (25 mg total) rectally 2 (two) times daily. Reported on 10/06/2015 12 suppository 0  . ketorolac (ACULAR) 0.5 % ophthalmic solution STARTING THE DAY OF SURGERY INSTILL 1 DROP 4XDAY FOR 2 WEEKS INTO RIGHT EYE THE D/C  0  . lovastatin (MEVACOR) 40 MG tablet TAKE 1 TABLET AT BEDTIME 90 tablet 3  . metoprolol (LOPRESSOR) 50 MG tablet TAKE 1 TABLET TWICE DAILY 180 tablet 3  . mupirocin ointment (BACTROBAN) 2 % Use bid on the wound 30 g 0  . polyethylene glycol (GLYCOLAX) powder Take 17 g by mouth daily as needed (constipation). Reported on 12/10/2015    . prednisoLONE acetate (PRED FORTE) 1 % ophthalmic suspension STARTING DAY OF SURGERY: 1 DROP 4X A DAY X1 WEEK, 1 DROP TWICE DAILY X1 WEEK IN RIGHT EYE THEN D/C  0  . triamcinolone ointment (KENALOG) 0.5 % Apply topically 2 (two) times daily. 30 g 0  . triamterene-hydrochlorothiazide (MAXZIDE-25) 37.5-25 MG tablet Take 1 tablet by mouth daily. 90 tablet 3  . vitamin B-12 (CYANOCOBALAMIN) 1000 MCG tablet Take 500 mcg by mouth daily.       No facility-administered medications prior to visit.     ROS Review of Systems  Constitutional: Negative for activity change, appetite change, chills, fatigue and unexpected weight change.  HENT: Positive for congestion, sinus pain and sore throat. Negative for mouth sores and sinus pressure.   Eyes: Negative for visual  disturbance.  Respiratory: Positive for cough. Negative for chest tightness.   Gastrointestinal: Negative for abdominal pain and nausea.  Genitourinary: Negative for difficulty urinating, frequency and vaginal pain.  Musculoskeletal: Negative for back pain and gait problem.  Skin: Negative for pallor and rash.  Neurological: Negative for dizziness, tremors, weakness, numbness and headaches.  Psychiatric/Behavioral: Negative for confusion and sleep disturbance.    Objective:  BP 130/68   Pulse 75   Temp 99.8 F (37.7 C) (Oral)   Wt 218 lb (98.9 kg)   SpO2 93%   BMI 34.66 kg/m   BP Readings from Last 3 Encounters:  09/13/16 130/68  08/13/16 136/72  02/13/16 140/80    Wt Readings from Last 3 Encounters:  09/13/16 218 lb (98.9 kg)  08/13/16 224 lb (101.6 kg)  02/13/16 224 lb (101.6 kg)    Physical Exam  Constitutional: She appears well-developed. No distress.  HENT:  Head: Normocephalic.  Right Ear: External ear normal.  Left Ear: External ear normal.  Nose: Nose normal.  Mouth/Throat: Oropharynx is clear and moist.  Eyes: Conjunctivae are normal. Pupils are equal, round, and reactive to light. Right eye exhibits no discharge. Left eye exhibits no discharge.  Neck: Normal range of motion. Neck supple. No JVD present. No tracheal deviation present. No thyromegaly present.  Cardiovascular: Normal rate, regular rhythm and normal heart sounds.   Pulmonary/Chest: No stridor. No respiratory distress. She has no wheezes.  Abdominal: Soft. Bowel sounds are normal. She exhibits no distension and no mass. There is no tenderness. There is no rebound and no guarding.  Musculoskeletal: She exhibits no edema or tenderness.  Lymphadenopathy:    She has no cervical adenopathy.  Neurological: She displays normal reflexes. No cranial nerve deficit. She exhibits normal muscle tone. Coordination normal.  Skin: No rash noted. No erythema.  Psychiatric: She has a normal mood and affect. Her  behavior is normal. Judgment and thought content normal.  eryth throat  Lab Results  Component Value Date   WBC 3.1 (L) 08/13/2016   HGB 14.4 08/13/2016   HCT 41.8 08/13/2016   PLT 110.0 (L) 08/13/2016   GLUCOSE 91 08/13/2016   CHOL 169 01/27/2016   TRIG 112.0 01/27/2016   HDL 55.30 01/27/2016   LDLDIRECT 139.3 05/31/2012   LDLCALC 91 01/27/2016   ALT 15 01/27/2016   AST 16 01/27/2016   NA 139 08/13/2016   K 4.1 08/13/2016   CL 102 08/13/2016   CREATININE 1.19 08/13/2016   BUN 20 08/13/2016   CO2 30 08/13/2016   TSH 2.20 01/27/2016   INR 1.16 08/16/2009    Mm Digital Screening Bilateral  Result Date: 03/19/2015 CLINICAL DATA:  Screening. EXAM: DIGITAL SCREENING BILATERAL MAMMOGRAM WITH CAD COMPARISON:  Previous exam(s). ACR Breast Density Category b: There are scattered areas of fibroglandular density. FINDINGS: There are no findings suspicious for malignancy. Images were processed with CAD. IMPRESSION: No mammographic evidence of malignancy. A result letter of this screening mammogram will be mailed directly to the patient. RECOMMENDATION: Screening mammogram in one year. (Code:SM-B-01Y) BI-RADS CATEGORY  1: Negative. Electronically Signed   By: Lillia Mountain M.D.   On: 03/19/2015 15:04    Assessment & Plan:   There are no diagnoses linked to this encounter. I am having Ms. Aguilera maintain her polyethylene glycol powder, vitamin B-12, mupirocin ointment, triamcinolone ointment, triamterene-hydrochlorothiazide, Cholecalciferol, hydrocortisone, gentamicin, ketorolac, prednisoLONE acetate, metoprolol, and lovastatin.  No orders of the defined types were placed in this encounter.    Follow-up: No Follow-up on file.  Walker Kehr, MD

## 2016-09-13 NOTE — Assessment & Plan Note (Signed)
Doxy x 7 d 

## 2016-09-13 NOTE — Patient Instructions (Signed)
Use over-the-counter  "cold" medicines  such as "Tylenol" or "Advil" for fever, chills and achyness. Use Halls or Ricola cough drops.  Please, make an appointment if you are not better or if you're worse.

## 2016-09-13 NOTE — Progress Notes (Signed)
Pre visit review using our clinic review tool, if applicable. No additional management support is needed unless otherwise documented below in the visit note. 

## 2016-09-13 NOTE — Assessment & Plan Note (Signed)
Zantac bid

## 2016-09-17 NOTE — Telephone Encounter (Signed)
Nausea w/Doxy - ok to d/c Feeling better otherwise

## 2016-11-30 ENCOUNTER — Other Ambulatory Visit: Payer: Self-pay | Admitting: Internal Medicine

## 2016-11-30 MED ORDER — LOVASTATIN 40 MG PO TABS: 40.0000 mg | ORAL_TABLET | Freq: Every day | ORAL | 2 refills | Status: DC

## 2016-11-30 NOTE — Addendum Note (Signed)
Addended by: Earnstine Regal on: 11/30/2016 01:14 PM   Modules accepted: Orders

## 2016-12-06 ENCOUNTER — Encounter: Payer: Self-pay | Admitting: Internal Medicine

## 2016-12-20 DIAGNOSIS — Z01419 Encounter for gynecological examination (general) (routine) without abnormal findings: Secondary | ICD-10-CM | POA: Diagnosis not present

## 2016-12-21 ENCOUNTER — Other Ambulatory Visit (INDEPENDENT_AMBULATORY_CARE_PROVIDER_SITE_OTHER): Payer: Medicare Other

## 2016-12-21 ENCOUNTER — Ambulatory Visit (INDEPENDENT_AMBULATORY_CARE_PROVIDER_SITE_OTHER)
Admission: RE | Admit: 2016-12-21 | Discharge: 2016-12-21 | Disposition: A | Discharge status: Home / Self Care | Payer: Medicare Other | Source: Ambulatory Visit | Attending: Internal Medicine | Admitting: Internal Medicine

## 2016-12-21 ENCOUNTER — Encounter: Payer: Self-pay | Admitting: Internal Medicine

## 2016-12-21 ENCOUNTER — Ambulatory Visit (INDEPENDENT_AMBULATORY_CARE_PROVIDER_SITE_OTHER): Payer: Medicare Other | Admitting: Internal Medicine

## 2016-12-21 VITALS — BP 152/90 | HR 65 | Temp 98.1°F | Ht 66.5 in | Wt 232.1 lb

## 2016-12-21 DIAGNOSIS — N959 Unspecified menopausal and perimenopausal disorder: Secondary | ICD-10-CM

## 2016-12-21 DIAGNOSIS — R600 Localized edema: Secondary | ICD-10-CM

## 2016-12-21 DIAGNOSIS — E538 Deficiency of other specified B group vitamins: Secondary | ICD-10-CM | POA: Diagnosis not present

## 2016-12-21 DIAGNOSIS — I10 Essential (primary) hypertension: Secondary | ICD-10-CM

## 2016-12-21 DIAGNOSIS — R300 Dysuria: Secondary | ICD-10-CM

## 2016-12-21 LAB — URINALYSIS, ROUTINE W REFLEX MICROSCOPIC
BILIRUBIN URINE: NEGATIVE
Ketones, ur: NEGATIVE
Nitrite: POSITIVE — AB
PH: 6 (ref 5.0–8.0)
Specific Gravity, Urine: 1.025 (ref 1.000–1.030)
Total Protein, Urine: NEGATIVE
Urine Glucose: NEGATIVE
Urobilinogen, UA: 0.2 (ref 0.0–1.0)

## 2016-12-21 LAB — BASIC METABOLIC PANEL
BUN: 18 mg/dL (ref 6–23)
CO2: 29 mEq/L (ref 19–32)
CREATININE: 1.13 mg/dL (ref 0.40–1.20)
Calcium: 9.6 mg/dL (ref 8.4–10.5)
Chloride: 106 mEq/L (ref 96–112)
GFR: 59.43 mL/min — AB (ref >60.00)
GLUCOSE: 91 mg/dL (ref 70–99)
Potassium: 4.1 mEq/L (ref 3.5–5.1)
Sodium: 141 mEq/L (ref 135–145)

## 2016-12-21 LAB — TSH: TSH: 1.47 u[IU]/mL (ref 0.35–4.50)

## 2016-12-21 NOTE — Assessment & Plan Note (Signed)
May need to re-start Maxzide

## 2016-12-21 NOTE — Assessment & Plan Note (Signed)
UA BMET

## 2016-12-21 NOTE — Progress Notes (Signed)
Subjective:  Patient ID: Jasmin [name], female    DOB: 11/07/1933  Age: 83 y.o. MRN: [id number]  CC: No chief complaint on file.   HPI Jasmin [name] presents for HTN - BP was up Maxzide caused a lot of urinary frequency - stopped. Pt saw her GYN yesterday C/o wt gain  Outpatient Medications Prior to Visit  Medication Sig Dispense Refill  . Cholecalciferol 1000 units tablet Take 2 tablets (2,000 Units total) by mouth daily. 100 tablet 5  . gentamicin (GARAMYCIN) 0.3 % ophthalmic solution STARTING THE DAY OF SURGERY INSTILL 1 DROP IN THE RIGHT EYE 4 TIMES A DAY FOR 1 WEEK THEN STOP  0  . hydrocortisone (ANUSOL-HC) 25 MG suppository Place 1 suppository (25 mg total) rectally 2 (two) times daily. Reported on 10/06/2015 12 suppository 0  . ketorolac (ACULAR) 0.5 % ophthalmic solution STARTING THE DAY OF SURGERY INSTILL 1 DROP 4XDAY FOR 2 WEEKS INTO RIGHT EYE THE D/C  0  . lovastatin (MEVACOR) 40 MG tablet Take 1 tablet (40 mg total) by mouth at bedtime. 90 tablet 2  . metoprolol (LOPRESSOR) 50 MG tablet TAKE 1 TABLET BY MOUTH TWICE A DAY 180 tablet 2  . mupirocin ointment (BACTROBAN) 2 % Use bid on the wound 30 g 0  . polyethylene glycol (GLYCOLAX) powder Take 17 g by mouth daily as needed (constipation). Reported on 12/10/2015    . prednisoLONE acetate (PRED FORTE) 1 % ophthalmic suspension STARTING DAY OF SURGERY: 1 DROP 4X A DAY X1 WEEK, 1 DROP TWICE DAILY X1 WEEK IN RIGHT EYE THEN D/C  0  . ranitidine (ZANTAC) 150 MG tablet Take 1 tablet (150 mg total) by mouth 2 (two) times daily. 180 tablet 3  . triamcinolone ointment (KENALOG) 0.5 % Apply topically 2 (two) times daily. 30 g 0  . triamterene-hydrochlorothiazide (MAXZIDE-25) 37.5-25 MG tablet TAKE 1 EACH (1 TABLET TOTAL) BY MOUTH DAILY. 90 tablet 2  . vitamin B-12 (CYANOCOBALAMIN) 1000 MCG tablet Take 500 mcg by mouth daily.      . metoprolol (LOPRESSOR) 50 MG tablet TAKE 1 TABLET TWICE DAILY 180 tablet 3  .  triamterene-hydrochlorothiazide (MAXZIDE-25) 37.5-25 MG tablet Take 1 tablet by mouth daily. 90 tablet 3   No facility-administered medications prior to visit.     ROS Review of Systems  Constitutional: Negative for activity change, appetite change, chills, fatigue and unexpected weight change.  HENT: Negative for congestion, mouth sores and sinus pressure.   Eyes: Negative for visual disturbance.  Respiratory: Negative for cough and chest tightness.   Gastrointestinal: Negative for abdominal pain and nausea.  Endocrine: Positive for polyuria.  Genitourinary: Positive for frequency and urgency. Negative for difficulty urinating and vaginal pain.  Musculoskeletal: Negative for back pain and gait problem.  Skin: Negative for pallor and rash.  Neurological: Negative for dizziness, tremors, weakness, numbness and headaches.  Psychiatric/Behavioral: Negative for confusion and sleep disturbance.    Objective:  BP (!) 152/90 (BP Location: Right Arm, Patient Position: Sitting, Cuff Size: Large)   Pulse 65   Temp 98.1 F (36.7 C) (Oral)   Ht 5' 6.5" (1.689 m)   Wt 232 lb 1.9 oz (105.3 kg)   SpO2 99%   BMI 36.90 kg/m   BP Readings from Last 3 Encounters:  12/21/16 (!) 152/90  09/13/16 130/68  08/13/16 136/72    Wt Readings from Last 3 Encounters:  12/21/16 232 lb 1.9 oz (105.3 kg)  09/13/16 218 lb (98.9 kg)  08/13/16 224 lb (101.6  kg)    Physical Exam  Constitutional: She appears well-developed. No distress.  HENT:  Head: Normocephalic.  Right Ear: External ear normal.  Left Ear: External ear normal.  Nose: Nose normal.  Mouth/Throat: Oropharynx is clear and moist.  Eyes: Conjunctivae are normal. Pupils are equal, round, and reactive to light. Right eye exhibits no discharge. Left eye exhibits no discharge.  Neck: Normal range of motion. Neck supple. No JVD present. No tracheal deviation present. No thyromegaly present.  Cardiovascular: Normal rate, regular rhythm and  normal heart sounds.   Pulmonary/Chest: No stridor. No respiratory distress. She has no wheezes.  Abdominal: Soft. Bowel sounds are normal. She exhibits no distension and no mass. There is no tenderness. There is no rebound and no guarding.  Musculoskeletal: She exhibits edema. She exhibits no tenderness.  Lymphadenopathy:    She has no cervical adenopathy.  Neurological: She displays normal reflexes. No cranial nerve deficit. She exhibits normal muscle tone. Coordination normal.  Skin: No rash noted. No erythema.  Psychiatric: She has a normal mood and affect. Her behavior is normal. Judgment and thought content normal.  Obese Trace edema B  Lab Results  Component Value Date   WBC 3.1 (L) 08/13/2016   HGB 14.4 08/13/2016   HCT 41.8 08/13/2016   PLT 110.0 (L) 08/13/2016   GLUCOSE 91 08/13/2016   CHOL 169 01/27/2016   TRIG 112.0 01/27/2016   HDL 55.30 01/27/2016   LDLDIRECT 139.3 05/31/2012   LDLCALC 91 01/27/2016   ALT 15 01/27/2016   AST 16 01/27/2016   NA 139 08/13/2016   K 4.1 08/13/2016   CL 102 08/13/2016   CREATININE 1.19 08/13/2016   BUN 20 08/13/2016   CO2 30 08/13/2016   TSH 2.20 01/27/2016   INR 1.16 08/16/2009    Mm Digital Screening Bilateral  Result Date: 03/19/2015 CLINICAL DATA:  Screening. EXAM: DIGITAL SCREENING BILATERAL MAMMOGRAM WITH CAD COMPARISON:  Previous exam(s). ACR Breast Density Category b: There are scattered areas of fibroglandular density. FINDINGS: There are no findings suspicious for malignancy. Images were processed with CAD. IMPRESSION: No mammographic evidence of malignancy. A result letter of this screening mammogram will be mailed directly to the patient. RECOMMENDATION: Screening mammogram in one year. (Code:SM-B-01Y) BI-RADS CATEGORY  1: Negative. Electronically Signed   By: Lillia Mountain M.D.   On: 03/19/2015 15:04    Assessment & Plan:   There are no diagnoses linked to this encounter. I am having Ms. Curlin maintain her  polyethylene glycol powder, vitamin B-12, mupirocin ointment, triamcinolone ointment, Cholecalciferol, hydrocortisone, gentamicin, ketorolac, prednisoLONE acetate, ranitidine, metoprolol, triamterene-hydrochlorothiazide, and lovastatin.  No orders of the defined types were placed in this encounter.    Follow-up: No Follow-up on file.  Walker Kehr, MD

## 2016-12-21 NOTE — Progress Notes (Signed)
Pre visit review using our clinic review tool, if applicable. No additional management support is needed unless otherwise documented below in the visit note. 

## 2016-12-21 NOTE — Assessment & Plan Note (Signed)
Off Maxzide at present Check labs NAS diet

## 2016-12-21 NOTE — Assessment & Plan Note (Signed)
On B12 

## 2016-12-22 ENCOUNTER — Other Ambulatory Visit: Payer: Self-pay | Admitting: Internal Medicine

## 2016-12-22 ENCOUNTER — Other Ambulatory Visit: Payer: Self-pay

## 2016-12-22 ENCOUNTER — Other Ambulatory Visit: Payer: Medicare Other

## 2016-12-22 ENCOUNTER — Telehealth: Payer: Self-pay | Admitting: Internal Medicine

## 2016-12-22 DIAGNOSIS — R300 Dysuria: Secondary | ICD-10-CM

## 2016-12-22 DIAGNOSIS — R8290 Unspecified abnormal findings in urine: Secondary | ICD-10-CM | POA: Diagnosis not present

## 2016-12-22 MED ORDER — CIPROFLOXACIN HCL 250 MG PO TABS: 250.0000 mg | ORAL_TABLET | Freq: Two times a day (BID) | ORAL | 0 refills | Status: DC

## 2016-12-22 NOTE — Telephone Encounter (Signed)
Pt called back for results from yesterday, Please call back.

## 2016-12-22 NOTE — Telephone Encounter (Signed)
See lab encounter.

## 2016-12-24 LAB — URINE CULTURE

## 2016-12-28 ENCOUNTER — Encounter: Payer: Self-pay | Admitting: Internal Medicine

## 2017-02-14 ENCOUNTER — Encounter: Payer: Commercial Managed Care - HMO | Admitting: Internal Medicine

## 2017-02-15 ENCOUNTER — Ambulatory Visit (INDEPENDENT_AMBULATORY_CARE_PROVIDER_SITE_OTHER): Payer: Medicare Other | Admitting: Internal Medicine

## 2017-02-15 ENCOUNTER — Encounter: Payer: Self-pay | Admitting: Internal Medicine

## 2017-02-15 VITALS — BP 138/74 | HR 61 | Temp 97.9°F | Ht 66.5 in | Wt 218.0 lb

## 2017-02-15 DIAGNOSIS — Z0000 Encounter for general adult medical examination without abnormal findings: Secondary | ICD-10-CM

## 2017-02-15 DIAGNOSIS — I10 Essential (primary) hypertension: Secondary | ICD-10-CM

## 2017-02-15 DIAGNOSIS — E785 Hyperlipidemia, unspecified: Secondary | ICD-10-CM

## 2017-02-15 MED ORDER — ZOSTER VAC RECOMB ADJUVANTED 50 MCG/0.5ML IM SUSR: 0.5000 mL | Freq: Once | INTRAMUSCULAR | 1 refills | Status: DC

## 2017-02-15 NOTE — Assessment & Plan Note (Addendum)
Here for medicare wellness/physical  Diet: heart healthy  Physical activity: not sedentary  Depression/mood screen: negative  Hearing: intact to whispered voice  Visual acuity: grossly normal, performs annual eye exam  ADLs: capable  Fall risk: low to none  Home safety: good  Cognitive evaluation: intact to orientation, naming, recall and repetition  EOL planning: adv directives, full code/ I agree  I have personally reviewed and have noted  1. The patient's medical, surgical and social history  2. Their use of alcohol, tobacco or illicit drugs  3. Their current medications and supplements  4. The patient's functional ability including ADL's, fall risks, home safety risks and hearing or visual impairment.  5. Diet and physical activities  6. Evidence for depression or mood disorders 7. The roster of all physicians providing medical care to patient - is listed in the Snapshot section of the chart and reviewed today.    Today patient counseled on age appropriate routine health concerns for screening and prevention, each reviewed and up to date or declined. Immunizations reviewed and up to date or declined. Labs ordered and reviewed. Risk factors for depression reviewed and negative. Hearing function and visual acuity are intact. ADLs screened and addressed as needed. Functional ability and level of safety reviewed and appropriate. Education, counseling and referrals performed based on assessed risks today. Patient provided with a copy of personalized plan for preventive services.   [facility] in 7.18

## 2017-02-15 NOTE — Patient Instructions (Signed)
Health Maintenance for Postmenopausal Women Menopause is a normal process in which your reproductive ability comes to an end. This process happens gradually over a span of months to years, usually between the ages of 22 and 9. Menopause is complete when you have missed 12 consecutive menstrual periods. It is important to talk with your health care provider about some of the most common conditions that affect postmenopausal women, such as heart disease, cancer, and bone loss (osteoporosis). Adopting a healthy lifestyle and getting preventive care can help to promote your health and wellness. Those actions can also lower your chances of developing some of these common conditions. What should I know about menopause? During menopause, you may experience a number of symptoms, such as:  Moderate-to-severe hot flashes.  Night sweats.  Decrease in sex drive.  Mood swings.  Headaches.  Tiredness.  Irritability.  Memory problems.  Insomnia.  Choosing to treat or not to treat menopausal changes is an individual decision that you make with your health care provider. What should I know about hormone replacement therapy and supplements? Hormone therapy products are effective for treating symptoms that are associated with menopause, such as hot flashes and night sweats. Hormone replacement carries certain risks, especially as you become older. If you are thinking about using estrogen or estrogen with progestin treatments, discuss the benefits and risks with your health care provider. What should I know about heart disease and stroke? Heart disease, heart attack, and stroke become more likely as you age. This may be due, in part, to the hormonal changes that your body experiences during menopause. These can affect how your body processes dietary fats, triglycerides, and cholesterol. Heart attack and stroke are both medical emergencies. There are many things that you can do to help prevent heart disease  and stroke:  Have your blood pressure checked at least every 1-2 years. High blood pressure causes heart disease and increases the risk of stroke.  If you are 53-22 years old, ask your health care provider if you should take aspirin to prevent a heart attack or a stroke.  Do not use any tobacco products, including cigarettes, chewing tobacco, or electronic cigarettes. If you need help quitting, ask your health care provider.  It is important to eat a healthy diet and maintain a healthy weight. ? Be sure to include plenty of vegetables, fruits, low-fat dairy products, and lean protein. ? Avoid eating foods that are high in solid fats, added sugars, or salt (sodium).  Get regular exercise. This is one of the most important things that you can do for your health. ? Try to exercise for at least 150 minutes each week. The type of exercise that you do should increase your heart rate and make you sweat. This is known as moderate-intensity exercise. ? Try to do strengthening exercises at least twice each week. Do these in addition to the moderate-intensity exercise.  Know your numbers.Ask your health care provider to check your cholesterol and your blood glucose. Continue to have your blood tested as directed by your health care provider.  What should I know about cancer screening? There are several types of cancer. Take the following steps to reduce your risk and to catch any cancer development as early as possible. Breast Cancer  Practice breast self-awareness. ? This means understanding how your breasts normally appear and feel. ? It also means doing regular breast self-exams. Let your health care provider know about any changes, no matter how small.  If you are 40  or older, have a clinician do a breast exam (clinical breast exam or CBE) every year. Depending on your age, family history, and medical history, it may be recommended that you also have a yearly breast X-ray (mammogram).  If you  have a family history of breast cancer, talk with your health care provider about genetic screening.  If you are at high risk for breast cancer, talk with your health care provider about having an MRI and a mammogram every year.  Breast cancer (BRCA) gene test is recommended for women who have family members with BRCA-related cancers. Results of the assessment will determine the need for genetic counseling and BRCA1 and for BRCA2 testing. BRCA-related cancers include these types: ? Breast. This occurs in males or females. ? Ovarian. ? Tubal. This may also be called fallopian tube cancer. ? Cancer of the abdominal or pelvic lining (peritoneal cancer). ? Prostate. ? Pancreatic.  Cervical, Uterine, and Ovarian Cancer Your health care provider may recommend that you be screened regularly for cancer of the pelvic organs. These include your ovaries, uterus, and vagina. This screening involves a pelvic exam, which includes checking for microscopic changes to the surface of your cervix (Pap test).  For women ages 21-65, health care providers may recommend a pelvic exam and a Pap test every three years. For women ages 79-65, they may recommend the Pap test and pelvic exam, combined with testing for human papilloma virus (HPV), every five years. Some types of HPV increase your risk of cervical cancer. Testing for HPV may also be done on women of any age who have unclear Pap test results.  Other health care providers may not recommend any screening for nonpregnant women who are considered low risk for pelvic cancer and have no symptoms. Ask your health care provider if a screening pelvic exam is right for you.  If you have had past treatment for cervical cancer or a condition that could lead to cancer, you need Pap tests and screening for cancer for at least 20 years after your treatment. If Pap tests have been discontinued for you, your risk factors (such as having a new sexual partner) need to be  reassessed to determine if you should start having screenings again. Some women have medical problems that increase the chance of getting cervical cancer. In these cases, your health care provider may recommend that you have screening and Pap tests more often.  If you have a family history of uterine cancer or ovarian cancer, talk with your health care provider about genetic screening.  If you have vaginal bleeding after reaching menopause, tell your health care provider.  There are currently no reliable tests available to screen for ovarian cancer.  Lung Cancer Lung cancer screening is recommended for adults 69-62 years old who are at high risk for lung cancer because of a history of smoking. A yearly low-dose CT scan of the lungs is recommended if you:  Currently smoke.  Have a history of at least 30 pack-years of smoking and you currently smoke or have quit within the past 15 years. A pack-year is smoking an average of one pack of cigarettes per day for one year.  Yearly screening should:  Continue until it has been 15 years since you quit.  Stop if you develop a health problem that would prevent you from having lung cancer treatment.  Colorectal Cancer  This type of cancer can be detected and can often be prevented.  Routine colorectal cancer screening usually begins at  age 42 and continues through age 45.  If you have risk factors for colon cancer, your health care provider may recommend that you be screened at an earlier age.  If you have a family history of colorectal cancer, talk with your health care provider about genetic screening.  Your health care provider may also recommend using home test kits to check for hidden blood in your stool.  A small camera at the end of a tube can be used to examine your colon directly (sigmoidoscopy or colonoscopy). This is done to check for the earliest forms of colorectal cancer.  Direct examination of the colon should be repeated every  5-10 years until age 71. However, if early forms of precancerous polyps or small growths are found or if you have a family history or genetic risk for colorectal cancer, you may need to be screened more often.  Skin Cancer  Check your skin from head to toe regularly.  Monitor any moles. Be sure to tell your health care provider: ? About any new moles or changes in moles, especially if there is a change in a mole's shape or color. ? If you have a mole that is larger than the size of a pencil eraser.  If any of your family members has a history of skin cancer, especially at a young age, talk with your health care provider about genetic screening.  Always use sunscreen. Apply sunscreen liberally and repeatedly throughout the day.  Whenever you are outside, protect yourself by wearing long sleeves, pants, a wide-brimmed hat, and sunglasses.  What should I know about osteoporosis? Osteoporosis is a condition in which bone destruction happens more quickly than new bone creation. After menopause, you may be at an increased risk for osteoporosis. To help prevent osteoporosis or the bone fractures that can happen because of osteoporosis, the following is recommended:  If you are 46-71 years old, get at least 1,000 mg of calcium and at least 600 mg of vitamin D per day.  If you are older than age 55 but younger than age 65, get at least 1,200 mg of calcium and at least 600 mg of vitamin D per day.  If you are older than age 54, get at least 1,200 mg of calcium and at least 800 mg of vitamin D per day.  Smoking and excessive alcohol intake increase the risk of osteoporosis. Eat foods that are rich in calcium and vitamin D, and do weight-bearing exercises several times each week as directed by your health care provider. What should I know about how menopause affects my mental health? Depression may occur at any age, but it is more common as you become older. Common symptoms of depression  include:  Low or sad mood.  Changes in sleep patterns.  Changes in appetite or eating patterns.  Feeling an overall lack of motivation or enjoyment of activities that you previously enjoyed.  Frequent crying spells.  Talk with your health care provider if you think that you are experiencing depression. What should I know about immunizations? It is important that you get and maintain your immunizations. These include:  Tetanus, diphtheria, and pertussis (Tdap) booster vaccine.  Influenza every year before the flu season begins.  Pneumonia vaccine.  Shingles vaccine.  Your health care provider may also recommend other immunizations. This information is not intended to replace advice given to you by your health care provider. Make sure you discuss any questions you have with your health care provider. Document Released: 10/01/2005  Document Revised: 02/27/2016 Document Reviewed: 05/13/2015 Elsevier Interactive Patient Education  2018 [employer]  

## 2017-02-15 NOTE — Assessment & Plan Note (Signed)
Labs

## 2017-02-15 NOTE — Progress Notes (Signed)
Subjective:  Patient ID: Jasmin [name], female    DOB: 1934/06/22  Age: 83 y.o. MRN: [id number]  CC: No chief complaint on file.   HPI Jasmin [name] presents for well exam  Outpatient Medications Prior to Visit  Medication Sig Dispense Refill  . Cholecalciferol 1000 units tablet Take 2 tablets (2,000 Units total) by mouth daily. 100 tablet 5  . ciprofloxacin (CIPRO) 250 MG tablet Take 1 tablet (250 mg total) by mouth 2 (two) times daily. 10 tablet 0  . gentamicin (GARAMYCIN) 0.3 % ophthalmic solution STARTING THE DAY OF SURGERY INSTILL 1 DROP IN THE RIGHT EYE 4 TIMES A DAY FOR 1 WEEK THEN STOP  0  . hydrocortisone (ANUSOL-HC) 25 MG suppository Place 1 suppository (25 mg total) rectally 2 (two) times daily. Reported on 10/06/2015 12 suppository 0  . ketorolac (ACULAR) 0.5 % ophthalmic solution STARTING THE DAY OF SURGERY INSTILL 1 DROP 4XDAY FOR 2 WEEKS INTO RIGHT EYE THE D/C  0  . lovastatin (MEVACOR) 40 MG tablet Take 1 tablet (40 mg total) by mouth at bedtime. 90 tablet 2  . metoprolol (LOPRESSOR) 50 MG tablet TAKE 1 TABLET BY MOUTH TWICE A DAY 180 tablet 2  . mupirocin ointment (BACTROBAN) 2 % Use bid on the wound 30 g 0  . polyethylene glycol (GLYCOLAX) powder Take 17 g by mouth daily as needed (constipation). Reported on 12/10/2015    . prednisoLONE acetate (PRED FORTE) 1 % ophthalmic suspension STARTING DAY OF SURGERY: 1 DROP 4X A DAY X1 WEEK, 1 DROP TWICE DAILY X1 WEEK IN RIGHT EYE THEN D/C  0  . ranitidine (ZANTAC) 150 MG tablet Take 1 tablet (150 mg total) by mouth 2 (two) times daily. 180 tablet 3  . triamcinolone ointment (KENALOG) 0.5 % Apply topically 2 (two) times daily. 30 g 0  . triamterene-hydrochlorothiazide (MAXZIDE-25) 37.5-25 MG tablet TAKE 1 EACH (1 TABLET TOTAL) BY MOUTH DAILY. 90 tablet 2  . vitamin B-12 (CYANOCOBALAMIN) 1000 MCG tablet Take 500 mcg by mouth daily.       No facility-administered medications prior to visit.     ROS Review of Systems    Constitutional: Negative for activity change, appetite change, chills, fatigue and unexpected weight change.  HENT: Negative for congestion, mouth sores and sinus pressure.   Eyes: Negative for visual disturbance.  Respiratory: Negative for cough and chest tightness.   Gastrointestinal: Negative for abdominal pain and nausea.  Genitourinary: Negative for difficulty urinating, frequency and vaginal pain.  Musculoskeletal: Positive for arthralgias. Negative for back pain and gait problem.  Skin: Negative for pallor and rash.  Neurological: Negative for dizziness, tremors, weakness, numbness and headaches.  Psychiatric/Behavioral: Negative for confusion and sleep disturbance.    Objective:  BP 138/74 (BP Location: Right Arm, Patient Position: Sitting, Cuff Size: Large)   Pulse 61   Temp 97.9 F (36.6 C) (Oral)   Ht 5' 6.5" (1.689 m)   Wt 218 lb (98.9 kg)   SpO2 98%   BMI 34.66 kg/m   BP Readings from Last 3 Encounters:  02/15/17 138/74  12/21/16 (!) 152/90  09/13/16 130/68    Wt Readings from Last 3 Encounters:  02/15/17 218 lb (98.9 kg)  12/21/16 232 lb 1.9 oz (105.3 kg)  09/13/16 218 lb (98.9 kg)    Physical Exam  Constitutional: She appears well-developed. No distress.  HENT:  Head: Normocephalic.  Right Ear: External ear normal.  Left Ear: External ear normal.  Nose: Nose normal.  Mouth/Throat: Oropharynx  is clear and moist.  Eyes: Conjunctivae are normal. Pupils are equal, round, and reactive to light. Right eye exhibits no discharge. Left eye exhibits no discharge.  Neck: Normal range of motion. Neck supple. No JVD present. No tracheal deviation present. No thyromegaly present.  Cardiovascular: Normal rate, regular rhythm and normal heart sounds.   Pulmonary/Chest: No stridor. No respiratory distress. She has no wheezes.  Abdominal: Soft. Bowel sounds are normal. She exhibits no distension and no mass. There is no tenderness. There is no rebound and no guarding.   Musculoskeletal: She exhibits no edema or tenderness.  Lymphadenopathy:    She has no cervical adenopathy.  Neurological: She displays normal reflexes. No cranial nerve deficit. She exhibits normal muscle tone. Coordination normal.  Skin: No rash noted. No erythema.  Psychiatric: She has a normal mood and affect. Her behavior is normal. Judgment and thought content normal.    Lab Results  Component Value Date   WBC 3.1 (L) 08/13/2016   HGB 14.4 08/13/2016   HCT 41.8 08/13/2016   PLT 110.0 (L) 08/13/2016   GLUCOSE 91 12/21/2016   CHOL 169 01/27/2016   TRIG 112.0 01/27/2016   HDL 55.30 01/27/2016   LDLDIRECT 139.3 05/31/2012   LDLCALC 91 01/27/2016   ALT 15 01/27/2016   AST 16 01/27/2016   NA 141 12/21/2016   K 4.1 12/21/2016   CL 106 12/21/2016   CREATININE 1.13 12/21/2016   BUN 18 12/21/2016   CO2 29 12/21/2016   TSH 1.47 12/21/2016   INR 1.16 08/16/2009    Dg Bone Density (dxa)  Result Date: 12/25/2016 Date of study: 12/21/16 Exam: DUAL X-RAY ABSORPTIOMETRY (DXA) FOR BONE MINERAL DENSITY (BMD) Instrument: [employer] [facility] Provider: PCP Indication: follow up for low BMD Comparison: none (please note that it is not possible to compare data from different instruments) Clinical data: Pt is a 83 y.o. female without previous history of fracture. On calcium and vitamin D. Results:  Lumbar spine (L1-L4) Femoral neck (FN) 33% distal radius T-score 1.2 RFN:-1.3 LFN:-0.6 n/a Change in BMD from previous DXA test (%) n/a n/a n/a (*) statistically significant Assessment: the BMD is low according to the [facility] classification for osteoporosis (see below). Fracture risk: moderate FRAX score: 10 year major osteoporotic risk: 5.2%. 10 year hip fracture risk: 1.1%. These are under the thresholds for treatment of 20% and 3%, respectively. Comments: the technical quality of the study is good. Evaluation for secondary causes should be considered if clinically indicated. Recommend optimizing  calcium (1200 mg/day) and vitamin D (800 IU/day) intake. Followup: Repeat BMD is appropriate after 2 years or after 1-2 years if starting treatment. WHO criteria for diagnosis of osteoporosis in postmenopausal women and in men 38 y/o or older: - normal: T-score -1.0 to + 1.0 - osteopenia/low bone density: T-score between -2.5 and -1.0 - osteoporosis: T-score below -2.5 - severe osteoporosis: T-score below -2.5 with history of fragility fracture Note: although not part of the WHO classification, the presence of a fragility fracture, regardless of the T-score, should be considered diagnostic of osteoporosis, provided other causes for the fracture have been excluded. Treatment: The National Osteoporosis Foundation recommends that treatment be considered in postmenopausal women and men age 7 or older with: 1. Hip or vertebral (clinical or morphometric) fracture 2. T-score of - 2.5 or lower at the spine or hip 3. 10-year fracture probability by FRAX of at least 20% for a major osteoporotic fracture and 3% for a hip fracture Loura Pardon MD  Assessment & Plan:   There are no diagnoses linked to this encounter. I am having Ms. Havlik maintain her polyethylene glycol powder, vitamin B-12, mupirocin ointment, triamcinolone ointment, Cholecalciferol, hydrocortisone, gentamicin, ketorolac, prednisoLONE acetate, ranitidine, metoprolol tartrate, triamterene-hydrochlorothiazide, lovastatin, and ciprofloxacin.  No orders of the defined types were placed in this encounter.    Follow-up: No Follow-up on file.  Walker Kehr, MD

## 2017-02-15 NOTE — Assessment & Plan Note (Signed)
Lovastatin   Labs 

## 2017-02-16 ENCOUNTER — Other Ambulatory Visit (INDEPENDENT_AMBULATORY_CARE_PROVIDER_SITE_OTHER): Payer: Medicare Other

## 2017-02-16 DIAGNOSIS — Z0000 Encounter for general adult medical examination without abnormal findings: Secondary | ICD-10-CM

## 2017-02-16 DIAGNOSIS — E785 Hyperlipidemia, unspecified: Secondary | ICD-10-CM | POA: Diagnosis not present

## 2017-02-16 DIAGNOSIS — R300 Dysuria: Secondary | ICD-10-CM | POA: Diagnosis not present

## 2017-02-16 LAB — BASIC METABOLIC PANEL
BUN: 21 mg/dL (ref 6–23)
CHLORIDE: 100 meq/L (ref 96–112)
CO2: 31 mEq/L (ref 19–32)
Calcium: 9.7 mg/dL (ref 8.4–10.5)
Creatinine, Ser: 1.25 mg/dL — ABNORMAL HIGH (ref 0.40–1.20)
GFR: 52.87 mL/min — ABNORMAL LOW (ref >60.00)
GLUCOSE: 99 mg/dL (ref 70–99)
POTASSIUM: 3.7 meq/L (ref 3.5–5.1)
SODIUM: 138 meq/L (ref 135–145)

## 2017-02-16 LAB — URINALYSIS
BILIRUBIN URINE: NEGATIVE
HGB URINE DIPSTICK: NEGATIVE
KETONES UR: NEGATIVE
Leukocytes, UA: NEGATIVE
Nitrite: NEGATIVE
PH: 6.5 (ref 5.0–8.0)
Specific Gravity, Urine: 1.01 (ref 1.000–1.030)
TOTAL PROTEIN, URINE-UPE24: NEGATIVE
URINE GLUCOSE: NEGATIVE
Urobilinogen, UA: 0.2 (ref 0.0–1.0)

## 2017-02-16 LAB — CBC WITH DIFFERENTIAL/PLATELET
BASOS PCT: 1 % (ref 0.0–3.0)
Basophils Absolute: 0 10*3/uL (ref 0.0–0.1)
EOS PCT: 0.4 % (ref 0.0–5.0)
Eosinophils Absolute: 0 10*3/uL (ref 0.0–0.7)
HEMATOCRIT: 41 % (ref 36.0–46.0)
HEMOGLOBIN: 14.1 g/dL (ref 12.0–15.0)
Lymphocytes Relative: 52.5 % — ABNORMAL HIGH (ref 12.0–46.0)
Lymphs Abs: 1.6 10*3/uL (ref 0.7–4.0)
MCHC: 34.4 g/dL (ref 30.0–36.0)
MCV: 90.7 fl (ref 78.0–100.0)
MONO ABS: 0.4 10*3/uL (ref 0.1–1.0)
MONOS PCT: 11.3 % (ref 3.0–12.0)
Neutro Abs: 1.1 10*3/uL — ABNORMAL LOW (ref 1.4–7.7)
Neutrophils Relative %: 34.8 % — ABNORMAL LOW (ref 43.0–77.0)
Platelets: 98 10*3/uL — ABNORMAL LOW (ref 150.0–400.0)
RBC: 4.52 Mil/uL (ref 3.87–5.11)
RDW: 13.7 % (ref 11.5–15.5)
WBC: 3.1 10*3/uL — AB (ref 4.0–10.5)

## 2017-02-16 LAB — HEPATIC FUNCTION PANEL
ALT: 11 U/L (ref 0–35)
AST: 16 U/L (ref 0–37)
Albumin: 4.5 g/dL (ref 3.5–5.2)
Alkaline Phosphatase: 51 U/L (ref 39–117)
BILIRUBIN DIRECT: 0.1 mg/dL (ref 0.0–0.3)
BILIRUBIN TOTAL: 0.5 mg/dL (ref 0.2–1.2)
TOTAL PROTEIN: 7.2 g/dL (ref 6.0–8.3)

## 2017-02-16 LAB — LIPID PANEL
CHOL/HDL RATIO: 3
CHOLESTEROL: 170 mg/dL (ref 0–200)
HDL: 55.8 mg/dL (ref >39.00)
LDL CALC: 89 mg/dL (ref 0–99)
NONHDL: 114.03
Triglycerides: 126 mg/dL (ref 0.0–149.0)
VLDL: 25.2 mg/dL (ref 0.0–40.0)

## 2017-02-16 LAB — TSH: TSH: 1.02 u[IU]/mL (ref 0.35–4.50)

## 2017-02-17 LAB — URINE CULTURE: ORGANISM ID, BACTERIA: NO GROWTH

## 2017-02-28 ENCOUNTER — Ambulatory Visit (AMBULATORY_SURGERY_CENTER): Payer: Self-pay | Admitting: *Deleted

## 2017-02-28 VITALS — Ht 67.0 in | Wt 221.8 lb

## 2017-02-28 DIAGNOSIS — Z86010 Personal history of colonic polyps: Secondary | ICD-10-CM

## 2017-02-28 MED ORDER — NA SULFATE-K SULFATE-MG SULF 17.5-3.13-1.6 GM/177ML PO SOLN
ORAL | 0 refills | Status: DC
Start: 2017-02-28 — End: 2017-03-14

## 2017-02-28 NOTE — Progress Notes (Signed)
No allergies to eggs or soy. No problems with anesthesia.  Pt given Emmi instructions for colonoscopy  No oxygen use  No diet drug use  

## 2017-03-01 ENCOUNTER — Encounter: Payer: Self-pay | Admitting: Internal Medicine

## 2017-03-14 ENCOUNTER — Ambulatory Visit (AMBULATORY_SURGERY_CENTER): Payer: Medicare Other | Admitting: Internal Medicine

## 2017-03-14 ENCOUNTER — Encounter: Payer: Self-pay | Admitting: Internal Medicine

## 2017-03-14 VITALS — BP 134/64 | HR 61 | Temp 97.5°F | Resp 21 | Ht 67.0 in | Wt 221.0 lb

## 2017-03-14 DIAGNOSIS — Z86010 Personal history of colonic polyps: Secondary | ICD-10-CM | POA: Diagnosis present

## 2017-03-14 DIAGNOSIS — D123 Benign neoplasm of transverse colon: Secondary | ICD-10-CM

## 2017-03-14 MED ORDER — SODIUM CHLORIDE 0.9 % IV SOLN: 500.0000 mL | INTRAVENOUS | Status: AC

## 2017-03-14 NOTE — Patient Instructions (Signed)
YOU HAD AN ENDOSCOPIC PROCEDURE TODAY AT THE [facility]:   Refer to the procedure report that was given to you for any specific questions about what was found during the examination.  If the procedure report does not answer your questions, please call your gastroenterologist to clarify.  If you requested that your care partner not be given the details of your procedure findings, then the procedure report has been included in a sealed envelope for you to review at your convenience later.  YOU SHOULD EXPECT: Some feelings of bloating in the abdomen. Passage of more gas than usual.  Walking can help get rid of the air that was put into your GI tract during the procedure and reduce the bloating. If you had a lower endoscopy (such as a colonoscopy or flexible sigmoidoscopy) you may notice spotting of blood in your stool or on the toilet paper. If you underwent a bowel prep for your procedure, you may not have a normal bowel movement for a few days.  Please Note:  You might notice some irritation and congestion in your nose or some drainage.  This is from the oxygen used during your procedure.  There is no need for concern and it should clear up in a day or so.  SYMPTOMS TO REPORT IMMEDIATELY:   Following lower endoscopy (colonoscopy or flexible sigmoidoscopy):  Excessive amounts of blood in the stool  Significant tenderness or worsening of abdominal pains  Swelling of the abdomen that is new, acute  Fever of 100F or higher   For urgent or emergent issues, a gastroenterologist can be reached at any hour by calling [phone].   DIET:  We do recommend a small meal at first, but then you may proceed to your regular diet.  Drink plenty of fluids but you should avoid alcoholic beverages for 24 hours.  ACTIVITY:  You should plan to take it easy for the rest of today and you should NOT DRIVE or use heavy machinery until tomorrow (because of the sedation medicines used during the test).     FOLLOW UP: Our staff will call the number listed on your records the next business day following your procedure to check on you and address any questions or concerns that you may have regarding the information given to you following your procedure. If we do not reach you, we will leave a message.  However, if you are feeling well and you are not experiencing any problems, there is no need to return our call.  We will assume that you have returned to your regular daily activities without incident.  If any biopsies were taken you will be contacted by phone or by letter within the next 1-3 weeks.  Please call us at [phone] if you have not heard about the biopsies in 3 weeks.    SIGNATURES/CONFIDENTIALITY: You and/or your care partner have signed paperwork which will be entered into your electronic medical record.  These signatures attest to the fact that that the information above on your After Visit Summary has been reviewed and is understood.  Full responsibility of the confidentiality of this discharge information lies with you and/or your care-partner.   Resume medications. Information given on polyps,diverticulosis and hemorrhoids. 

## 2017-03-14 NOTE — Progress Notes (Signed)
Called to room to assist during endoscopic procedure.  Patient ID and intended procedure confirmed with present staff. Received instructions for my participation in the procedure from the performing physician.  

## 2017-03-14 NOTE — Progress Notes (Signed)
Pt's states no medical or surgical changes since previsit or office visit. 

## 2017-03-14 NOTE — Op Note (Signed)
[facility] Patient Name: Jasmin [name] Procedure Date: 03/14/2017 10:15 AM MRN: [id number] Endoscopist: Docia Chuck. Henrene Pastor , MD Age: 83 Referring MD:  Date of Birth: Mar 15, 1934 Gender: Female Account #: [id number] Procedure:                Colonoscopy, with cold snare polypectomy x 2 Indications:              High risk colon cancer surveillance: Personal                            history of multiple (3 or more) adenomas. Previous                            examinations 2002, 2011, 2012, 2015 Medicines:                Monitored Anesthesia Care Procedure:                Pre-Anesthesia Assessment:                           - Prior to the procedure, a History and Physical                            was performed, and patient medications and                            allergies were reviewed. The patient's tolerance of                            previous anesthesia was also reviewed. The risks                            and benefits of the procedure and the sedation                            options and risks were discussed with the patient.                            All questions were answered, and informed consent                            was obtained. Prior Anticoagulants: The patient has                            taken no previous anticoagulant or antiplatelet                            agents. ASA Grade Assessment: II - A patient with                            mild systemic disease. After reviewing the risks                            and benefits, the patient was deemed in  satisfactory condition to undergo the procedure.                           After obtaining informed consent, the colonoscope                            was passed under direct vision. Throughout the                            procedure, the patient's blood pressure, pulse, and                            oxygen saturations were monitored continuously. The            Colonoscope was introduced through the anus and                            advanced to the the cecum, identified by                            appendiceal orifice and ileocecal valve. The                            ileocecal valve, appendiceal orifice, and rectum                            were photographed. The quality of the bowel                            preparation was excellent. The colonoscopy was                            performed without difficulty. The patient tolerated                            the procedure well. The bowel preparation used was                            SUPREP. Scope In: 10:16:59 AM Scope Out: 10:28:06 AM Scope Withdrawal Time: 0 hours 9 minutes 16 seconds  Total Procedure Duration: 0 hours 11 minutes 7 seconds  Findings:                 Two polyps were found in the transverse colon. The                            polyps were 2 mm in size. These polyps were removed                            with a cold snare. Resection and retrieval were                            complete.  Multiple diverticula were found in the left colon.                           Internal hemorrhoids were found during retroflexion.                           The exam was otherwise without abnormality on                            direct and retroflexion views. Complications:            No immediate complications. Estimated blood loss:                            None. Estimated Blood Loss:     Estimated blood loss: none. Impression:               - Two 2 mm polyps in the transverse colon, removed                            with a cold snare. Resected and retrieved.                           - Diverticulosis in the left colon.                           - Internal hemorrhoids.                           - The examination was otherwise normal on direct                            and retroflexion views. Recommendation:           - Repeat colonoscopy is not  recommended for                            surveillance.                           - Patient has a contact number available for                            emergencies. The signs and symptoms of potential                            delayed complications were discussed with the                            patient. Return to normal activities tomorrow.                            Written discharge instructions were provided to the                            patient.                           -  Resume previous diet.                           - Continue present medications.                           - Await pathology results. Docia Chuck. Henrene Pastor, MD 03/14/2017 10:35:37 AM This report has been signed electronically.

## 2017-03-14 NOTE — Progress Notes (Signed)
No problems noted in the recovery room. maw 

## 2017-03-14 NOTE — Progress Notes (Signed)
Report to PACU, RN, vss, BBS= Clear.  

## 2017-03-15 ENCOUNTER — Telehealth: Payer: Self-pay | Admitting: *Deleted

## 2017-03-15 NOTE — Telephone Encounter (Signed)
  Follow up Call-  Call back number 03/14/2017  Post procedure Call Back phone  # [phone]  Permission to leave phone message Yes  Some recent data might be hidden     Patient questions:  Do you have a fever, pain , or abdominal swelling? No. Pain Score  0 *  Have you tolerated food without any problems? Yes.    Have you been able to return to your normal activities? Yes.    Do you have any questions about your discharge instructions: Diet   No. Medications  No. Follow up visit  No.  Do you have questions or concerns about your Care? No.  Actions: * If pain score is 4 or above: No action needed, pain <4.

## 2017-03-17 ENCOUNTER — Encounter: Payer: Self-pay | Admitting: Internal Medicine

## 2017-06-01 ENCOUNTER — Telehealth: Payer: Self-pay

## 2017-06-01 MED ORDER — METOPROLOL TARTRATE 50 MG PO TABS: 50.0000 mg | ORAL_TABLET | Freq: Two times a day (BID) | ORAL | 0 refills | Status: DC

## 2017-06-01 NOTE — Telephone Encounter (Signed)
[facility] sent rf rq for metoprolol. erx sent.

## 2017-06-02 ENCOUNTER — Telehealth: Payer: Self-pay | Admitting: *Deleted

## 2017-06-02 MED ORDER — RANITIDINE HCL 150 MG PO TABS: 150.0000 mg | ORAL_TABLET | Freq: Two times a day (BID) | ORAL | 0 refills | Status: DC

## 2017-06-02 MED ORDER — TRIAMTERENE-HCTZ 37.5-25 MG PO TABS: 1.0000 | ORAL_TABLET | Freq: Every day | ORAL | 0 refills | Status: DC

## 2017-06-02 MED ORDER — LOVASTATIN 40 MG PO TABS: 40.0000 mg | ORAL_TABLET | Freq: Every day | ORAL | 0 refills | Status: DC

## 2017-06-02 NOTE — Telephone Encounter (Signed)
Refill sent to [facility].

## 2017-06-02 NOTE — Addendum Note (Signed)
Addended by: Verlene Mayer A on: 06/02/2017 11:44 AM   Modules accepted: Orders

## 2017-06-15 ENCOUNTER — Ambulatory Visit (INDEPENDENT_AMBULATORY_CARE_PROVIDER_SITE_OTHER): Payer: Medicare Other

## 2017-06-15 ENCOUNTER — Telehealth: Payer: Self-pay | Admitting: Internal Medicine

## 2017-06-15 DIAGNOSIS — Z23 Encounter for immunization: Secondary | ICD-10-CM | POA: Diagnosis not present

## 2017-06-15 NOTE — Telephone Encounter (Signed)
Pt came in to get high dose flu shot today, pt report of received shingle vaccin on 06/13/2017. Ok to give flu shot today on opposite injection sit per Dr. Camila Li.

## 2017-06-23 ENCOUNTER — Other Ambulatory Visit: Payer: Self-pay

## 2017-06-23 MED ORDER — RANITIDINE HCL 150 MG PO TABS: 150.0000 mg | ORAL_TABLET | Freq: Two times a day (BID) | ORAL | 1 refills | Status: DC

## 2017-06-23 MED ORDER — METOPROLOL TARTRATE 50 MG PO TABS: 50.0000 mg | ORAL_TABLET | Freq: Two times a day (BID) | ORAL | 1 refills | Status: DC

## 2017-06-27 ENCOUNTER — Telehealth: Payer: Self-pay | Admitting: Internal Medicine

## 2017-06-27 NOTE — Telephone Encounter (Signed)
Metoprolol was sent to [facility] but needs to be sent to [facility] (card has been scanned into documents).  Also, [facility] needs to be primary mail order and [facility] mail order needs to be taken off.

## 2017-06-28 MED ORDER — METOPROLOL TARTRATE 50 MG PO TABS: 50.0000 mg | ORAL_TABLET | Freq: Two times a day (BID) | ORAL | 1 refills | Status: DC

## 2017-06-28 NOTE — Telephone Encounter (Signed)
Updated mail order pharmacy & resent to to [facility]...Jasmin [name]

## 2017-07-01 ENCOUNTER — Telehealth: Payer: Self-pay | Admitting: Internal Medicine

## 2017-07-01 DIAGNOSIS — B353 Tinea pedis: Secondary | ICD-10-CM | POA: Diagnosis not present

## 2017-07-01 DIAGNOSIS — I10 Essential (primary) hypertension: Secondary | ICD-10-CM | POA: Diagnosis not present

## 2017-07-01 NOTE — Telephone Encounter (Signed)
Patient Name: Jasmin [name] DOB: November 04, 1933 Initial Comment Caller states she is having issues with her foot. It has been burning, only on her toes. Nurse Assessment Nurse: Macie Burows, RN, Butch Penny Date/Time (Eastern Time): 07/01/2017 8:14:48 AM Confirm and document reason for call. If symptomatic, describe symptoms. ---Caller states burning and pain in left foot. Pain N 10/10 throughout night. 5th toes seems to be swollen. Does the patient have any new or worsening symptoms? ---Yes Will a triage be completed? ---Yes Related visit to physician within the last 2 weeks? ---No Does the PT have any chronic conditions? (i.e. diabetes, asthma, etc.) ---No Is this a behavioral health or substance abuse call? ---No Guidelines Guideline Title Affirmed Question Affirmed Notes Foot Pain [1] Swollen foot AND [2] no fever (Exceptions: localized bump from bunions, calluses, insect bite, sting) Final Disposition User See Physician within [place], RN, Butch Penny Referrals GO TO FACILITY UNDECIDED GO TO FACILITY OTHER - SPECIFY Caller Disagree/Comply Comply Caller Understands No PreDisposition Call Doctor

## 2017-07-13 ENCOUNTER — Other Ambulatory Visit: Payer: Self-pay

## 2017-07-13 ENCOUNTER — Emergency Department (HOSPITAL_COMMUNITY): Payer: Medicare Other

## 2017-07-13 ENCOUNTER — Emergency Department (HOSPITAL_COMMUNITY)
Admission: EM | Admit: 2017-07-13 | Discharge: 2017-07-13 | Disposition: A | Discharge status: Home / Self Care | Payer: Medicare Other | Attending: Emergency Medicine | Admitting: Emergency Medicine

## 2017-07-13 ENCOUNTER — Encounter (HOSPITAL_COMMUNITY): Payer: Self-pay | Admitting: Emergency Medicine

## 2017-07-13 DIAGNOSIS — Z79899 Other long term (current) drug therapy: Secondary | ICD-10-CM | POA: Diagnosis not present

## 2017-07-13 DIAGNOSIS — I10 Essential (primary) hypertension: Secondary | ICD-10-CM | POA: Insufficient documentation

## 2017-07-13 DIAGNOSIS — M79671 Pain in right foot: Secondary | ICD-10-CM | POA: Diagnosis not present

## 2017-07-13 DIAGNOSIS — E785 Hyperlipidemia, unspecified: Secondary | ICD-10-CM | POA: Diagnosis not present

## 2017-07-13 LAB — I-STAT CHEM 8, ED
BUN: 23 mg/dL — AB (ref 6–20)
CALCIUM ION: 1.24 mmol/L (ref 1.15–1.40)
Chloride: 103 mmol/L (ref 101–111)
Creatinine, Ser: 1.2 mg/dL — ABNORMAL HIGH (ref 0.44–1.00)
Glucose, Bld: 102 mg/dL — ABNORMAL HIGH (ref 65–99)
HEMATOCRIT: 40 % (ref 36.0–46.0)
Hemoglobin: 13.6 g/dL (ref 12.0–15.0)
Potassium: 4.1 mmol/L (ref 3.5–5.1)
Sodium: 140 mmol/L (ref 135–145)
TCO2: 27 mmol/L (ref 22–32)

## 2017-07-13 LAB — URIC ACID: Uric Acid, Serum: 7.9 mg/dL — ABNORMAL HIGH (ref 2.3–6.6)

## 2017-07-13 MED ORDER — METHYLPREDNISOLONE 4 MG PO TBPK: ORAL_TABLET | ORAL | 0 refills | Status: DC

## 2017-07-13 MED ORDER — HYDROCODONE-ACETAMINOPHEN 5-325 MG PO TABS
1.0000 | ORAL_TABLET | Freq: Once | ORAL | Status: AC
  Administered 2017-07-13: 1 via ORAL
  Filled 2017-07-13: qty 1

## 2017-07-13 MED ORDER — TRAMADOL HCL 50 MG PO TABS: 50.0000 mg | ORAL_TABLET | Freq: Four times a day (QID) | ORAL | 0 refills | Status: DC | PRN

## 2017-07-13 NOTE — ED Triage Notes (Signed)
Pt presents to ED for assessment of right foot pain that radiates up from her big toe to her heel.   Pt states she was having a similar pain last week in her left leg and was told she had infection between her toes.   Pt c/o pain whether standing or sitting still.  NAD at triage.
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
## 2017-07-13 NOTE — Discharge Instructions (Signed)
It was my pleasure taking care of you today!   Take steroid as directed.  Tylenol as needed for mild to moderate pain. Pain medication prescribed today only as needed for severe pain.   Please call your primary care doctor to schedule a follow up appointment.   Return to ER for new or worsening symptoms, any additional concerns.

## 2017-07-15 ENCOUNTER — Ambulatory Visit (INDEPENDENT_AMBULATORY_CARE_PROVIDER_SITE_OTHER): Payer: Medicare Other | Admitting: Family Medicine

## 2017-07-15 ENCOUNTER — Encounter: Payer: Self-pay | Admitting: Family Medicine

## 2017-07-15 VITALS — BP 138/78 | HR 73 | Temp 98.2°F | Ht 67.0 in | Wt 222.2 lb

## 2017-07-15 DIAGNOSIS — M109 Gout, unspecified: Secondary | ICD-10-CM

## 2017-07-15 MED ORDER — TRAMADOL HCL 50 MG PO TABS: 50.0000 mg | ORAL_TABLET | Freq: Four times a day (QID) | ORAL | 0 refills | Status: DC | PRN

## 2017-07-15 MED ORDER — COLCHICINE 0.6 MG PO TABS: ORAL_TABLET | ORAL | 0 refills | Status: DC

## 2017-07-15 NOTE — Progress Notes (Signed)
Pre visit review using our clinic review tool, if applicable. No additional management support is needed unless otherwise documented below in the visit note. 

## 2017-07-15 NOTE — Progress Notes (Signed)
Chief Complaint  Patient presents with  . Acute Visit    fu ed gout    Subjective: Patient is a 83 y.o. female here for b/l toe pain.  Seen in ED on 11/21 for toe pain, uric acid 7.9, started on tramadol and Medrol dosepak. Not fully better. Denies injury or change in activity or footwear. The 1st MTP b/l is affected, worse on R. Sharp and intermittent pain. No palliative or provocative factors. It does not catch or lock. Denies numbness or tingling. No change in diet and no known hx of gout.   ROS: MSK: +foot pain Neuro: no numbness or tingling   Past Medical History:  Diagnosis Date  . Contact dermatitis and other eczema, due to unspecified cause   . Diverticulosis of colon   . Esophageal stricture   . GERD with stricture    Dr Henrene Pastor  . Hemorrhoids   . Hiatal hernia   . Hyperlipidemia   . Hypertension   . Onychomycosis   . Personal history of urinary calculi   . Polycystic kidney   . Tubulovillous adenoma polyp of colon   . Unspecified vitamin D deficiency   . Vitamin B12 deficiency 2009   Borderline    Objective: BP 138/78   Pulse 73   Temp 98.2 F (36.8 C) (Oral)   Ht 5\' 7"  (1.702 m)   Wt 222 lb 3.2 oz (100.8 kg)   BMI 34.80 kg/m  General: Awake, appears stated age Heart: Reg rate and rhythm, 2+ DP pulse b/l, 1+ PT pulse b/l Lungs: No accessory muscle use MSK: No edema, no erythema, no warmth, no TTP, 5/5 strength in feet, no pain with strength testing Psych: Age appropriate judgment and insight, normal affect and mood  Assessment and Plan: Gout involving toe of right foot, unspecified cause, unspecified chronicity  Add colchicine. Tylenol. Tramadol refill. Ice. Wear supportive shoes. F/u with Dr Mamie Nick in 2-3 weeks as originally scheduled.  The patient voiced understanding and agreement to the plan.  [place], DO 07/15/17  9:26 AM

## 2017-07-15 NOTE — Patient Instructions (Signed)
OK to take Tylenol 1000 mg (2 extra strength tabs) or 975 mg (3 regular strength tabs) every 6 hours as needed.  Ice/cold pack over area for 10-15 min every 2-3 hours while awake.  Finish the methylprednisolone (medrol dosepak).  Wear supportive shoes.   Keep your appointment with Dr. Mamie Nick.

## 2017-07-15 NOTE — Patient Outreach (Signed)
Spoke w/ patient in regards to recent ED visit. Patient stated she had been baring w/ foot pain for a few days. Patient did follow up today w/ PCP. Successful letter, 24 nurse line, and know before you go mailed to patient on 07/15/17.

## 2017-07-15 NOTE — Patient Outreach (Signed)
Please disregard first letter. Correct letter now submitted for processing.

## 2017-07-18 ENCOUNTER — Other Ambulatory Visit: Payer: Self-pay

## 2017-07-18 MED ORDER — RANITIDINE HCL 150 MG PO TABS
150.0000 mg | ORAL_TABLET | Freq: Two times a day (BID) | ORAL | 3 refills | Status: DC
Start: 2017-07-18 — End: 2017-08-30

## 2017-08-01 ENCOUNTER — Other Ambulatory Visit: Payer: Self-pay | Admitting: Internal Medicine

## 2017-08-02 ENCOUNTER — Ambulatory Visit: Payer: Medicare Other | Admitting: Internal Medicine

## 2017-08-09 ENCOUNTER — Ambulatory Visit: Payer: Medicare Other | Admitting: Internal Medicine

## 2017-08-11 ENCOUNTER — Other Ambulatory Visit: Payer: Self-pay | Admitting: Family Medicine

## 2017-08-30 ENCOUNTER — Other Ambulatory Visit (INDEPENDENT_AMBULATORY_CARE_PROVIDER_SITE_OTHER): Payer: Medicare Other

## 2017-08-30 ENCOUNTER — Ambulatory Visit (INDEPENDENT_AMBULATORY_CARE_PROVIDER_SITE_OTHER): Payer: Medicare Other | Admitting: Internal Medicine

## 2017-08-30 ENCOUNTER — Encounter: Payer: Self-pay | Admitting: Internal Medicine

## 2017-08-30 VITALS — BP 126/78 | HR 73 | Temp 98.5°F | Ht 67.0 in | Wt 223.0 lb

## 2017-08-30 DIAGNOSIS — M109 Gout, unspecified: Secondary | ICD-10-CM

## 2017-08-30 DIAGNOSIS — G8929 Other chronic pain: Secondary | ICD-10-CM

## 2017-08-30 DIAGNOSIS — M545 Low back pain: Secondary | ICD-10-CM | POA: Diagnosis not present

## 2017-08-30 DIAGNOSIS — I10 Essential (primary) hypertension: Secondary | ICD-10-CM

## 2017-08-30 DIAGNOSIS — E538 Deficiency of other specified B group vitamins: Secondary | ICD-10-CM

## 2017-08-30 LAB — BASIC METABOLIC PANEL
BUN: 17 mg/dL (ref 6–23)
CO2: 30 mEq/L (ref 19–32)
CREATININE: 1.09 mg/dL (ref 0.40–1.20)
Calcium: 9.6 mg/dL (ref 8.4–10.5)
Chloride: 102 mEq/L (ref 96–112)
GFR: 61.84 mL/min (ref >60.00)
GLUCOSE: 89 mg/dL (ref 70–99)
Potassium: 3.8 mEq/L (ref 3.5–5.1)
Sodium: 138 mEq/L (ref 135–145)

## 2017-08-30 MED ORDER — RANITIDINE HCL 150 MG PO TABS: 150.0000 mg | ORAL_TABLET | Freq: Two times a day (BID) | ORAL | 3 refills | Status: DC

## 2017-08-30 MED ORDER — TRIAMTERENE-HCTZ 37.5-25 MG PO TABS: 1.0000 | ORAL_TABLET | Freq: Every day | ORAL | 3 refills | Status: DC

## 2017-08-30 MED ORDER — LOVASTATIN 40 MG PO TABS: 40.0000 mg | ORAL_TABLET | Freq: Every day | ORAL | 3 refills | Status: DC

## 2017-08-30 MED ORDER — METOPROLOL TARTRATE 50 MG PO TABS: 50.0000 mg | ORAL_TABLET | Freq: Two times a day (BID) | ORAL | 3 refills | Status: DC

## 2017-08-30 NOTE — Assessment & Plan Note (Signed)
B feet Colchicine prn

## 2017-08-30 NOTE — Assessment & Plan Note (Signed)
Not on meds now

## 2017-08-30 NOTE — Assessment & Plan Note (Signed)
On B12 

## 2017-08-30 NOTE — Progress Notes (Signed)
Subjective:  Patient ID: Jasmin [name], female    DOB: [birth year]  Age: 84 y.o. MRN: [id number]  CC: No chief complaint on file.   HPI Jasmin [name] presents for gout, HTN, OA f/u  Outpatient Medications Prior to Visit  Medication Sig Dispense Refill  . Cholecalciferol 1000 units tablet Take 2 tablets (2,000 Units total) by mouth daily. 100 tablet 5  . colchicine 0.6 MG tablet TAKE 1 TAB AND REPEAT IN AN HOUR. TAKE ONE TAB DAILY UNTIL RESOLUTION OF PAIN. 30 tablet 0  . hydrocortisone (ANUSOL-HC) 25 MG suppository Place 1 suppository (25 mg total) rectally 2 (two) times daily. Reported on 10/06/2015 12 suppository 0  . lovastatin (MEVACOR) 40 MG tablet TAKE 1 TABLET BY MOUTH AT  BEDTIME 90 tablet 3  . metoprolol tartrate (LOPRESSOR) 50 MG tablet Take 1 tablet (50 mg total) 2 (two) times daily by mouth. 180 tablet 1  . ranitidine (ZANTAC) 150 MG tablet Take 1 tablet (150 mg total) by mouth 2 (two) times daily. 180 tablet 3  . traMADol (ULTRAM) 50 MG tablet Take 1 tablet (50 mg total) by mouth every 6 (six) hours as needed for severe pain. 10 tablet 0  . triamcinolone ointment (KENALOG) 0.5 % Apply topically 2 (two) times daily. 30 g 0  . triamterene-hydrochlorothiazide (MAXZIDE-25) 37.5-25 MG tablet TAKE 1 TABLET BY MOUTH  DAILY 90 tablet 3  . vitamin B-12 (CYANOCOBALAMIN) 1000 MCG tablet Take 500 mcg by mouth daily.       Facility-Administered Medications Prior to Visit  Medication Dose Route Frequency Provider Last Rate Last Dose  . 0.9 %  sodium chloride infusion  500 mL Intravenous Continuous Irene Shipper, MD        ROS Review of Systems  Constitutional: Negative for activity change, appetite change, chills, fatigue and unexpected weight change.  HENT: Negative for congestion, mouth sores and sinus pressure.   Eyes: Negative for visual disturbance.  Respiratory: Negative for cough and chest tightness.   Gastrointestinal: Negative for abdominal pain and nausea.    Genitourinary: Negative for difficulty urinating, frequency and vaginal pain.  Musculoskeletal: Positive for arthralgias. Negative for back pain and gait problem.  Skin: Negative for pallor and rash.  Neurological: Negative for dizziness, tremors, weakness, numbness and headaches.  Psychiatric/Behavioral: Negative for confusion and sleep disturbance.    Objective:  BP 126/78 (BP Location: Right Arm, Patient Position: Sitting, Cuff Size: Large)   Pulse 73   Temp 98.5 F (36.9 C) (Oral)   Ht 5\' 7"  (1.702 m)   Wt 223 lb (101.2 kg)   SpO2 98%   BMI 34.93 kg/m   BP Readings from Last 3 Encounters:  08/30/17 126/78  07/15/17 138/78  07/13/17 (!) 146/72    Wt Readings from Last 3 Encounters:  08/30/17 223 lb (101.2 kg)  07/15/17 222 lb 3.2 oz (100.8 kg)  03/14/17 221 lb (100.2 kg)    Physical Exam  Constitutional: She appears well-developed. No distress.  HENT:  Head: Normocephalic.  Right Ear: External ear normal.  Left Ear: External ear normal.  Nose: Nose normal.  Mouth/Throat: Oropharynx is clear and moist.  Eyes: Conjunctivae are normal. Pupils are equal, round, and reactive to light. Right eye exhibits no discharge. Left eye exhibits no discharge.  Neck: Normal range of motion. Neck supple. No JVD present. No tracheal deviation present. No thyromegaly present.  Cardiovascular: Normal rate, regular rhythm and normal heart sounds.  Pulmonary/Chest: No stridor. No respiratory distress. She has no  wheezes.  Abdominal: Soft. Bowel sounds are normal. She exhibits no distension and no mass. There is no tenderness. There is no rebound and no guarding.  Musculoskeletal: She exhibits no edema.  Lymphadenopathy:    She has no cervical adenopathy.  Neurological: She displays normal reflexes. No cranial nerve deficit. She exhibits normal muscle tone. Coordination normal.  Skin: No rash noted. No erythema.  Psychiatric: She has a normal mood and affect. Her behavior is normal.  Judgment and thought content normal.  obese  Lab Results  Component Value Date   WBC 3.1 (L) 02/16/2017   HGB 13.6 07/13/2017   HCT 40.0 07/13/2017   PLT 98.0 (L) 02/16/2017   GLUCOSE 102 (H) 07/13/2017   CHOL 170 02/16/2017   TRIG 126.0 02/16/2017   HDL 55.80 02/16/2017   LDLDIRECT 139.3 05/31/2012   LDLCALC 89 02/16/2017   ALT 11 02/16/2017   AST 16 02/16/2017   NA 140 07/13/2017   K 4.1 07/13/2017   CL 103 07/13/2017   CREATININE 1.20 (H) 07/13/2017   BUN 23 (H) 07/13/2017   CO2 31 02/16/2017   TSH 1.02 02/16/2017   INR 1.16 08/16/2009    Dg Foot Complete Right  Result Date: 07/13/2017 CLINICAL DATA:  Pain in the first toe of the right foot radiating into the ball of the right foot tonight. No known injury. EXAM: RIGHT FOOT COMPLETE - 3+ VIEW COMPARISON:  None. FINDINGS: Mild degenerative changes in the first metatarsal-phalangeal joint. Right foot appears otherwise intact. No evidence of acute fracture or subluxation. No focal bone lesion or bone destruction. Bone cortex and trabecular architecture appear intact. No radiopaque soft tissue foreign bodies. IMPRESSION: Mild degenerative changes.  No acute bony abnormalities. Electronically Signed   By: Lucienne Capers M.D.   On: 07/13/2017 02:19    Assessment & Plan:   There are no diagnoses linked to this encounter. I am having Jasmin [name] maintain her vitamin B-12, triamcinolone ointment, Cholecalciferol, hydrocortisone, metoprolol tartrate, traMADol, ranitidine, triamterene-hydrochlorothiazide, lovastatin, and colchicine. We will continue to administer sodium chloride.  No orders of the defined types were placed in this encounter.    Follow-up: No Follow-up on file.  Walker Kehr, MD

## 2017-08-30 NOTE — Assessment & Plan Note (Signed)
Metoprolol, Maxzide 

## 2017-11-03 ENCOUNTER — Other Ambulatory Visit: Payer: Self-pay | Admitting: Internal Medicine

## 2017-12-28 ENCOUNTER — Ambulatory Visit (INDEPENDENT_AMBULATORY_CARE_PROVIDER_SITE_OTHER): Payer: Medicare Other | Admitting: Internal Medicine

## 2017-12-28 ENCOUNTER — Encounter: Payer: Self-pay | Admitting: Internal Medicine

## 2017-12-28 DIAGNOSIS — I10 Essential (primary) hypertension: Secondary | ICD-10-CM

## 2017-12-28 DIAGNOSIS — M792 Neuralgia and neuritis, unspecified: Secondary | ICD-10-CM | POA: Diagnosis not present

## 2017-12-28 DIAGNOSIS — M109 Gout, unspecified: Secondary | ICD-10-CM | POA: Diagnosis not present

## 2017-12-28 DIAGNOSIS — R209 Unspecified disturbances of skin sensation: Secondary | ICD-10-CM | POA: Diagnosis not present

## 2017-12-28 DIAGNOSIS — Z6835 Body mass index (BMI) 35.0-35.9, adult: Secondary | ICD-10-CM | POA: Diagnosis not present

## 2017-12-28 MED ORDER — GABAPENTIN 100 MG PO CAPS: 100.0000 mg | ORAL_CAPSULE | Freq: Two times a day (BID) | ORAL | 3 refills | Status: DC | PRN

## 2017-12-28 NOTE — Assessment & Plan Note (Signed)
May need to d/c Maxzide if gout came back

## 2017-12-28 NOTE — Assessment & Plan Note (Signed)
Wt Readings from Last 3 Encounters:  12/28/17 224 lb (101.6 kg)  08/30/17 223 lb (101.2 kg)  07/15/17 222 lb 3.2 oz (100.8 kg)

## 2017-12-28 NOTE — Assessment & Plan Note (Signed)
2019 B toes ?etiology Wide shoes Gabapentin B complex vitamin one a day

## 2017-12-28 NOTE — Assessment & Plan Note (Addendum)
Metoprolol, Maxzide May need to d/c Maxzide if gout came back

## 2017-12-28 NOTE — Progress Notes (Signed)
Subjective:  Patient ID: Jasmin [name], female    DOB: 1934-03-03  Age: 84 y.o. MRN: [id number]  CC: No chief complaint on file.   HPI Venola Castello Lipson presents for HTN, gout, OA f/u C/o pain in the tips of her toes off and on...  Outpatient Medications Prior to Visit  Medication Sig Dispense Refill  . Cholecalciferol 1000 units tablet Take 2 tablets (2,000 Units total) by mouth daily. 100 tablet 5  . colchicine 0.6 MG tablet TAKE 1 TAB AND REPEAT IN AN HOUR. TAKE ONE TAB DAILY UNTIL RESOLUTION OF PAIN. 30 tablet 0  . hydrocortisone (ANUSOL-HC) 25 MG suppository Place 1 suppository (25 mg total) rectally 2 (two) times daily. Reported on 10/06/2015 12 suppository 0  . lovastatin (MEVACOR) 40 MG tablet Take 1 tablet (40 mg total) by mouth at bedtime. 90 tablet 3  . metoprolol tartrate (LOPRESSOR) 50 MG tablet Take 1 tablet (50 mg total) by mouth 2 (two) times daily. 180 tablet 3  . ranitidine (ZANTAC) 150 MG tablet Take 1 tablet (150 mg total) by mouth 2 (two) times daily. 180 tablet 3  . traMADol (ULTRAM) 50 MG tablet Take 1 tablet (50 mg total) by mouth every 6 (six) hours as needed for severe pain. 10 tablet 0  . triamcinolone ointment (KENALOG) 0.5 % Apply topically 2 (two) times daily. 30 g 0  . triamterene-hydrochlorothiazide (MAXZIDE-25) 37.5-25 MG tablet Take 1 tablet by mouth daily. 90 tablet 3  . vitamin B-12 (CYANOCOBALAMIN) 1000 MCG tablet Take 500 mcg by mouth daily.       Facility-Administered Medications Prior to Visit  Medication Dose Route Frequency Provider Last Rate Last Dose  . 0.9 %  sodium chloride infusion  500 mL Intravenous Continuous Irene Shipper, MD        ROS Review of Systems  Constitutional: Negative for activity change, appetite change, chills, fatigue and unexpected weight change.  HENT: Negative for congestion, mouth sores and sinus pressure.   Eyes: Negative for visual disturbance.  Respiratory: Negative for cough and chest tightness.     Gastrointestinal: Negative for abdominal pain and nausea.  Genitourinary: Negative for difficulty urinating, frequency and vaginal pain.  Musculoskeletal: Positive for arthralgias. Negative for back pain and gait problem.  Skin: Negative for pallor and rash.  Neurological: Negative for dizziness, tremors, weakness, numbness and headaches.  Psychiatric/Behavioral: Negative for confusion and sleep disturbance.    Objective:  BP 126/72 (BP Location: Right Arm, Patient Position: Sitting, Cuff Size: Large)   Pulse (!) 58   Temp 97.8 F (36.6 C) (Oral)   Ht 5\' 7"  (1.702 m)   Wt 224 lb (101.6 kg)   SpO2 98%   BMI 35.08 kg/m   BP Readings from Last 3 Encounters:  12/28/17 126/72  08/30/17 126/78  07/15/17 138/78    Wt Readings from Last 3 Encounters:  12/28/17 224 lb (101.6 kg)  08/30/17 223 lb (101.2 kg)  07/15/17 222 lb 3.2 oz (100.8 kg)    Physical Exam  Constitutional: She appears well-developed. No distress.  HENT:  Head: Normocephalic.  Right Ear: External ear normal.  Left Ear: External ear normal.  Nose: Nose normal.  Mouth/Throat: Oropharynx is clear and moist.  Eyes: Pupils are equal, round, and reactive to light. Conjunctivae are normal. Right eye exhibits no discharge. Left eye exhibits no discharge.  Neck: Normal range of motion. Neck supple. No JVD present. No tracheal deviation present. No thyromegaly present.  Cardiovascular: Normal rate, regular rhythm and normal  heart sounds.  Pulmonary/Chest: No stridor. No respiratory distress. She has no wheezes.  Abdominal: Soft. Bowel sounds are normal. She exhibits no distension and no mass. There is no tenderness. There is no rebound and no guarding.  Musculoskeletal: She exhibits no edema or tenderness.  Lymphadenopathy:    She has no cervical adenopathy.  Neurological: She displays normal reflexes. No cranial nerve deficit. She exhibits normal muscle tone. Coordination normal.  Skin: No rash noted. No erythema.   Psychiatric: She has a normal mood and affect. Her behavior is normal. Judgment and thought content normal.   Feet - WNL Lab Results  Component Value Date   WBC 3.1 (L) 02/16/2017   HGB 13.6 07/13/2017   HCT 40.0 07/13/2017   PLT 98.0 (L) 02/16/2017   GLUCOSE 89 08/30/2017   CHOL 170 02/16/2017   TRIG 126.0 02/16/2017   HDL 55.80 02/16/2017   LDLDIRECT 139.3 05/31/2012   LDLCALC 89 02/16/2017   ALT 11 02/16/2017   AST 16 02/16/2017   NA 138 08/30/2017   K 3.8 08/30/2017   CL 102 08/30/2017   CREATININE 1.09 08/30/2017   BUN 17 08/30/2017   CO2 30 08/30/2017   TSH 1.02 02/16/2017   INR 1.16 08/16/2009    Dg Foot Complete Right  Result Date: 07/13/2017 CLINICAL DATA:  Pain in the first toe of the right foot radiating into the ball of the right foot tonight. No known injury. EXAM: RIGHT FOOT COMPLETE - 3+ VIEW COMPARISON:  None. FINDINGS: Mild degenerative changes in the first metatarsal-phalangeal joint. Right foot appears otherwise intact. No evidence of acute fracture or subluxation. No focal bone lesion or bone destruction. Bone cortex and trabecular architecture appear intact. No radiopaque soft tissue foreign bodies. IMPRESSION: Mild degenerative changes.  No acute bony abnormalities. Electronically Signed   By: Lucienne Capers M.D.   On: 07/13/2017 02:19    Assessment & Plan:   There are no diagnoses linked to this encounter. I am having Keileigh L. Mitrano maintain her vitamin B-12, triamcinolone ointment, Cholecalciferol, hydrocortisone, traMADol, colchicine, lovastatin, metoprolol tartrate, ranitidine, and triamterene-hydrochlorothiazide. We will continue to administer sodium chloride.  No orders of the defined types were placed in this encounter.    Follow-up: No follow-ups on file.  Walker Kehr, MD

## 2017-12-28 NOTE — Assessment & Plan Note (Signed)
B toes ?etiology Wide shoes Gabapentin B complex vitamin one a day

## 2017-12-28 NOTE — Patient Instructions (Signed)
  Wide shoes Gabapentin B complex vitamin one a day

## 2018-03-01 NOTE — Progress Notes (Addendum)
Subjective:   Jasmin [name] is a 84 y.o. female who presents for Medicare Annual (Subsequent) preventive examination.  Review of Systems:  No ROS.  Medicare Wellness Visit. Additional risk factors are reflected in the social history.  Cardiac Risk Factors include: advanced age (>51men, >82 women);dyslipidemia;hypertension Sleep patterns: feels rested on waking, gets up 1 times nightly to void and sleeps 7-8 hours nightly.    Home Safety/Smoke Alarms: Feels safe in home. Smoke alarms in place.  Living environment; residence and Firearm Safety: 1-story house/ trailer, no firearms. Lives with husband, no needs for DME, good support system Seat Belt Safety/Bike Helmet: Wears seat belt.      Objective:     Vitals: BP 128/76   Pulse 60   Resp 18   Ht 5\' 7"  (1.702 m)   Wt 227 lb (103 kg)   SpO2 98%   BMI 35.55 kg/m   Body mass index is 35.55 kg/m.  Advanced Directives 03/02/2018 07/13/2017 02/28/2017 12/10/2015 09/05/2014 01/15/2014  Does Patient Have a Medical Advance Directive? Yes No No No No Patient does not have advance directive  Type of Scientist, forensic Power of [place];Living will - - - - -  Copy of [place] in Chart? No - copy requested - - - - -  Would patient like information on creating a medical advance directive? - - - Yes - Educational materials given No - patient declined information -    Tobacco Social History   Tobacco Use  Smoking Status Never Smoker  Smokeless Tobacco Never Used     Counseling given: Not Answered  Past Medical History:  Diagnosis Date  . Contact dermatitis and other eczema, due to unspecified cause   . Diverticulosis of colon   . Esophageal stricture   . GERD with stricture    Dr Henrene Pastor  . Hemorrhoids   . Hiatal hernia   . Hyperlipidemia   . Hypertension   . Onychomycosis   . Personal history of urinary calculi   . Polycystic kidney   . Tubulovillous adenoma polyp of colon   . Unspecified  vitamin D deficiency   . Vitamin B12 deficiency 2009   Borderline   Past Surgical History:  Procedure Laterality Date  . CATARACT EXTRACTION Bilateral 2017  . DILATION AND CURETTAGE OF UTERUS  1977  . TOE SURGERY  2012   Right foot   . TONSILLECTOMY     as child   Family History  Problem Relation Age of Onset  . Hypertension Father   . Colon cancer Neg Hx   . Esophageal cancer Neg Hx   . Rectal cancer Neg Hx   . Stomach cancer Neg Hx    Social History   Socioeconomic History  . Marital status: Married    Spouse name: Not on file  . Number of children: 2  . Years of education: Not on file  . Highest education level: Not on file  Occupational History  . Occupation: RETIRED  Social Needs  . Financial resource strain: Not hard at all  . Food insecurity:    Worry: Never true    Inability: Never true  . Transportation needs:    Medical: No    Non-medical: No  Tobacco Use  . Smoking status: Never Smoker  . Smokeless tobacco: Never Used  Substance and Sexual Activity  . Alcohol use: No    Alcohol/week: 0.0 oz  . Drug use: No  . Sexual activity: Yes  Lifestyle  .  Physical activity:    Days per week: 3 days    Minutes per session: 50 min  . Stress: Not on file  Relationships  . Social connections:    Talks on phone: More than three times a week    Gets together: More than three times a week    Attends religious service: More than 4 times per year    Active member of club or organization: Yes    Attends meetings of clubs or organizations: More than 4 times per year    Relationship status: Married  Other Topics Concern  . Not on file  Social History Narrative   GYN: Dr Philis Pique      FAMILY HISTORY HYPERTENSION      Regular exercise - NO    Outpatient Encounter Medications as of 03/02/2018  Medication Sig  . Cholecalciferol 1000 units tablet Take 2 tablets (2,000 Units total) by mouth daily.  . colchicine 0.6 MG tablet TAKE 1 TAB AND REPEAT IN AN HOUR. TAKE  ONE TAB DAILY UNTIL RESOLUTION OF PAIN.  Marland Kitchen gabapentin (NEURONTIN) 100 MG capsule Take 1 capsule (100 mg total) by mouth 2 (two) times daily as needed (neuropathy pains in your toes).  . hydrocortisone (ANUSOL-HC) 25 MG suppository Place 1 suppository (25 mg total) rectally 2 (two) times daily. Reported on 10/06/2015  . lovastatin (MEVACOR) 40 MG tablet Take 1 tablet (40 mg total) by mouth at bedtime.  . metoprolol tartrate (LOPRESSOR) 50 MG tablet Take 1 tablet (50 mg total) by mouth 2 (two) times daily.  . ranitidine (ZANTAC) 150 MG tablet Take 1 tablet (150 mg total) by mouth 2 (two) times daily.  Marland Kitchen triamterene-hydrochlorothiazide (MAXZIDE-25) 37.5-25 MG tablet Take 1 tablet by mouth daily.  . [DISCONTINUED] vitamin B-12 (CYANOCOBALAMIN) 1000 MCG tablet Take 500 mcg by mouth daily.    . [DISCONTINUED] triamcinolone ointment (KENALOG) 0.5 % Apply topically 2 (two) times daily. (Patient not taking: Reported on 03/02/2018)   Facility-Administered Encounter Medications as of 03/02/2018  Medication  . 0.9 %  sodium chloride infusion    Activities of Daily Living In your present state of health, do you have any difficulty performing the following activities: 03/02/2018  Hearing? N  Vision? N  Difficulty concentrating or making decisions? N  Walking or climbing stairs? N  Dressing or bathing? N  Doing errands, shopping? N  Preparing Food and eating ? N  Using the Toilet? N  In the past six months, have you accidently leaked urine? N  Do you have problems with loss of bowel control? N  Managing your Medications? N  Managing your Finances? N  Housekeeping or managing your Housekeeping? N  Some recent data might be hidden    Patient Care Team: Plotnikov, Evie Lacks, MD as PCP - General Henrene Pastor Docia Chuck, MD (Gastroenterology) Janyth Pupa, DO as Consulting Physician (Obstetrics and Gynecology)    Assessment:   This is a routine wellness examination for Jasmin [name]. Physical assessment deferred to  PCP.   Exercise Activities and Dietary recommendations Current Exercise Habits: Structured exercise class, Type of exercise: walking;stretching;calisthenics, Time (Minutes): 40, Frequency (Times/Week): 3, Weekly Exercise (Minutes/Week): 120, Exercise limited by: orthopedic condition(s)  Diet (meal preparation, eat out, water intake, caffeinated beverages, dairy products, fruits and vegetables): in general, a "healthy" diet  , well balanced   Reviewed heart healthy diet. Encouraged patient to increase daily water and fluid intake.  Goals    . Exercise 150 minutes per week (moderate activity)     Continue  with silver sneakers; and may consider zumba or water areobics Try to build up to 5 days a week to help with weight loss Walking counts!!     . Patient Stated     Continue to do the classes at [employer]. I will increase the amount of fluids and water I drink. I plan to buy flavor packets to put in the water.        Fall Risk Fall Risk  03/02/2018 08/30/2017 12/10/2015 01/29/2015  Falls in the past year? No No No No    Depression Screen PHQ 2/9 Scores 03/02/2018 08/30/2017 12/10/2015 01/29/2015  PHQ - 2 Score 0 0 0 0     Cognitive Function MMSE - Mini Mental State Exam 03/02/2018  Orientation to time 5  Orientation to Place 5  Registration 3  Attention/ Calculation 3  Recall 3  Language- name 2 objects 2  Language- repeat 1  Language- follow 3 step command 3  Language- read & follow direction 1  Write a sentence 1  Copy design 1  Total score 28        Immunization History  Administered Date(s) Administered  . Influenza Split 05/20/2011, 05/31/2012  . Influenza Whole 08/24/2005, 06/21/2008, 06/10/2009  . Influenza, High Dose Seasonal PF 06/15/2017  . Influenza,inj,Quad PF,6+ Mos 05/22/2013, 05/29/2014, 06/04/2015, 04/16/2016  . Pneumococcal Conjugate-13 09/21/2013  . Pneumococcal Polysaccharide-23 08/25/2004, 11/12/2009, 05/31/2012  . Td 03/02/2013  . Zoster  Recombinat (Shingrix) 03/02/2017, 06/13/2017   Screening Tests Health Maintenance  Topic Date Due  . INFLUENZA VACCINE  03/23/2018  . COLONOSCOPY  03/14/2020  . TETANUS/TDAP  03/03/2023  . DEXA SCAN  Completed  . PNA vac Low Risk Adult  Completed      Plan:   Continue doing brain stimulating activities (puzzles, reading, adult coloring books, staying active) to keep memory sharp.   Continue to eat heart healthy diet (full of fruits, vegetables, whole grains, lean protein, water--limit salt, fat, and sugar intake) and increase physical activity as tolerated.   I have personally reviewed and noted the following in the patient's chart:   . Medical and social history . Use of alcohol, tobacco or illicit drugs  . Current medications and supplements . Functional ability and status . Nutritional status . Physical activity . Advanced directives . List of other physicians . Vitals . Screenings to include cognitive, depression, and falls . Referrals and appointments  In addition, I have reviewed and discussed with patient certain preventive protocols, quality metrics, and best practice recommendations. A written personalized care plan for preventive services as well as general preventive health recommendations were provided to patient.     Michiel Cowboy, RN  03/02/2018   Medical screening examination/treatment/procedure(s) were performed by non-physician practitioner and as supervising physician I was immediately available for consultation/collaboration. I agree with above. Cathlean Cower, MD

## 2018-03-02 ENCOUNTER — Telehealth: Payer: Self-pay | Admitting: *Deleted

## 2018-03-02 ENCOUNTER — Ambulatory Visit (INDEPENDENT_AMBULATORY_CARE_PROVIDER_SITE_OTHER): Payer: Medicare Other | Admitting: *Deleted

## 2018-03-02 VITALS — BP 128/76 | HR 60 | Resp 18 | Ht 67.0 in | Wt 227.0 lb

## 2018-03-02 DIAGNOSIS — Z0000 Encounter for general adult medical examination without abnormal findings: Secondary | ICD-10-CM | POA: Diagnosis not present

## 2018-03-02 NOTE — Patient Instructions (Addendum)
Continue doing brain stimulating activities (puzzles, reading, adult coloring books, staying active) to keep memory sharp.   Continue to eat heart healthy diet (full of fruits, vegetables, whole grains, lean protein, water--limit salt, fat, and sugar intake) and increase physical activity as tolerated.   Jasmin [name] , Thank you for taking time to come for your Medicare Wellness Visit. I appreciate your ongoing commitment to your health goals. Please review the following plan we discussed and let me know if I can assist you in the future.   These are the goals we discussed: Goals    . Exercise 150 minutes per week (moderate activity)     Continue with silver sneakers; and may consider zumba or water areobics Try to build up to 5 days a week to help with weight loss Walking counts!!     . Patient Stated     Continue to do the classes at [employer]. I will increase the amount of fluids and water I drink. I plan to buy flavor packets to put in the water.        This is a list of the screening recommended for you and due dates:  Health Maintenance  Topic Date Due  . Flu Shot  03/23/2018  . Colon Cancer Screening  03/14/2020  . Tetanus Vaccine  03/03/2023  . DEXA scan (bone density measurement)  Completed  . Pneumonia vaccines  Completed     Low-Purine Diet Purines are compounds that affect the level of uric acid in your body. A low-purine diet is a diet that is low in purines. Eating a low-purine diet can prevent the level of uric acid in your body from getting too high and causing gout or kidney stones or both. What do I need to know about this diet?  Choose low-purine foods. Examples of low-purine foods are listed in the next section.  Drink plenty of fluids, especially water. Fluids can help remove uric acid from your body. Try to drink 8-16 cups (1.9-3.8 L) a day.  Limit foods high in fat, especially saturated fat, as fat makes it harder for the body to get rid of uric  acid. Foods high in saturated fat include pizza, cheese, ice cream, whole milk, fried foods, and gravies. Choose foods that are lower in fat and lean sources of protein. Use olive oil when cooking as it contains healthy fats that are not high in saturated fat.  Limit alcohol. Alcohol interferes with the elimination of uric acid from your body. If you are having a gout attack, avoid all alcohol.  Keep in mind that different people's bodies react differently to different foods. You will probably learn over time which foods do or do not affect you. If you discover that a food tends to cause your gout to flare up, avoid eating that food. You can more freely enjoy foods that do not cause problems. If you have any questions about a food item, talk to your dietitian or health care provider. Which foods are low, moderate, and high in purines? The following is a list of foods that are low, moderate, and high in purines. You can eat any amount of the foods that are low in purines. You may be able to have small amounts of foods that are moderate in purines. Ask your health care provider how much of a food moderate in purines you can have. Avoid foods high in purines. Grains  Foods low in purines: Enriched white bread, pasta, rice, cake, cornbread, popcorn.  Foods moderate in purines: Whole-grain breads and cereals, wheat germ, bran, oatmeal. Uncooked oatmeal. Dry wheat bran or wheat germ.  Foods high in purines: Pancakes, [place] toast, biscuits, muffins. Vegetables  Foods low in purines: All vegetables, except those that are moderate in purines.  Foods moderate in purines: Asparagus, cauliflower, spinach, mushrooms, green peas. Fruits  All fruits are low in purines. Meats and other Protein Foods  Foods low in purines: Eggs, nuts, peanut butter.  Foods moderate in purines: 80-90% lean beef, lamb, veal, pork, poultry, fish, eggs, peanut butter, nuts. Crab, lobster, oysters, and shrimp. Cooked dried  beans, peas, and lentils.  Foods high in purines: Anchovies, sardines, herring, mussels, tuna, codfish, scallops, trout, and haddock. Jasmin [name]. Organ meats (such as liver or kidney). Tripe. Game meat. Goose. Sweetbreads. Dairy  All dairy foods are low in purines. Low-fat and fat-free dairy products are best because they are low in saturated fat. Beverages  Drinks low in purines: Water, carbonated beverages, tea, coffee, cocoa.  Drinks moderate in purines: Soft drinks and other drinks sweetened with high-fructose corn syrup. Juices. To find whether a food or drink is sweetened with high-fructose corn syrup, look at the ingredients list.  Drinks high in purines: Alcoholic beverages (such as beer). Condiments  Foods low in purines: Salt, herbs, olives, pickles, relishes, vinegar.  Foods moderate in purines: Butter, margarine, oils, mayonnaise. Fats and Oils  Foods low in purines: All types, except gravies and sauces made with meat.  Foods high in purines: Gravies and sauces made with meat. Other Foods  Foods low in purines: Sugars, sweets, gelatin. Cake. Soups made without meat.  Foods moderate in purines: Meat-based or fish-based soups, broths, or bouillons. Foods and drinks sweetened with high-fructose corn syrup.  Foods high in purines: High-fat desserts (such as ice cream, cookies, cakes, pies, doughnuts, and chocolate). Contact your dietitian for more information on foods that are not listed here. This information is not intended to replace advice given to you by your health care provider. Make sure you discuss any questions you have with your health care provider. Document Released: 12/04/2010 Document Revised: 01/15/2016 Document Reviewed: 07/16/2013 Elsevier Interactive Patient Education  2017 [place] Maintenance, Female Adopting a healthy lifestyle and getting preventive care can go a long way to promote health and wellness. Talk with your health care provider  about what schedule of regular examinations is right for you. This is a good chance for you to check in with your provider about disease prevention and staying healthy. In between checkups, there are plenty of things you can do on your own. Experts have done a lot of research about which lifestyle changes and preventive measures are most likely to keep you healthy. Ask your health care provider for more information. Weight and diet Eat a healthy diet  Be sure to include plenty of vegetables, fruits, low-fat dairy products, and lean protein.  Do not eat a lot of foods high in solid fats, added sugars, or salt.  Get regular exercise. This is one of the most important things you can do for your health. ? Most adults should exercise for at least 150 minutes each week. The exercise should increase your heart rate and make you sweat (moderate-intensity exercise). ? Most adults should also do strengthening exercises at least twice a week. This is in addition to the moderate-intensity exercise.  Maintain a healthy weight  Body mass index (BMI) is a measurement that can be used to identify possible weight problems. It estimates  body fat based on height and weight. Your health care provider can help determine your BMI and help you achieve or maintain a healthy weight.  For females 32 years of age and older: ? A BMI below 18.5 is considered underweight. ? A BMI of 18.5 to 24.9 is normal. ? A BMI of 25 to 29.9 is considered overweight. ? A BMI of 30 and above is considered obese.  Watch levels of cholesterol and blood lipids  You should start having your blood tested for lipids and cholesterol at 82 years of age, then have this test every 5 years.  You may need to have your cholesterol levels checked more often if: ? Your lipid or cholesterol levels are high. ? You are older than 82 years of age. ? You are at high risk for heart disease.  Cancer screening Lung Cancer  Lung cancer screening is  recommended for adults 31-25 years old who are at high risk for lung cancer because of a history of smoking.  A yearly low-dose CT scan of the lungs is recommended for people who: ? Currently smoke. ? Have quit within the past 15 years. ? Have at least a 30-pack-year history of smoking. A pack year is smoking an average of one pack of cigarettes a day for 1 year.  Yearly screening should continue until it has been 15 years since you quit.  Yearly screening should stop if you develop a health problem that would prevent you from having lung cancer treatment.  Breast Cancer  Practice breast self-awareness. This means understanding how your breasts normally appear and feel.  It also means doing regular breast self-exams. Let your health care provider know about any changes, no matter how small.  If you are in your 20s or 30s, you should have a clinical breast exam (CBE) by a health care provider every 1-3 years as part of a regular health exam.  If you are 41 or older, have a CBE every year. Also consider having a breast X-ray (mammogram) every year.  If you have a family history of breast cancer, talk to your health care provider about genetic screening.  If you are at high risk for breast cancer, talk to your health care provider about having an MRI and a mammogram every year.  Breast cancer gene (BRCA) assessment is recommended for women who have family members with BRCA-related cancers. BRCA-related cancers include: ? Breast. ? Ovarian. ? Tubal. ? Peritoneal cancers.  Results of the assessment will determine the need for genetic counseling and BRCA1 and BRCA2 testing.  Cervical Cancer Your health care provider may recommend that you be screened regularly for cancer of the pelvic organs (ovaries, uterus, and vagina). This screening involves a pelvic examination, including checking for microscopic changes to the surface of your cervix (Pap test). You may be encouraged to have this  screening done every 3 years, beginning at age 65.  For women ages 41-65, health care providers may recommend pelvic exams and Pap testing every 3 years, or they may recommend the Pap and pelvic exam, combined with testing for human papilloma virus (HPV), every 5 years. Some types of HPV increase your risk of cervical cancer. Testing for HPV may also be done on women of any age with unclear Pap test results.  Other health care providers may not recommend any screening for nonpregnant women who are considered low risk for pelvic cancer and who do not have symptoms. Ask your health care provider if a screening pelvic  exam is right for you.  If you have had past treatment for cervical cancer or a condition that could lead to cancer, you need Pap tests and screening for cancer for at least 20 years after your treatment. If Pap tests have been discontinued, your risk factors (such as having a new sexual partner) need to be reassessed to determine if screening should resume. Some women have medical problems that increase the chance of getting cervical cancer. In these cases, your health care provider may recommend more frequent screening and Pap tests.  Colorectal Cancer  This type of cancer can be detected and often prevented.  Routine colorectal cancer screening usually begins at 82 years of age and continues through 82 years of age.  Your health care provider may recommend screening at an earlier age if you have risk factors for colon cancer.  Your health care provider may also recommend using home test kits to check for hidden blood in the stool.  A small camera at the end of a tube can be used to examine your colon directly (sigmoidoscopy or colonoscopy). This is done to check for the earliest forms of colorectal cancer.  Routine screening usually begins at age 18.  Direct examination of the colon should be repeated every 5-10 years through 82 years of age. However, you may need to be screened  more often if early forms of precancerous polyps or small growths are found.  Skin Cancer  Check your skin from head to toe regularly.  Tell your health care provider about any new moles or changes in moles, especially if there is a change in a mole's shape or color.  Also tell your health care provider if you have a mole that is larger than the size of a pencil eraser.  Always use sunscreen. Apply sunscreen liberally and repeatedly throughout the day.  Protect yourself by wearing long sleeves, pants, a wide-brimmed hat, and sunglasses whenever you are outside.  Heart disease, diabetes, and high blood pressure  High blood pressure causes heart disease and increases the risk of stroke. High blood pressure is more likely to develop in: ? People who have blood pressure in the high end of the normal range (130-139/85-89 mm Hg). ? People who are overweight or obese. ? People who are African American.  If you are 58-65 years of age, have your blood pressure checked every 3-5 years. If you are 72 years of age or older, have your blood pressure checked every year. You should have your blood pressure measured twice-once when you are at a hospital or clinic, and once when you are not at a hospital or clinic. Record the average of the two measurements. To check your blood pressure when you are not at a hospital or clinic, you can use: ? An automated blood pressure machine at a pharmacy. ? A home blood pressure monitor.  If you are between 60 years and 23 years old, ask your health care provider if you should take aspirin to prevent strokes.  Have regular diabetes screenings. This involves taking a blood sample to check your fasting blood sugar level. ? If you are at a normal weight and have a low risk for diabetes, have this test once every three years after 82 years of age. ? If you are overweight and have a high risk for diabetes, consider being tested at a younger age or more often. Preventing  infection Hepatitis B  If you have a higher risk for hepatitis B, you should  be screened for this virus. You are considered at high risk for hepatitis B if: ? You were born in a country where hepatitis B is common. Ask your health care provider which countries are considered high risk. ? Your parents were born in a high-risk country, and you have not been immunized against hepatitis B (hepatitis B vaccine). ? You have HIV or AIDS. ? You use needles to inject street drugs. ? You live with someone who has hepatitis B. ? You have had sex with someone who has hepatitis B. ? You get hemodialysis treatment. ? You take certain medicines for conditions, including cancer, organ transplantation, and autoimmune conditions.  Hepatitis C  Blood testing is recommended for: ? Everyone born from [birth year] through 1963. ? Anyone with known risk factors for hepatitis C.  Sexually transmitted infections (STIs)  You should be screened for sexually transmitted infections (STIs) including gonorrhea and chlamydia if: ? You are sexually active and are younger than 82 years of age. ? You are older than 82 years of age and your health care provider tells you that you are at risk for this type of infection. ? Your sexual activity has changed since you were last screened and you are at an increased risk for chlamydia or gonorrhea. Ask your health care provider if you are at risk.  If you do not have HIV, but are at risk, it may be recommended that you take a prescription medicine daily to prevent HIV infection. This is called pre-exposure prophylaxis (PrEP). You are considered at risk if: ? You are sexually active and do not regularly use condoms or know the HIV status of your partner(s). ? You take drugs by injection. ? You are sexually active with a partner who has HIV.  Talk with your health care provider about whether you are at high risk of being infected with HIV. If you choose to begin PrEP, you should first be  tested for HIV. You should then be tested every 3 months for as long as you are taking PrEP. Pregnancy  If you are premenopausal and you may become pregnant, ask your health care provider about preconception counseling.  If you may become pregnant, take 400 to 800 micrograms (mcg) of folic acid every day.  If you want to prevent pregnancy, talk to your health care provider about birth control (contraception). Osteoporosis and menopause  Osteoporosis is a disease in which the bones lose minerals and strength with aging. This can result in serious bone fractures. Your risk for osteoporosis can be identified using a bone density scan.  If you are 52 years of age or older, or if you are at risk for osteoporosis and fractures, ask your health care provider if you should be screened.  Ask your health care provider whether you should take a calcium or vitamin D supplement to lower your risk for osteoporosis.  Menopause may have certain physical symptoms and risks.  Hormone replacement therapy may reduce some of these symptoms and risks. Talk to your health care provider about whether hormone replacement therapy is right for you. Follow these instructions at home:  Schedule regular health, dental, and eye exams.  Stay current with your immunizations.  Do not use any tobacco products including cigarettes, chewing tobacco, or electronic cigarettes.  If you are pregnant, do not drink alcohol.  If you are breastfeeding, limit how much and how often you drink alcohol.  Limit alcohol intake to no more than 1 drink per day for nonpregnant women.  One drink equals 12 ounces of beer, 5 ounces of wine, or 1 ounces of hard liquor.  Do not use street drugs.  Do not share needles.  Ask your health care provider for help if you need support or information about quitting drugs.  Tell your health care provider if you often feel depressed.  Tell your health care provider if you have ever been abused  or do not feel safe at home. This information is not intended to replace advice given to you by your health care provider. Make sure you discuss any questions you have with your health care provider. Document Released: 02/22/2011 Document Revised: 01/15/2016 Document Reviewed: 05/13/2015 Elsevier Interactive Patient Education  [employer]. It is important to avoid accidents which may result in broken bones.  Here are a few ideas on how to make your home safer so you will be less likely to trip or fall.  1. Use nonskid mats or non slip strips in your shower or tub, on your bathroom floor and around sinks.  If you know that you have spilled water, wipe it up! 2. In the bathroom, it is important to have properly installed grab bars on the walls or on the edge of the tub.  Towel racks are NOT strong enough for you to hold onto or to pull on for support. 3. Stairs and hallways should have enough light.  Add lamps or night lights if you need ore light. 4. It is good to have handrails on both sides of the stairs if possible.  Always fix broken handrails right away. 5. It is important to see the edges of steps.  Paint the edges of outdoor steps white so you can see them better.  Put colored tape on the edge of inside steps. 6. Throw-rugs are dangerous because they can slide.  Removing the rugs is the best idea, but if they must stay, add adhesive carpet tape to prevent slipping. 7. Do not keep things on stairs or in the halls.  Remove small furniture that blocks the halls as it may cause you to trip.  Keep telephone and electrical cords out of the way where you walk. 8. Always were sturdy, rubber-soled shoes for good support.  Never wear just socks, especially on the stairs.  Socks may cause you to slip or fall.  Do not wear full-length housecoats as you can easily trip on the bottom.  9. Place the things you use the most on the shelves that are the easiest to reach.  If you use a stepstool, make sure  it is in good condition.  If you feel unsteady, DO NOT climb, ask for help. 10. If a health professional advises you to use a cane or walker, do not be ashamed.  These items can keep you from falling and breaking your bones.

## 2018-03-02 NOTE — Telephone Encounter (Signed)
During AWV, patient stated that her prescription gabapentin causes her to feel a little dizzy and unsteady. She also thinks it is causing her to gain weight. Patient asked if PCP could prescribe something else and wants to know PCP's recommendation for stopping gabapentin medication as she read you cannot stop the drug abruptly.

## 2018-03-02 NOTE — Telephone Encounter (Signed)
Jasmin [name], She can try it qhs only or stop it completely and f/u w/me Thx

## 2018-03-13 NOTE — Telephone Encounter (Signed)
Called patient to inform her to take gabapentin at night. Patient states that she is now tolerating the medication and she feels better after she started to drink more water and started taking vitamins. She will continue to take gabapentin x 2 daily and will discuss the medication with PCP during upcoming appointment in August.

## 2018-03-30 ENCOUNTER — Ambulatory Visit: Payer: Self-pay | Admitting: Internal Medicine

## 2018-03-30 DIAGNOSIS — S0003XA Contusion of scalp, initial encounter: Secondary | ICD-10-CM | POA: Diagnosis not present

## 2018-03-30 DIAGNOSIS — Y998 Other external cause status: Secondary | ICD-10-CM | POA: Diagnosis not present

## 2018-03-30 DIAGNOSIS — W07XXXA Fall from chair, initial encounter: Secondary | ICD-10-CM | POA: Diagnosis not present

## 2018-03-30 DIAGNOSIS — R51 Headache: Secondary | ICD-10-CM | POA: Diagnosis not present

## 2018-03-30 NOTE — Telephone Encounter (Signed)
Patient was at appointment with husband yesterday and fell to floor when went to sit- she hit her head on the floor. Patient states she has some dizziness today and reports a slight headache. Due to nature of injury-patient advised to go to ED for evaluation of head.  Reason for Disposition . Dangerous injury (e.g., MVA, diving, trampoline, contact sports, fall > 10 feet or 3 meters) or severe blow from hard object (e.g., golf club or baseball bat)  Answer Assessment - Initial Assessment Questions 1. MECHANISM: "How did the injury happen?" For falls, ask: "What height did you fall from?" and "What surface did you fall against?"      Chair rolled out from under her yesterday and patient fell to floor- patient hit head on the floor 2. ONSET: "When did the injury happen?" (Minutes or hours ago)      Yesterday- morning 3. NEUROLOGIC SYMPTOMS: "Was there any loss of consciousness?" "Are there any other neurological symptoms?"      No loss of consciousness, dizziness this morning 4. MENTAL STATUS: "Does the person know who he is, who you are, and where he is?"      Patient is alert 5. LOCATION: "What part of the head was hit?"      Back of head 6. SCALP APPEARANCE: "What does the scalp look like? Is it bleeding now?" If so, ask: "Is it difficult to stop?"      No cuts or bruising visibile  7. SIZE: For cuts, bruises, or swelling, ask: "How large is it?" (e.g., inches or centimeters)      no 8. PAIN: "Is there any pain?" If so, ask: "How bad is it?"  (e.g., Scale 1-10; or mild, moderate, severe)     Sore to touch       5 9. TETANUS: For any breaks in the skin, ask: "When was the last tetanus booster?"     n/a 10. OTHER SYMPTOMS: "Do you have any other symptoms?" (e.g., neck pain, vomiting)       Slight dizziness this morning 11. PREGNANCY: "Is there any chance you are pregnant?" "When was your last menstrual period?"       n/a  Protocols used: HEAD INJURY-A-AH

## 2018-03-30 NOTE — Telephone Encounter (Signed)
FYI

## 2018-04-03 ENCOUNTER — Ambulatory Visit (INDEPENDENT_AMBULATORY_CARE_PROVIDER_SITE_OTHER): Payer: Medicare Other | Admitting: Internal Medicine

## 2018-04-03 ENCOUNTER — Encounter: Payer: Self-pay | Admitting: Internal Medicine

## 2018-04-03 DIAGNOSIS — Z6835 Body mass index (BMI) 35.0-35.9, adult: Secondary | ICD-10-CM

## 2018-04-03 DIAGNOSIS — I10 Essential (primary) hypertension: Secondary | ICD-10-CM | POA: Diagnosis not present

## 2018-04-03 DIAGNOSIS — M109 Gout, unspecified: Secondary | ICD-10-CM

## 2018-04-03 DIAGNOSIS — E538 Deficiency of other specified B group vitamins: Secondary | ICD-10-CM

## 2018-04-03 DIAGNOSIS — E559 Vitamin D deficiency, unspecified: Secondary | ICD-10-CM

## 2018-04-03 MED ORDER — GABAPENTIN 100 MG PO CAPS: 100.0000 mg | ORAL_CAPSULE | Freq: Every day | ORAL | 3 refills | Status: DC

## 2018-04-03 MED ORDER — GABAPENTIN 100 MG PO CAPS: 100.0000 mg | ORAL_CAPSULE | Freq: Every day | ORAL | 11 refills | Status: DC

## 2018-04-03 NOTE — Assessment & Plan Note (Signed)
On B complex 

## 2018-04-03 NOTE — Assessment & Plan Note (Signed)
Wt gain: reduce Gabapentin to 1 a day at night

## 2018-04-03 NOTE — Assessment & Plan Note (Signed)
Colchicine

## 2018-04-03 NOTE — Assessment & Plan Note (Signed)
Vit D 

## 2018-04-03 NOTE — Progress Notes (Signed)
Subjective:  Patient ID: Jasmin [name], female    DOB: Jul 11, 1934  Age: 84 y.o. MRN: [id number]  CC: No chief complaint on file.   HPI Carollee Nussbaumer Aul presents for neuropathy f/u, gout, HTN. C/o wt gain on gabapentin Pt fell at the dr's office last wk - ER visit, neg head CT, no LOC  Outpatient Medications Prior to Visit  Medication Sig Dispense Refill  . Cholecalciferol 1000 units tablet Take 2 tablets (2,000 Units total) by mouth daily. 100 tablet 5  . colchicine 0.6 MG tablet TAKE 1 TAB AND REPEAT IN AN HOUR. TAKE ONE TAB DAILY UNTIL RESOLUTION OF PAIN. 30 tablet 0  . gabapentin (NEURONTIN) 100 MG capsule Take 1 capsule (100 mg total) by mouth 2 (two) times daily as needed (neuropathy pains in your toes). 60 capsule 3  . hydrocortisone (ANUSOL-HC) 25 MG suppository Place 1 suppository (25 mg total) rectally 2 (two) times daily. Reported on 10/06/2015 12 suppository 0  . lovastatin (MEVACOR) 40 MG tablet Take 1 tablet (40 mg total) by mouth at bedtime. 90 tablet 3  . metoprolol tartrate (LOPRESSOR) 50 MG tablet Take 1 tablet (50 mg total) by mouth 2 (two) times daily. 180 tablet 3  . ranitidine (ZANTAC) 150 MG tablet Take 1 tablet (150 mg total) by mouth 2 (two) times daily. 180 tablet 3  . triamterene-hydrochlorothiazide (MAXZIDE-25) 37.5-25 MG tablet Take 1 tablet by mouth daily. 90 tablet 3   No facility-administered medications prior to visit.     ROS: Review of Systems  Constitutional: Positive for unexpected weight change. Negative for activity change, appetite change, chills and fatigue.  HENT: Negative for congestion, mouth sores and sinus pressure.   Eyes: Negative for visual disturbance.  Respiratory: Negative for cough and chest tightness.   Gastrointestinal: Negative for abdominal pain and nausea.  Genitourinary: Negative for difficulty urinating, frequency and vaginal pain.  Musculoskeletal: Negative for back pain and gait problem.  Skin: Negative for pallor  and rash.  Neurological: Positive for numbness. Negative for dizziness, tremors, weakness and headaches.  Psychiatric/Behavioral: Negative for confusion and sleep disturbance.    Objective:  BP 126/74 (BP Location: Right Arm, Patient Position: Sitting, Cuff Size: Large)   Pulse 65   Temp 98.1 F (36.7 C) (Oral)   Ht 5\' 7"  (1.702 m)   Wt 228 lb (103.4 kg)   SpO2 97%   BMI 35.71 kg/m   BP Readings from Last 3 Encounters:  04/03/18 126/74  03/02/18 128/76  12/28/17 126/72    Wt Readings from Last 3 Encounters:  04/03/18 228 lb (103.4 kg)  03/02/18 227 lb (103 kg)  12/28/17 224 lb (101.6 kg)    Physical Exam  Constitutional: She appears well-developed. No distress.  HENT:  Head: Normocephalic.  Right Ear: External ear normal.  Left Ear: External ear normal.  Nose: Nose normal.  Mouth/Throat: Oropharynx is clear and moist.  Eyes: Pupils are equal, round, and reactive to light. Conjunctivae are normal. Right eye exhibits no discharge. Left eye exhibits no discharge.  Neck: Normal range of motion. Neck supple. No JVD present. No tracheal deviation present. No thyromegaly present.  Cardiovascular: Normal rate, regular rhythm and normal heart sounds.  Pulmonary/Chest: No stridor. No respiratory distress. She has no wheezes.  Abdominal: Soft. Bowel sounds are normal. She exhibits no distension and no mass. There is no tenderness. There is no rebound and no guarding.  Musculoskeletal: She exhibits no edema or tenderness.  Lymphadenopathy:    She has no  cervical adenopathy.  Neurological: She displays normal reflexes. No cranial nerve deficit. She exhibits normal muscle tone. Coordination normal.  Skin: No rash noted. No erythema.  Psychiatric: She has a normal mood and affect. Her behavior is normal. Judgment and thought content normal.  Obese  Lab Results  Component Value Date   WBC 3.1 (L) 02/16/2017   HGB 13.6 07/13/2017   HCT 40.0 07/13/2017   PLT 98.0 (L) 02/16/2017    GLUCOSE 89 08/30/2017   CHOL 170 02/16/2017   TRIG 126.0 02/16/2017   HDL 55.80 02/16/2017   LDLDIRECT 139.3 05/31/2012   LDLCALC 89 02/16/2017   ALT 11 02/16/2017   AST 16 02/16/2017   NA 138 08/30/2017   K 3.8 08/30/2017   CL 102 08/30/2017   CREATININE 1.09 08/30/2017   BUN 17 08/30/2017   CO2 30 08/30/2017   TSH 1.02 02/16/2017   INR 1.16 08/16/2009    Dg Foot Complete Right  Result Date: 07/13/2017 CLINICAL DATA:  Pain in the first toe of the right foot radiating into the ball of the right foot tonight. No known injury. EXAM: RIGHT FOOT COMPLETE - 3+ VIEW COMPARISON:  None. FINDINGS: Mild degenerative changes in the first metatarsal-phalangeal joint. Right foot appears otherwise intact. No evidence of acute fracture or subluxation. No focal bone lesion or bone destruction. Bone cortex and trabecular architecture appear intact. No radiopaque soft tissue foreign bodies. IMPRESSION: Mild degenerative changes.  No acute bony abnormalities. Electronically Signed   By: Lucienne Capers M.D.   On: 07/13/2017 02:19    Assessment & Plan:   There are no diagnoses linked to this encounter.   No orders of the defined types were placed in this encounter.    Follow-up: No follow-ups on file.  Walker Kehr, MD

## 2018-04-03 NOTE — Assessment & Plan Note (Signed)
Metoprolol, Maxzide

## 2018-04-04 ENCOUNTER — Other Ambulatory Visit (INDEPENDENT_AMBULATORY_CARE_PROVIDER_SITE_OTHER): Payer: Medicare Other

## 2018-04-04 DIAGNOSIS — I10 Essential (primary) hypertension: Secondary | ICD-10-CM

## 2018-04-04 LAB — BASIC METABOLIC PANEL
BUN: 24 mg/dL — ABNORMAL HIGH (ref 6–23)
CALCIUM: 10.1 mg/dL (ref 8.4–10.5)
CO2: 29 mEq/L (ref 19–32)
Chloride: 102 mEq/L (ref 96–112)
Creatinine, Ser: 1.35 mg/dL — ABNORMAL HIGH (ref 0.40–1.20)
GFR: 48.24 mL/min — AB (ref >60.00)
GLUCOSE: 106 mg/dL — AB (ref 70–99)
Potassium: 3.8 mEq/L (ref 3.5–5.1)
SODIUM: 139 meq/L (ref 135–145)

## 2018-06-06 ENCOUNTER — Telehealth: Payer: Self-pay

## 2018-06-06 NOTE — Telephone Encounter (Signed)
Patient stopped by the office and stated the Gabapentin 100mg  is to strong for her and is causing her to go to the bathroom excessively. Please advise about an alternate medication in Dr. Enis Slipper absence.

## 2018-06-06 NOTE — Telephone Encounter (Signed)
This isn't emergent; she can hold the Neurontin and schedule a follow-up with him to discuss alternatives.

## 2018-06-07 ENCOUNTER — Ambulatory Visit (HOSPITAL_COMMUNITY)
Admission: EM | Admit: 2018-06-07 | Discharge: 2018-06-07 | Disposition: A | Discharge status: Home / Self Care | Payer: Medicare Other | Attending: Family Medicine | Admitting: Family Medicine

## 2018-06-07 ENCOUNTER — Encounter (HOSPITAL_COMMUNITY): Payer: Self-pay

## 2018-06-07 DIAGNOSIS — M109 Gout, unspecified: Secondary | ICD-10-CM

## 2018-06-07 MED ORDER — COLCHICINE 0.6 MG PO TABS: ORAL_TABLET | ORAL | 0 refills | Status: DC
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
## 2018-06-07 NOTE — ED Triage Notes (Signed)
Pt presents with gout in right foot.

## 2018-06-07 NOTE — Telephone Encounter (Signed)
Pt notified and states she will wait until her appt in February to discuss with Dr. Alain Marion.

## 2018-06-07 NOTE — Discharge Instructions (Addendum)
It was nice meeting you!!  Appears that your gout is getting better with the colchicine. You can 1 colchicine daily until symptoms improve.  If you have continued pain later today or tomorrow you can take Tylenol along with the colchicine Follow-up with your primary care as needed.

## 2018-07-01 ENCOUNTER — Emergency Department (HOSPITAL_COMMUNITY)
Admission: EM | Admit: 2018-07-01 | Discharge: 2018-07-01 | Disposition: A | Discharge status: Home / Self Care | Payer: Medicare Other | Attending: Emergency Medicine | Admitting: Emergency Medicine

## 2018-07-01 ENCOUNTER — Encounter (HOSPITAL_COMMUNITY): Payer: Self-pay | Admitting: Emergency Medicine

## 2018-07-01 DIAGNOSIS — I10 Essential (primary) hypertension: Secondary | ICD-10-CM | POA: Diagnosis not present

## 2018-07-01 DIAGNOSIS — K5909 Other constipation: Secondary | ICD-10-CM

## 2018-07-01 DIAGNOSIS — K5900 Constipation, unspecified: Secondary | ICD-10-CM | POA: Insufficient documentation

## 2018-07-01 DIAGNOSIS — Z79899 Other long term (current) drug therapy: Secondary | ICD-10-CM | POA: Insufficient documentation

## 2018-07-01 DIAGNOSIS — K6289 Other specified diseases of anus and rectum: Secondary | ICD-10-CM

## 2018-07-01 MED ORDER — LIDOCAINE HCL URETHRAL/MUCOSAL 2 % EX GEL
1.0000 "application " | Freq: Once | CUTANEOUS | Status: AC
  Administered 2018-07-01: 1 via TOPICAL
  Filled 2018-07-01: qty 20

## 2018-07-01 NOTE — Discharge Instructions (Signed)
TAKE 8 CAPFULS OF MIRALAX IN A 32 OUNCE GATORADE AND DRINK THE WHOLE BEVERAGE  ° °
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
## 2018-07-01 NOTE — ED Triage Notes (Signed)
  Patient comes in with abdominal pain and rectal bleeding.  Patient woke up around 0400 and tried to have a bowel movement and was unable to urinate.  Patient took a suppository and was able to pass some stool but patient stated it had blood in it.  Patient has hx of constipation and colon polyps.  Pain 8/10.  VSS. Patient A&O x4.

## 2018-07-04 DIAGNOSIS — Z961 Presence of intraocular lens: Secondary | ICD-10-CM | POA: Diagnosis not present

## 2018-07-04 DIAGNOSIS — H04123 Dry eye syndrome of bilateral lacrimal glands: Secondary | ICD-10-CM | POA: Diagnosis not present

## 2018-07-04 DIAGNOSIS — H02834 Dermatochalasis of left upper eyelid: Secondary | ICD-10-CM | POA: Diagnosis not present

## 2018-07-04 DIAGNOSIS — H02831 Dermatochalasis of right upper eyelid: Secondary | ICD-10-CM | POA: Diagnosis not present

## 2018-07-06 ENCOUNTER — Ambulatory Visit (INDEPENDENT_AMBULATORY_CARE_PROVIDER_SITE_OTHER): Payer: Medicare Other | Admitting: Internal Medicine

## 2018-07-06 ENCOUNTER — Encounter: Payer: Self-pay | Admitting: Internal Medicine

## 2018-07-06 DIAGNOSIS — K5901 Slow transit constipation: Secondary | ICD-10-CM

## 2018-07-06 DIAGNOSIS — K648 Other hemorrhoids: Secondary | ICD-10-CM | POA: Diagnosis not present

## 2018-07-06 DIAGNOSIS — E538 Deficiency of other specified B group vitamins: Secondary | ICD-10-CM

## 2018-07-06 DIAGNOSIS — K5900 Constipation, unspecified: Secondary | ICD-10-CM | POA: Insufficient documentation

## 2018-07-06 MED ORDER — HYDROCORTISONE ACETATE 25 MG RE SUPP: 25.0000 mg | Freq: Two times a day (BID) | RECTAL | 1 refills | Status: DC

## 2018-07-06 MED ORDER — POLYETHYLENE GLYCOL 3350 17 GM/SCOOP PO POWD
17.0000 g | Freq: Two times a day (BID) | ORAL | 3 refills | Status: AC | PRN
Start: 2018-07-06 — End: ?

## 2018-07-06 NOTE — Progress Notes (Signed)
Subjective:  Patient ID: Jasmin [name], female    DOB: Nov 12, 1933  Age: 84 y.o. MRN: [id number]  CC: No chief complaint on file.   HPI Jasmin [name] presents for ER visit f/u - 07/01/18 - constipation and hemorrhoids. Doing well on miralax  Outpatient Medications Prior to Visit  Medication Sig Dispense Refill  . Cholecalciferol 1000 units tablet Take 2 tablets (2,000 Units total) by mouth daily. 100 tablet 5  . colchicine 0.6 MG tablet Take one tab , then another tab in an hour if needed. Then may take one tab daily until pain is relieved. 30 tablet 0  . hydrocortisone (ANUSOL-HC) 25 MG suppository Place 1 suppository (25 mg total) rectally 2 (two) times daily. Reported on 10/06/2015 12 suppository 0  . lovastatin (MEVACOR) 40 MG tablet Take 1 tablet (40 mg total) by mouth at bedtime. 90 tablet 3  . metoprolol tartrate (LOPRESSOR) 50 MG tablet Take 1 tablet (50 mg total) by mouth 2 (two) times daily. 180 tablet 3  . ranitidine (ZANTAC) 150 MG tablet Take 1 tablet (150 mg total) by mouth 2 (two) times daily. 180 tablet 3  . triamterene-hydrochlorothiazide (MAXZIDE-25) 37.5-25 MG tablet Take 1 tablet by mouth daily. 90 tablet 3  . gabapentin (NEURONTIN) 100 MG capsule Take 1 capsule (100 mg total) by mouth at bedtime. (Patient not taking: Reported on 07/06/2018) 90 capsule 3   No facility-administered medications prior to visit.     ROS: Review of Systems  Constitutional: Negative for activity change, appetite change, chills, fatigue and unexpected weight change.  HENT: Negative for congestion, mouth sores and sinus pressure.   Eyes: Negative for visual disturbance.  Respiratory: Negative for cough and chest tightness.   Gastrointestinal: Positive for constipation. Negative for abdominal pain, blood in stool and nausea.  Genitourinary: Negative for difficulty urinating, frequency and vaginal pain.  Musculoskeletal: Negative for back pain and gait problem.  Skin: Negative for  pallor and rash.  Neurological: Negative for dizziness, tremors, weakness, numbness and headaches.  Psychiatric/Behavioral: Negative for confusion and sleep disturbance.    Objective:  BP 132/74 (BP Location: Left Arm, Patient Position: Sitting, Cuff Size: Normal)   Pulse (!) 55   Temp 98.2 F (36.8 C) (Oral)   Ht 5\' 6"  (1.676 m)   Wt 224 lb (101.6 kg)   SpO2 97%   BMI 36.15 kg/m   BP Readings from Last 3 Encounters:  07/06/18 132/74  07/01/18 111/65  06/07/18 (!) 155/68    Wt Readings from Last 3 Encounters:  07/06/18 224 lb (101.6 kg)  04/03/18 228 lb (103.4 kg)  03/02/18 227 lb (103 kg)    Physical Exam  Constitutional: She appears well-developed. No distress.  HENT:  Head: Normocephalic.  Right Ear: External ear normal.  Left Ear: External ear normal.  Nose: Nose normal.  Mouth/Throat: Oropharynx is clear and moist.  Eyes: Pupils are equal, round, and reactive to light. Conjunctivae are normal. Right eye exhibits no discharge. Left eye exhibits no discharge.  Neck: Normal range of motion. Neck supple. No JVD present. No tracheal deviation present. No thyromegaly present.  Cardiovascular: Normal rate, regular rhythm and normal heart sounds.  Pulmonary/Chest: No stridor. No respiratory distress. She has no wheezes.  Abdominal: Soft. Bowel sounds are normal. She exhibits no distension and no mass. There is no tenderness. There is no rebound and no guarding.  Musculoskeletal: She exhibits no edema or tenderness.  Lymphadenopathy:    She has no cervical adenopathy.  Neurological: She  displays normal reflexes. No cranial nerve deficit. She exhibits normal muscle tone. Coordination normal.  Skin: No rash noted. No erythema.  Psychiatric: She has a normal mood and affect. Her behavior is normal. Judgment and thought content normal.  obese  Lab Results  Component Value Date   WBC 3.1 (L) 02/16/2017   HGB 13.6 07/13/2017   HCT 40.0 07/13/2017   PLT 98.0 (L) 02/16/2017     GLUCOSE 106 (H) 04/04/2018   CHOL 170 02/16/2017   TRIG 126.0 02/16/2017   HDL 55.80 02/16/2017   LDLDIRECT 139.3 05/31/2012   LDLCALC 89 02/16/2017   ALT 11 02/16/2017   AST 16 02/16/2017   NA 139 04/04/2018   K 3.8 04/04/2018   CL 102 04/04/2018   CREATININE 1.35 (H) 04/04/2018   BUN 24 (H) 04/04/2018   CO2 29 04/04/2018   TSH 1.02 02/16/2017   INR 1.16 08/16/2009    No results found.  Assessment & Plan:   There are no diagnoses linked to this encounter.   No orders of the defined types were placed in this encounter.    Follow-up: No follow-ups on file.  Walker Kehr, MD

## 2018-07-06 NOTE — Assessment & Plan Note (Signed)
On B12 

## 2018-07-06 NOTE — Assessment & Plan Note (Signed)
Chronic Miralax prn

## 2018-07-06 NOTE — Assessment & Plan Note (Signed)
2018 Dr Henrene Pastor Proctocort supp prn

## 2018-07-25 ENCOUNTER — Telehealth: Payer: Self-pay | Admitting: Internal Medicine

## 2018-07-25 MED ORDER — FAMOTIDINE 40 MG PO TABS: 40.0000 mg | ORAL_TABLET | Freq: Every day | ORAL | 3 refills | Status: DC

## 2018-07-25 NOTE — Telephone Encounter (Signed)
Copied from [place] [phone]. Topic: Quick Communication - Rx Refill/Question >> Jul 25, 2018 11:50 AM Scherrie Gerlach wrote: Medication: ranitidine (ZANTAC) 150 MG tablet  Pt states this med is on recall and [facility] gave her 2 alternates, if the dr wants to prescribe one of them Cimetidine  or somotidine  [place], [place] [phone] (Phone) [phone] (Fax)

## 2018-07-25 NOTE — Telephone Encounter (Signed)
Ok - will switch to [employer]

## 2018-07-25 NOTE — Telephone Encounter (Signed)
Please advise 

## 2018-08-29 ENCOUNTER — Telehealth: Payer: Self-pay | Admitting: Emergency Medicine

## 2018-08-29 ENCOUNTER — Encounter: Payer: Self-pay | Admitting: Internal Medicine

## 2018-08-29 ENCOUNTER — Ambulatory Visit: Payer: Medicare Other | Admitting: Internal Medicine

## 2018-08-29 VITALS — BP 134/78 | HR 72 | Ht 66.0 in | Wt 224.5 lb

## 2018-08-29 DIAGNOSIS — K649 Unspecified hemorrhoids: Secondary | ICD-10-CM

## 2018-08-29 DIAGNOSIS — K219 Gastro-esophageal reflux disease without esophagitis: Secondary | ICD-10-CM

## 2018-08-29 DIAGNOSIS — K625 Hemorrhage of anus and rectum: Secondary | ICD-10-CM

## 2018-08-29 DIAGNOSIS — K6289 Other specified diseases of anus and rectum: Secondary | ICD-10-CM

## 2018-08-29 DIAGNOSIS — Z860100 Personal history of colon polyps, unspecified: Secondary | ICD-10-CM

## 2018-08-29 MED ORDER — HYDROCORTISONE ACETATE 25 MG RE SUPP: 25.0000 mg | Freq: Every evening | RECTAL | 0 refills | Status: DC | PRN

## 2018-08-29 NOTE — Progress Notes (Signed)
HISTORY OF PRESENT ILLNESS:  Jasmin [name] is a 85 y.o. female with multiple medical problems who has been seen in this office regarding GERD complicated by peptic stricture and a history of multiple adenomatous colon polyps.  She presents today, accompanied by her husband, regarding previous problems with constipation, rectal pain, and rectal bleeding.  She has undergone multiple colonoscopies in the past.  Most recently July 2018 which revealed diminutive transverse colon polyps and left-sided diverticulosis as well as internal hemorrhoids.  No routine surveillance recommended due to age.  The patient states that in November she became severely constipated with associated abdominal pain.  Subsequently passed a hard bowel movement with rectal bleeding.  This brought her to the emergency room on July 01, 2018 (I have reviewed that encounter including rectal examination and laboratories.  Rectal examination revealed a small hemorrhoid.  No bleeding.  Significant pain in the sphincter without tear identified.  Anal fissure suspected.  Treated with MiraLAX and topical lidocaine.  Seen by Dr. Alain Marion 5 days later.  Good bowel movements without bleeding or pain at that time.  Patient tells me that she has been taking MiraLAX daily.  Currently with 2-3 bowel movements per day.  No further bleeding since her emergency room visit.  She has noticed since last week tissue we area outside the rectum with slight burning or stinging.  This has improved after sitting in a warm tub of water.  No other complaints.  Terms of her reflux, symptoms are occasional.  She is no longer on PPI.  She denies dysphasia.  REVIEW OF SYSTEMS:  All non-GI ROS negative unless otherwise stated in the HPI except for itching  Past Medical History:  Diagnosis Date  . Contact dermatitis and other eczema, due to unspecified cause   . Diverticulosis of colon   . Esophageal stricture   . GERD with stricture    Dr Henrene Pastor  .  Hemorrhoids   . Hiatal hernia   . Hyperlipidemia   . Hypertension   . Onychomycosis   . Personal history of urinary calculi   . Polycystic kidney   . Tubulovillous adenoma polyp of colon   . Unspecified vitamin D deficiency   . Vitamin B12 deficiency 2009   Borderline    Past Surgical History:  Procedure Laterality Date  . CATARACT EXTRACTION Bilateral 2017  . DILATION AND CURETTAGE OF UTERUS  1977  . TOE SURGERY  2012   Right foot   . TONSILLECTOMY     as child    Social History [name]  reports that she has never smoked. She has never used smokeless tobacco. She reports that she does not drink alcohol or use drugs.  family history includes Hypertension in her father.  Allergies  Allergen Reactions  . Clarithromycin     Per pt: unknown  . Doxycycline     nausea  . Omeprazole     diarrhea  . Penicillins     Per pt: unknown  . Sulfonamide Derivatives     Per pt unknown       PHYSICAL EXAMINATION: Vital signs: BP 134/78   Pulse 72   Ht 5\' 6"  (1.676 m)   Wt 224 lb 8 oz (101.8 kg)   BMI 36.24 kg/m   Constitutional: generally well-appearing, no acute distress Psychiatric: alert and oriented x3, cooperative Eyes: extraocular movements intact, anicteric, conjunctiva pink Mouth: oral pharynx moist, no lesions Neck: supple no lymphadenopathy Cardiovascular: heart regular rate and rhythm, no murmur Lungs:  clear to auscultation bilaterally Abdomen: soft, nontender, nondistended, no obvious ascites, no peritoneal signs, normal bowel sounds, no organomegaly Rectal: Multiple noninflamed nonthrombosed external hemorrhoids.  No tenderness.  Hemoccult negative stool Extremities: no clubbing or cyanosis.  Trace lower extremity edema bilaterally Skin: no lesions on visible extremities Neuro: No focal deficits.  Cranial nerves intact  ASSESSMENT:  1.  Functional constipation.  Improved on MiraLAX 2.  Problems with rectal pain and bleeding November 2019 for  which she was seen in the emergency room.  Based on rectal examination I agree that she had an anal fissure which has resolved 3.  External hemorrhoids. 4.  History of adenomatous colon polyps.  Aged out of surveillance 5.  GERD.  Occasional symptoms but no recurrent dysphasia   PLAN:  1.  Continue MiraLAX for constipation 2.  Reassured with regards to hemorrhoids 3.  Prescribed Anusol HC suppositories to be used as needed 4.  Reflux precautions  5.  On-demand PPI or H2 receptor antagonist therapy 6.  Resume general medical care with Dr. Alain Marion.  GI follow-up as needed  25-minute spent face-to-face with the patient.  Greater than 50% the time used for counseling regarding her above listed diagnoses, findings, and recommendations as outlined

## 2018-08-29 NOTE — Telephone Encounter (Signed)
PT dropped of Parking Placard form to be filled out. Placed in Brittany's box to be filled out then signed by provider.

## 2018-08-29 NOTE — Telephone Encounter (Signed)
Renewal for parking placard has been completed & placed in providers box to sign.

## 2018-08-29 NOTE — Patient Instructions (Signed)
We have sent the following medications to your pharmacy for you to pick up at your convenience:  Anusol HC Suppositories.  Continue taking your Miralax

## 2018-09-02 ENCOUNTER — Ambulatory Visit (HOSPITAL_COMMUNITY)
Admission: EM | Admit: 2018-09-02 | Discharge: 2018-09-02 | Disposition: A | Discharge status: Home / Self Care | Payer: Medicare Other | Attending: Family Medicine | Admitting: Family Medicine

## 2018-09-02 ENCOUNTER — Encounter (HOSPITAL_COMMUNITY): Payer: Self-pay | Admitting: *Deleted

## 2018-09-02 DIAGNOSIS — M79604 Pain in right leg: Secondary | ICD-10-CM | POA: Insufficient documentation

## 2018-09-02 NOTE — Discharge Instructions (Addendum)
I would recommend tylenol for the symptoms tonight.  If things worsen, return tomorrow for further testing.
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
## 2018-09-02 NOTE — ED Triage Notes (Addendum)
C/O right lower leg "heaviness/tightness" today without any injury.  Also has had some pruritis in BUE over past couple days.

## 2018-09-03 ENCOUNTER — Emergency Department (HOSPITAL_COMMUNITY)
Admission: EM | Admit: 2018-09-03 | Discharge: 2018-09-03 | Disposition: A | Discharge status: Home / Self Care | Payer: Medicare Other | Attending: Emergency Medicine | Admitting: Emergency Medicine

## 2018-09-03 ENCOUNTER — Encounter (HOSPITAL_COMMUNITY): Payer: Self-pay

## 2018-09-03 ENCOUNTER — Emergency Department (HOSPITAL_COMMUNITY): Payer: Medicare Other

## 2018-09-03 ENCOUNTER — Emergency Department (HOSPITAL_BASED_OUTPATIENT_CLINIC_OR_DEPARTMENT_OTHER): Payer: Medicare Other

## 2018-09-03 DIAGNOSIS — I10 Essential (primary) hypertension: Secondary | ICD-10-CM | POA: Insufficient documentation

## 2018-09-03 DIAGNOSIS — M170 Bilateral primary osteoarthritis of knee: Secondary | ICD-10-CM

## 2018-09-03 DIAGNOSIS — Z79899 Other long term (current) drug therapy: Secondary | ICD-10-CM | POA: Insufficient documentation

## 2018-09-03 DIAGNOSIS — M25562 Pain in left knee: Secondary | ICD-10-CM

## 2018-09-03 DIAGNOSIS — M79609 Pain in unspecified limb: Secondary | ICD-10-CM

## 2018-09-03 DIAGNOSIS — M179 Osteoarthritis of knee, unspecified: Secondary | ICD-10-CM | POA: Diagnosis not present

## 2018-09-03 DIAGNOSIS — M7989 Other specified soft tissue disorders: Secondary | ICD-10-CM

## 2018-09-03 DIAGNOSIS — M79604 Pain in right leg: Secondary | ICD-10-CM | POA: Diagnosis present

## 2018-09-03 DIAGNOSIS — M25561 Pain in right knee: Secondary | ICD-10-CM

## 2018-09-03 MED ORDER — PREDNISONE 10 MG (21) PO TBPK: ORAL_TABLET | ORAL | 0 refills | Status: DC

## 2018-09-03 MED ORDER — PREDNISONE 20 MG PO TABS
60.0000 mg | ORAL_TABLET | Freq: Once | ORAL | Status: AC
  Administered 2018-09-03: 60 mg via ORAL
  Filled 2018-09-03: qty 3

## 2018-09-03 NOTE — ED Notes (Signed)
Pt in x-ray - spouse in room.

## 2018-09-03 NOTE — ED Triage Notes (Signed)
Onset yesterday right lateral leg from knee to LE pain.  Pt reports around 08-16-18 she was down on left knee, when pt tried to get up left knee has been painful since.   Now left LE/foot swollen.   Onset last night itching on hands, arms, behind neck, PTA pt took Tylenol and itching stopped.
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
## 2018-09-03 NOTE — Progress Notes (Signed)
VASCULAR LAB PRELIMINARY  PRELIMINARY  PRELIMINARY  PRELIMINARY  Bilateral lower extremity venous duplex completed.    Preliminary report:  There is no DVT or SVT noted in the bilateral lower extremities.  Gave report to Dr. Lucinda Dell, [facility], RVT 09/03/2018, 6:40 PM

## 2018-09-04 NOTE — Telephone Encounter (Signed)
Signed, Copy sent to scan.   Informed parking placard is ready to be picked up.

## 2018-09-13 ENCOUNTER — Encounter (INDEPENDENT_AMBULATORY_CARE_PROVIDER_SITE_OTHER): Payer: Self-pay | Admitting: Orthopaedic Surgery

## 2018-09-13 ENCOUNTER — Ambulatory Visit (INDEPENDENT_AMBULATORY_CARE_PROVIDER_SITE_OTHER): Payer: Medicare Other | Admitting: Orthopaedic Surgery

## 2018-09-13 DIAGNOSIS — M170 Bilateral primary osteoarthritis of knee: Secondary | ICD-10-CM

## 2018-09-13 MED ORDER — LIDOCAINE HCL 1 % IJ SOLN
3.0000 mL | INTRAMUSCULAR | Status: AC | PRN
  Administered 2018-09-13: 3 mL

## 2018-09-13 MED ORDER — BUPIVACAINE HCL 0.25 % IJ SOLN
0.6600 mL | INTRAMUSCULAR | Status: AC | PRN
  Administered 2018-09-13: .66 mL via INTRA_ARTICULAR

## 2018-09-13 MED ORDER — METHYLPREDNISOLONE ACETATE 40 MG/ML IJ SUSP
13.3300 mg | INTRAMUSCULAR | Status: AC | PRN
  Administered 2018-09-13: 13.33 mg via INTRA_ARTICULAR
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
## 2018-09-19 ENCOUNTER — Other Ambulatory Visit: Payer: Self-pay | Admitting: Internal Medicine

## 2018-09-19 MED ORDER — LOVASTATIN 40 MG PO TABS: 40.0000 mg | ORAL_TABLET | Freq: Every day | ORAL | 0 refills | Status: DC

## 2018-09-19 MED ORDER — TRIAMTERENE-HCTZ 37.5-25 MG PO TABS: 1.0000 | ORAL_TABLET | Freq: Every day | ORAL | 0 refills | Status: DC

## 2018-09-19 MED ORDER — METOPROLOL TARTRATE 50 MG PO TABS: 50.0000 mg | ORAL_TABLET | Freq: Two times a day (BID) | ORAL | 0 refills | Status: DC

## 2018-09-19 NOTE — Telephone Encounter (Signed)
Copied from [facility] [id number]. Topic: Quick Communication - Rx Refill/Question >> Sep 19, 2018 11:21 AM Keene Breath wrote: Medication: lovastatin (MEVACOR) 40 MG tablet / metoprolol tartrate (LOPRESSOR) 50 MG tablet / triamterene-hydrochlorothiazide (MAXZIDE-25) 37.5-25 MG tablet  Patient called to request a refill for the above medication  Preferred Pharmacy (with phone number or street name): [place], [place] [employer] [phone] (Phone) [phone] (Fax)

## 2018-09-19 NOTE — Telephone Encounter (Signed)
Requested Prescriptions  Pending Prescriptions Disp Refills  . lovastatin (MEVACOR) 40 MG tablet 90 tablet 0    Sig: Take 1 tablet (40 mg total) by mouth at bedtime.     Cardiovascular:  Antilipid - Statins Failed - 09/19/2018 11:35 AM      Failed - Total Cholesterol in normal range and within 360 days    Cholesterol  Date Value Ref Range Status  02/16/2017 170 0 - 200 mg/dL Final    Comment:    ATP III Classification       Desirable:  < 200 mg/dL               Borderline High:  200 - 239 mg/dL          High:  > = 240 mg/dL         Failed - LDL in normal range and within 360 days    LDL Cholesterol  Date Value Ref Range Status  02/16/2017 89 0 - 99 mg/dL Final         Failed - HDL in normal range and within 360 days    HDL  Date Value Ref Range Status  02/16/2017 55.80 >39.00 mg/dL Final         Failed - Triglycerides in normal range and within 360 days    Triglycerides  Date Value Ref Range Status  02/16/2017 126.0 0.0 - 149.0 mg/dL Final    Comment:    Normal:  <150 mg/dLBorderline High:  150 - 199 mg/dL         Passed - Patient is not pregnant      Passed - Valid encounter within last 12 months    Recent Outpatient Visits          2 months ago B12 deficiency   [place] Plotnikov, Evie Lacks, MD   5 months ago Gout involving toe, unspecified cause, unspecified chronicity, unspecified laterality   [place] Plotnikov, Evie Lacks, MD   8 months ago Peripheral neuropathic pain   [place] Plotnikov, Evie Lacks, MD   1 year ago Gout involving toe, unspecified cause, unspecified chronicity, unspecified laterality   [facility] Plotnikov, Evie Lacks, MD   1 year ago Gout involving toe of right foot, unspecified cause, unspecified chronicity   [place], Crosby Oyster, DO      Future Appointments            In 2 weeks  Plotnikov, Evie Lacks, MD [place], [place]   In 5 months  [place], [facility]         . triamterene-hydrochlorothiazide (MAXZIDE-25) 37.5-25 MG tablet 90 tablet 3    Sig: Take 1 tablet by mouth daily.     Cardiovascular: Diuretic Combos Failed - 09/19/2018 11:35 AM      Failed - Cr in normal range and within 360 days    Creatinine, Ser  Date Value Ref Range Status  04/04/2018 1.35 (H) 0.40 - 1.20 mg/dL Final         Passed - K in normal range and within 360 days    Potassium  Date Value Ref Range Status  04/04/2018 3.8 3.5 - 5.1 mEq/L Final         Passed - Na in normal range and within 360 days    Sodium  Date Value Ref Range Status  04/04/2018 139 135 -  145 mEq/L Final         Passed - Ca in normal range and within 360 days    Calcium  Date Value Ref Range Status  04/04/2018 10.1 8.4 - 10.5 mg/dL Final         Passed - Last BP in normal range    BP Readings from Last 1 Encounters:  09/03/18 (!) 139/55         Passed - Valid encounter within last 6 months    Recent Outpatient Visits          2 months ago B12 deficiency   [place] Plotnikov, Evie Lacks, MD   5 months ago Gout involving toe, unspecified cause, unspecified chronicity, unspecified laterality   [place] Plotnikov, Evie Lacks, MD   8 months ago Peripheral neuropathic pain   [place] Plotnikov, Evie Lacks, MD   1 year ago Gout involving toe, unspecified cause, unspecified chronicity, unspecified laterality   [facility] Plotnikov, Evie Lacks, MD   1 year ago Gout involving toe of right foot, unspecified cause, unspecified chronicity   [place], Crosby Oyster, DO      Future Appointments            In 2 weeks Plotnikov, Evie Lacks, MD [place], [place]   In 5 months  [place], [facility]         . metoprolol tartrate (LOPRESSOR) 50 MG tablet 180 tablet 3    Sig: Take 1 tablet (50 mg total) by mouth 2 (two) times daily.     Cardiovascular:  Beta Blockers Passed - 09/19/2018 11:35 AM      Passed - Last BP in normal range    BP Readings from Last 1 Encounters:  09/03/18 (!) 139/55         Passed - Last Heart Rate in normal range    Pulse Readings from Last 1 Encounters:  09/03/18 66         Passed - Valid encounter within last 6 months    Recent Outpatient Visits          2 months ago B12 deficiency   [place] Plotnikov, Evie Lacks, MD   5 months ago Gout involving toe, unspecified cause, unspecified chronicity, unspecified laterality   [place] Plotnikov, Evie Lacks, MD   8 months ago Peripheral neuropathic pain   [place], Evie Lacks, MD   1 year ago Gout involving toe, unspecified cause, unspecified chronicity, unspecified laterality   [place] Plotnikov, Evie Lacks, MD   1 year ago Gout involving toe of right foot, unspecified cause, unspecified chronicity   [place], Crosby Oyster, DO      Future Appointments            In 2 weeks Plotnikov, Evie Lacks, MD [place], [place]   In 5 months  [place], [facility]

## 2018-09-21 ENCOUNTER — Ambulatory Visit (HOSPITAL_COMMUNITY)
Admission: EM | Admit: 2018-09-21 | Discharge: 2018-09-21 | Disposition: A | Discharge status: Home / Self Care | Payer: Medicare Other | Attending: Family Medicine | Admitting: Family Medicine

## 2018-09-21 ENCOUNTER — Encounter (HOSPITAL_COMMUNITY): Payer: Self-pay | Admitting: Emergency Medicine

## 2018-09-21 DIAGNOSIS — M79604 Pain in right leg: Secondary | ICD-10-CM

## 2018-09-21 MED ORDER — TRAMADOL-ACETAMINOPHEN 37.5-325 MG PO TABS: 1.0000 | ORAL_TABLET | Freq: Four times a day (QID) | ORAL | 0 refills | Status: DC | PRN

## 2018-09-21 NOTE — Discharge Instructions (Signed)
Use warm compresses Gently  massage of the area Limit walking while painful Take the pain medicine as needed Caution drowsiness and dizziness See your orthopedic next week

## 2018-09-21 NOTE — ED Provider Notes (Signed)
[place]    CSN: [id number] Arrival date & time: 09/21/18  1648     History   Chief Complaint Chief Complaint  Patient presents with  . Leg Pain    HPI Jasmin [name] is a 85 y.o. female.   HPI Patient has had multiple visits to the emergency room, the urgent care center, to her primary care doctor, and to orthopedics for problems with her knees and legs after a fall.  The fall was in November.  She has had x-rays of her knees.  She has significant osteoarthritis.  She was treated with medications.  She received bilateral cortisone injections.  She states finally her knees are starting to feel better.  She is also complained, multiple times, pain in her right calf.  She was seen by the orthopedist.  States she was told to use ice and heat and massage.  She was seen by the emergency room.  DVT evaluation was negative.  She is here today because she continues to have right calf pain.  Pain with ambulation.  Pain at rest.  No changes of the skin.  No swelling.  No local palpable abnormality. Past Medical History:  Diagnosis Date  . Contact dermatitis and other eczema, due to unspecified cause   . Diverticulosis of colon   . Esophageal stricture   . GERD with stricture    Dr Henrene Pastor  . Hemorrhoids   . Hiatal hernia   . Hyperlipidemia   . Hypertension   . Onychomycosis   . Personal history of urinary calculi   . Polycystic kidney   . Tubulovillous adenoma polyp of colon   . Unspecified vitamin D deficiency   . Vitamin B12 deficiency 2009   Borderline    Patient Active Problem List   Diagnosis Date Noted  . Bilateral primary osteoarthritis of knee 09/13/2018  . Constipation 07/06/2018  . Peripheral neuropathic pain 12/28/2017  . Gout 08/30/2017  . History of colonic polyps 08/13/2016  . Lipoma of arm 09/30/2014  . WBC decreased 05/22/2013  . Low back pain 11/16/2012  . Obesity 05/31/2012  . Dyspnea 03/10/2012  . Edema 03/10/2012  . Memory problem  11/24/2011  . Internal hemorrhoid 05/20/2011  . Well adult exam 11/17/2010  . ONYCHOMYCOSIS 06/10/2009  . DYSPHAGIA 09/10/2008  . B12 deficiency 04/15/2008  . ESOPHAGEAL STRICTURE 12/05/2007  . Hiatal hernia 12/05/2007  . FRACTURE, RIGHT LEG 12/05/2007  . NEPHROLITHIASIS, HX OF 12/05/2007  . PARESTHESIA 09/22/2007  . GERD 08/09/2007  . Vitamin D deficiency 03/20/2007  . Dyslipidemia 03/20/2007  . Essential hypertension 03/20/2007  . DIVERTICULOSIS, COLON 03/20/2007    Past Surgical History:  Procedure Laterality Date  . CATARACT EXTRACTION Bilateral 2017  . DILATION AND CURETTAGE OF UTERUS  1977  . TOE SURGERY  2012   Right foot   . TONSILLECTOMY     as child    OB History   No obstetric history on file.      Home Medications    Prior to Admission medications   Medication Sig Start Date End Date Taking? Authorizing Provider  Cholecalciferol 1000 units tablet Take 2 tablets (2,000 Units total) by mouth daily. 10/06/15   Plotnikov, Evie Lacks, MD  FAMOTIDINE PO Take by mouth.    [provider]  hydrocortisone (ANUSOL-HC) 25 MG suppository Place 1 suppository (25 mg total) rectally at bedtime as needed for hemorrhoids or anal itching. 08/29/18   Irene Shipper, MD  lovastatin (MEVACOR) 40 MG tablet Take 1  tablet (40 mg total) by mouth at bedtime. 09/19/18   Plotnikov, Evie Lacks, MD  metoprolol tartrate (LOPRESSOR) 50 MG tablet Take 1 tablet (50 mg total) by mouth 2 (two) times daily. 09/19/18   Plotnikov, Evie Lacks, MD  polyethylene glycol powder (GLYCOLAX/MIRALAX) powder Take 17 g by mouth 2 (two) times daily as needed for mild constipation, moderate constipation or severe constipation. Patient taking differently: Take 17 g by mouth daily as needed for mild constipation, moderate constipation or severe constipation.  07/06/18   Plotnikov, Evie Lacks, MD  traMADol-acetaminophen (ULTRACET) 37.5-325 MG tablet Take 1-2 tablets by mouth every 6 (six) hours as needed. 09/21/18    Raylene Everts, MD  triamterene-hydrochlorothiazide (MAXZIDE-25) 37.5-25 MG tablet Take 1 tablet by mouth daily. 09/19/18   Plotnikov, Evie Lacks, MD    Family History Family History  Problem Relation Age of Onset  . Hypertension Father   . Colon cancer Neg Hx   . Esophageal cancer Neg Hx   . Rectal cancer Neg Hx   . Stomach cancer Neg Hx     Social History Social History   Tobacco Use  . Smoking status: Never Smoker  . Smokeless tobacco: Never Used  Substance Use Topics  . Alcohol use: No    Alcohol/week: 0.0 standard drinks  . Drug use: No     Allergies   Clarithromycin; Doxycycline; Omeprazole; Penicillins; and Sulfonamide derivatives   Review of Systems Review of Systems  Constitutional: Negative for chills and fever.  HENT: Negative for ear pain and sore throat.   Eyes: Negative for pain and visual disturbance.  Respiratory: Negative for cough and shortness of breath.   Cardiovascular: Negative for chest pain and palpitations.  Gastrointestinal: Negative for abdominal pain and vomiting.  Genitourinary: Negative for dysuria and hematuria.  Musculoskeletal: Positive for arthralgias, gait problem and myalgias. Negative for back pain.  Skin: Negative for color change and rash.  Neurological: Negative for seizures and syncope.  All other systems reviewed and are negative.    Physical Exam Triage Vital Signs ED Triage Vitals  Enc Vitals Group     BP 09/21/18 1726 (!) 153/65     Pulse Rate 09/21/18 1726 71     Resp 09/21/18 1726 18     Temp 09/21/18 1726 98 F (36.7 C)     Temp Source 09/21/18 1726 Oral     SpO2 09/21/18 1726 97 %     Weight --      Height --      Head Circumference --      Peak Flow --      Pain Score 09/21/18 1751 10     Pain Loc --      Pain Edu? --      Excl. in Learned? --    No data found.  Updated Vital Signs BP (!) 153/65 (BP Location: Right Arm)   Pulse 71   Temp 98 F (36.7 C) (Oral)   Resp 18   SpO2 97%   Visual  Acuity Right Eye Distance:   Left Eye Distance:   Bilateral Distance:    Right Eye Near:   Left Eye Near:    Bilateral Near:     Physical Exam Constitutional:      General: She is not in acute distress.    Appearance: She is well-developed. She is obese.  HENT:     Head: Normocephalic and atraumatic.  Eyes:     Conjunctiva/sclera: Conjunctivae normal.     Pupils: Pupils  are equal, round, and reactive to light.  Neck:     Musculoskeletal: Normal range of motion.  Cardiovascular:     Rate and Rhythm: Normal rate.  Pulmonary:     Effort: Pulmonary effort is normal. No respiratory distress.  Abdominal:     General: There is no distension.     Palpations: Abdomen is soft.  Musculoskeletal: Normal range of motion.       Legs:  Skin:    General: Skin is warm and dry.  Neurological:     General: No focal deficit present.     Mental Status: She is alert.     Gait: Gait abnormal.  Psychiatric:        Mood and Affect: Mood normal.      UC Treatments / Results  Labs (all labs ordered are listed, but only abnormal results are displayed) Labs Reviewed - No data to display  EKG None  Radiology No results found.  Procedures Procedures (including critical care time)  Medications Ordered in UC Medications - No data to display  Initial Impression / Assessment and Plan / UC Course  I have reviewed the triage vital signs and the nursing notes.  Pertinent labs & imaging results that were available during my care of the patient were reviewed by me and considered in my medical decision making (see chart for details).     I explained to the patient there is little I can do for her in the urgent care center.  She is had ultrasounds.  She is had x-rays.  Nothing is been found in this area.  She has been seen by ER, primary care, and orthopedics.  It appears to be in the soft tissues of the lower leg.  I recommend physical therapy.  She states she cannot take the nonsteroid  anti-inflammatories because of Drug interactions and other problems.  When to give her a limited number of Ultracet to take for pain.  This is a low-dose of tramadol.  She is cautioned this can cause drowsiness, dizziness, nausea.  She does not drive.  She is here with her husband who states he will watch her. Final Clinical Impressions(s) / UC Diagnoses   Final diagnoses:  Right leg pain     Discharge Instructions     Use warm compresses Gently  massage of the area Limit walking while painful Take the pain medicine as needed Caution drowsiness and dizziness See your orthopedic next week   ED Prescriptions    Medication Sig Dispense Auth. Provider   traMADol-acetaminophen (ULTRACET) 37.5-325 MG tablet Take 1-2 tablets by mouth every 6 (six) hours as needed. 30 tablet Raylene Everts, MD     Controlled Substance Prescriptions [place] Controlled Substance Registry consulted? Genella Mech, MD 09/21/18 2119

## 2018-09-21 NOTE — ED Triage Notes (Signed)
Pt c/o R lower leg pain since christmas, states she injured her L knee during christmas and shes been putting her weight on her R leg. Has been to the ER for same.

## 2018-09-24 ENCOUNTER — Emergency Department (HOSPITAL_COMMUNITY)
Admission: EM | Admit: 2018-09-24 | Discharge: 2018-09-24 | Disposition: A | Discharge status: Home / Self Care | Payer: Medicare Other | Attending: Emergency Medicine | Admitting: Emergency Medicine

## 2018-09-24 ENCOUNTER — Encounter (HOSPITAL_COMMUNITY): Payer: Self-pay | Admitting: Emergency Medicine

## 2018-09-24 DIAGNOSIS — M179 Osteoarthritis of knee, unspecified: Secondary | ICD-10-CM | POA: Diagnosis not present

## 2018-09-24 DIAGNOSIS — M10072 Idiopathic gout, left ankle and foot: Secondary | ICD-10-CM | POA: Diagnosis not present

## 2018-09-24 DIAGNOSIS — R52 Pain, unspecified: Secondary | ICD-10-CM | POA: Diagnosis not present

## 2018-09-24 DIAGNOSIS — M109 Gout, unspecified: Secondary | ICD-10-CM | POA: Diagnosis not present

## 2018-09-24 DIAGNOSIS — I10 Essential (primary) hypertension: Secondary | ICD-10-CM | POA: Insufficient documentation

## 2018-09-24 DIAGNOSIS — Z79899 Other long term (current) drug therapy: Secondary | ICD-10-CM | POA: Insufficient documentation

## 2018-09-24 DIAGNOSIS — M79672 Pain in left foot: Secondary | ICD-10-CM | POA: Diagnosis present

## 2018-09-24 MED ORDER — KETOROLAC TROMETHAMINE 30 MG/ML IJ SOLN
15.0000 mg | Freq: Once | INTRAMUSCULAR | Status: AC
  Administered 2018-09-24: 15 mg via INTRAMUSCULAR
  Filled 2018-09-24: qty 1

## 2018-09-24 MED ORDER — PREDNISONE 20 MG PO TABS: ORAL_TABLET | ORAL | 0 refills | Status: DC

## 2018-09-24 MED ORDER — COLCHICINE 0.6 MG PO TABS: 0.6000 mg | ORAL_TABLET | Freq: Two times a day (BID) | ORAL | 0 refills | Status: DC

## 2018-09-24 MED ORDER — COLCHICINE 0.6 MG PO TABS
0.6000 mg | ORAL_TABLET | Freq: Once | ORAL | Status: AC
  Administered 2018-09-24: 0.6 mg via ORAL
  Filled 2018-09-24: qty 1

## 2018-09-24 NOTE — ED Triage Notes (Addendum)
Per EMS, pt. From home with complaint of bil.  lower leg pain x 3 weeks. Pt. Was seen at urgent care and was dx of gout and was given steroid. This evening pt. Has cramping on right calf to right foot. Pain at 10/10.

## 2018-09-24 NOTE — Discharge Instructions (Signed)
You need to talk to Dr. Alain Marion about your hydrochlorothiazide, it can make gout worse.  Keep your appointment tomorrow with the orthopedist, let him know if your pain is not a lot better by tomorrow.  Look at the low purine eating diet which will help prevent attacks of the gout.

## 2018-09-24 NOTE — ED Notes (Signed)
Bed: [facility] Expected date:  Expected time:  Means of arrival:  Comments: EMS 85 yo female bilateral leg pain for 3 weeks-seen in ED 1 week ago-steroids with no relief

## 2018-09-24 NOTE — ED Provider Notes (Signed)
[facility] DEPT Provider Note   CSN: [id number] Arrival date & time: 09/24/18  0235  Time seen 5:27 AM   History   Chief Complaint Chief Complaint  Patient presents with  . Leg Pain    HPI Jasmin [name] is a 85 y.o. female.  HPI patient states she has a history of gout couple years ago and it started out in her MTP joint of her right great toe.  She states shortly after Christmas she was kneeling on her left knee and because it was hurting she was pretty long stress on her right knee and both knees were hurting.  She came to the ED and had x-rays done which showed arthritis.  She followed up with orthopedics and they injected her knees and she states her knees no longer hurt.  However she states she is having pain in her proximal right lower leg that she thinks she has a "blood clog".  It is been there for several weeks.  She states tonight she started having pain in her feet.  She has pain in her MTP joint of her left great toe and in her medial midfoot.  She denies any injury or fever.  She is currently not on any medications for her gout.  PCP Plotnikov, Evie Lacks, MD has an appointment on the 12th Orthopedics Dr Erlinda Hong has an appointment tomorrow, February 3  Past Medical History:  Diagnosis Date  . Contact dermatitis and other eczema, due to unspecified cause   . Diverticulosis of colon   . Esophageal stricture   . GERD with stricture    Dr Henrene Pastor  . Hemorrhoids   . Hiatal hernia   . Hyperlipidemia   . Hypertension   . Onychomycosis   . Personal history of urinary calculi   . Polycystic kidney   . Tubulovillous adenoma polyp of colon   . Unspecified vitamin D deficiency   . Vitamin B12 deficiency 2009   Borderline    Patient Active Problem List   Diagnosis Date Noted  . Bilateral primary osteoarthritis of knee 09/13/2018  . Constipation 07/06/2018  . Peripheral neuropathic pain 12/28/2017  . Gout 08/30/2017  . History of colonic  polyps 08/13/2016  . Lipoma of arm 09/30/2014  . WBC decreased 05/22/2013  . Low back pain 11/16/2012  . Obesity 05/31/2012  . Dyspnea 03/10/2012  . Edema 03/10/2012  . Memory problem 11/24/2011  . Internal hemorrhoid 05/20/2011  . Well adult exam 11/17/2010  . ONYCHOMYCOSIS 06/10/2009  . DYSPHAGIA 09/10/2008  . B12 deficiency 04/15/2008  . ESOPHAGEAL STRICTURE 12/05/2007  . Hiatal hernia 12/05/2007  . FRACTURE, RIGHT LEG 12/05/2007  . NEPHROLITHIASIS, HX OF 12/05/2007  . PARESTHESIA 09/22/2007  . GERD 08/09/2007  . Vitamin D deficiency 03/20/2007  . Dyslipidemia 03/20/2007  . Essential hypertension 03/20/2007  . DIVERTICULOSIS, COLON 03/20/2007    Past Surgical History:  Procedure Laterality Date  . CATARACT EXTRACTION Bilateral 2017  . DILATION AND CURETTAGE OF UTERUS  1977  . TOE SURGERY  2012   Right foot   . TONSILLECTOMY     as child     OB History   No obstetric history on file.      Home Medications    Prior to Admission medications   Medication Sig Start Date End Date Taking? Authorizing Provider  Cholecalciferol 1000 units tablet Take 2 tablets (2,000 Units total) by mouth daily. 10/06/15  Yes Plotnikov, Evie Lacks, MD  hydrocortisone (ANUSOL-HC) 25 MG suppository Place 1  suppository (25 mg total) rectally at bedtime as needed for hemorrhoids or anal itching. 08/29/18  Yes Irene Shipper, MD  lovastatin (MEVACOR) 40 MG tablet Take 1 tablet (40 mg total) by mouth at bedtime. 09/19/18  Yes Plotnikov, Evie Lacks, MD  metoprolol tartrate (LOPRESSOR) 50 MG tablet Take 1 tablet (50 mg total) by mouth 2 (two) times daily. 09/19/18  Yes Plotnikov, Evie Lacks, MD  polyethylene glycol powder (GLYCOLAX/MIRALAX) powder Take 17 g by mouth 2 (two) times daily as needed for mild constipation, moderate constipation or severe constipation. Patient taking differently: Take 17 g by mouth daily as needed for mild constipation, moderate constipation or severe constipation.  07/06/18   Yes Plotnikov, Evie Lacks, MD  traMADol-acetaminophen (ULTRACET) 37.5-325 MG tablet Take 1-2 tablets by mouth every 6 (six) hours as needed. Patient taking differently: Take 1-2 tablets by mouth every 6 (six) hours as needed for moderate pain or severe pain.  09/21/18  Yes Raylene Everts, MD  triamterene-hydrochlorothiazide (MAXZIDE-25) 37.5-25 MG tablet Take 1 tablet by mouth daily. 09/19/18  Yes Plotnikov, Evie Lacks, MD  colchicine 0.6 MG tablet Take 1 tablet (0.6 mg total) by mouth 2 (two) times daily. 09/24/18   Rolland Porter, MD  predniSONE (DELTASONE) 20 MG tablet Take 3 po QD x 3d , then 2 po QD x 3d then 1 po QD x 3d 09/24/18   Rolland Porter, MD    Family History Family History  Problem Relation Age of Onset  . Hypertension Father   . Colon cancer Neg Hx   . Esophageal cancer Neg Hx   . Rectal cancer Neg Hx   . Stomach cancer Neg Hx     Social History Social History   Tobacco Use  . Smoking status: Never Smoker  . Smokeless tobacco: Never Used  Substance Use Topics  . Alcohol use: No    Alcohol/week: 0.0 standard drinks  . Drug use: No     Allergies   Doxycycline; Omeprazole; Sulfonamide derivatives; Clarithromycin; and Penicillins   Review of Systems Review of Systems  All other systems reviewed and are negative.    Physical Exam Updated Vital Signs BP 125/72 (BP Location: Right Arm)   Pulse 69   Temp 98.8 F (37.1 C) (Oral)   Resp 17   SpO2 96%   Vital signs normal    Physical Exam Vitals signs and nursing note reviewed.  Constitutional:      Appearance: Normal appearance.  HENT:     Head: Normocephalic and atraumatic.     Right Ear: External ear normal.     Left Ear: External ear normal.     Nose: Nose normal.  Eyes:     Extraocular Movements: Extraocular movements intact.     Conjunctiva/sclera: Conjunctivae normal.  Neck:     Musculoskeletal: Normal range of motion.  Cardiovascular:     Rate and Rhythm: Normal rate.  Pulmonary:     Effort:  Pulmonary effort is normal. No respiratory distress.  Musculoskeletal:        General: Tenderness present. No swelling.  Skin:    General: Skin is warm and dry.     Findings: No erythema.  Neurological:     General: No focal deficit present.     Mental Status: She is alert and oriented to person, place, and time.     Cranial Nerves: No cranial nerve deficit.  Psychiatric:        Mood and Affect: Mood normal.  Behavior: Behavior normal.        Thought Content: Thought content normal.      ED Treatments / Results  Labs (all labs ordered are listed, but only abnormal results are displayed)  Uric acid level done July 13, 2017 was elevated at 7.9   Dg Knee Complete 4 Views Left  Result Date: 09/03/2018 CLINICAL DATA:  85 year old with BILATERAL knee pain temporally related to when the patient retrieved her Christmas decorations while on her knees approximately 1 month ago. Marland Kitchen IMPRESSION: 1. No acute or subacute osseous abnormality. 2. Mild tricompartment osteoarthritis with associated spurring in the medial compartment. Electronically Signed   By: Evangeline Dakin M.D.   On: 09/03/2018 18:58   Dg Knee Complete 4 Views Right  Result Date: 09/03/2018 CLINICAL DATA:  Knee pain and swelling.IMPRESSION: Tricompartmental degenerative changes without acute bony abnormality. Electronically Signed   By: Misty Stanley M.D.   On: 09/03/2018 18:56   Vas [place] Lower Extremity Venous (dvt) (only [facility])  Result Date: 09/04/2018  Lower Venous Study Indications: Pain, and Swelling.  Right: There is no evidence of deep vein thrombosis in the lower extremity. Left: There is no evidence of deep vein thrombosis in the lower extremity.  *See table(s) above for measurements and observations. Electronically signed by Deitra Mayo MD on 09/04/2018 at 10:43:23 AM.    Final      EKG None  Radiology No results found.  Procedures Procedures (including critical care time)  Medications  Ordered in ED Medications  colchicine tablet 0.6 mg (has no administration in time range)  ketorolac (TORADOL) 30 MG/ML injection 15 mg (15 mg Intramuscular Given 09/24/18 0543)     Initial Impression / Assessment and Plan / ED Course  I have reviewed the triage vital signs and the nursing notes.  Pertinent labs & imaging results that were available during my care of the patient were reviewed by me and considered in my medical decision making (see chart for details).     Patient has had recent x-rays of her knees which showed arthritis, recent Doppler ultrasound of her lower legs that was negative for DVT.  She also had elevated uric acid level about a year and a half ago.  At this point no further laboratory testing was felt to be needed.  Patient was given Toradol at reduced dose due to her age and started on colchicine.  We discussed she needed to talk to her primary care doctor about getting her off her hydrochlorothiazide diuretic.  She also has an appointment tomorrow with her orthopedist so she is not doing a lot better they can see if they can inject her toe.  Final Clinical Impressions(s) / ED Diagnoses   Final diagnoses:  Acute gout of foot, unspecified cause, unspecified laterality    ED Discharge Orders         Ordered    colchicine 0.6 MG tablet  2 times daily     09/24/18 0547    predniSONE (DELTASONE) 20 MG tablet     09/24/18 0547          Plan discharge  Rolland Porter, MD, Barbette Or, MD 09/24/18 [phone]

## 2018-09-24 NOTE — ED Notes (Signed)
Pt. Assisted to the bathroom, pt. Ambulated without assistance , with steady gait but complaint of pain on each steps made. Went back to bed safely.

## 2018-09-25 ENCOUNTER — Ambulatory Visit (INDEPENDENT_AMBULATORY_CARE_PROVIDER_SITE_OTHER): Payer: Medicare Other | Admitting: Family Medicine

## 2018-09-25 ENCOUNTER — Telehealth (INDEPENDENT_AMBULATORY_CARE_PROVIDER_SITE_OTHER): Payer: Self-pay | Admitting: Radiology

## 2018-09-25 ENCOUNTER — Encounter (INDEPENDENT_AMBULATORY_CARE_PROVIDER_SITE_OTHER): Payer: Self-pay | Admitting: Family Medicine

## 2018-09-25 DIAGNOSIS — M79661 Pain in right lower leg: Secondary | ICD-10-CM | POA: Diagnosis not present

## 2018-09-25 MED ORDER — PREDNISONE 10 MG PO TABS: ORAL_TABLET | ORAL | 0 refills | Status: DC

## 2018-09-25 NOTE — Telephone Encounter (Signed)
Patient is confused on if a new prescription was sent during visit today that needed to be filled. I explained to family member that I did not see where one was filled that it was to continue taking what was given at hospital.  Can you please call and clarify with patient? she still believes there is another medication.

## 2018-09-25 NOTE — Progress Notes (Signed)
Office Visit Note   Patient: Jasmin [name]           Date of Birth: Oct 06, 1933           MRN: [id number] Visit Date: 09/25/2018 Requested by: Cassandria Anger, MD [place], [place] [id number] PCP: Cassandria Anger, MD  Subjective: Chief Complaint  Patient presents with  . Right Knee - Pain    S/p cortisone injection bilateral knees last week. Knees feel better, but having some pain in posterior knee area.    HPI: She is an 85 year old with right calf pain.  She has been dealing with bilateral knee pain for a couple months.  Cortisone injections recently done have helped significantly with that but she keeps having pain in the back of her right calf.  She had Dopplers which were negative for DVT.Marland Kitchen  She was recently diagnosed with gout in her right great toe MTP joint and took colchicine but it has not helped.              ROS: Otherwise noncontributory  Objective: Vital Signs: There were no vitals taken for this visit.  Physical Exam:  Right leg: She has a myofascial trigger point in the lateral head of her gastrocnemius which seems to reproduce her pain.  No tenderness in the popliteal region of her knee.  No warmth or erythema in the great toe MTP joint but it is slightly tender to palpation here.  Imaging: I briefly imaged her calf with ultrasound and did not see any muscular abnormality.   Assessment & Plan: 1.  Right lower extremity pain, suspect myofascial pain -Physical therapy referral, prednisone.  Follow-up as needed.   Follow-Up Instructions: No follow-ups on file.      Procedures: No procedures performed  No notes on file    PMFS History: Patient Active Problem List   Diagnosis Date Noted  . Bilateral primary osteoarthritis of knee 09/13/2018  . Constipation 07/06/2018  . Peripheral neuropathic pain 12/28/2017  . Gout 08/30/2017  . History of colonic polyps 08/13/2016  . Lipoma of arm 09/30/2014  . WBC decreased 05/22/2013  .  Low back pain 11/16/2012  . Obesity 05/31/2012  . Dyspnea 03/10/2012  . Edema 03/10/2012  . Memory problem 11/24/2011  . Internal hemorrhoid 05/20/2011  . Well adult exam 11/17/2010  . ONYCHOMYCOSIS 06/10/2009  . DYSPHAGIA 09/10/2008  . B12 deficiency 04/15/2008  . ESOPHAGEAL STRICTURE 12/05/2007  . Hiatal hernia 12/05/2007  . FRACTURE, RIGHT LEG 12/05/2007  . NEPHROLITHIASIS, HX OF 12/05/2007  . PARESTHESIA 09/22/2007  . GERD 08/09/2007  . Vitamin D deficiency 03/20/2007  . Dyslipidemia 03/20/2007  . Essential hypertension 03/20/2007  . DIVERTICULOSIS, COLON 03/20/2007   Past Medical History:  Diagnosis Date  . Contact dermatitis and other eczema, due to unspecified cause   . Diverticulosis of colon   . Esophageal stricture   . GERD with stricture    Dr Henrene Pastor  . Hemorrhoids   . Hiatal hernia   . Hyperlipidemia   . Hypertension   . Onychomycosis   . Personal history of urinary calculi   . Polycystic kidney   . Tubulovillous adenoma polyp of colon   . Unspecified vitamin D deficiency   . Vitamin B12 deficiency 2009   Borderline    Family History  Problem Relation Age of Onset  . Hypertension Father   . Colon cancer Neg Hx   . Esophageal cancer Neg Hx   . Rectal cancer Neg Hx   .  Stomach cancer Neg Hx     Past Surgical History:  Procedure Laterality Date  . CATARACT EXTRACTION Bilateral 2017  . DILATION AND CURETTAGE OF UTERUS  1977  . TOE SURGERY  2012   Right foot   . TONSILLECTOMY     as child   Social History   Occupational History  . Occupation: RETIRED  Tobacco Use  . Smoking status: Never Smoker  . Smokeless tobacco: Never Used  Substance and Sexual Activity  . Alcohol use: No    Alcohol/week: 0.0 standard drinks  . Drug use: No  . Sexual activity: Not on file

## 2018-09-25 NOTE — Telephone Encounter (Signed)
Does the patient just take the Prednisone Rx that was given today, instead of the one she got yesterday at the ED?

## 2018-09-25 NOTE — Telephone Encounter (Signed)
I called and advised the patient. The Rx from today was sent in electronically - that is the one to pick up from the pharmacy (the patient still has the written Rx from yesterday - instructed to not take this one to the pharmacy). She is to call back if any further questions.

## 2018-09-25 NOTE — Telephone Encounter (Signed)
Yes, the one from today.

## 2018-09-26 ENCOUNTER — Telehealth: Payer: Self-pay | Admitting: Internal Medicine

## 2018-09-26 NOTE — Telephone Encounter (Signed)
Copied from [place] [phone]. Topic: Quick Communication - See Telephone Encounter >> Sep 26, 2018 11:04 AM Bea Graff, NT wrote: CRM for notification. See Telephone encounter for: 09/26/18. Pt states she was seen in the ER for gout and they told her to call her dr about the triamterene-hydrochlorothiazide as it may be causing her to have gout and the dr may need to take her off the medication. She would like a call to discuss.

## 2018-09-28 NOTE — Telephone Encounter (Signed)
Pt notified she would have to be seen for this, pt already has an appt on 02/12

## 2018-10-02 DIAGNOSIS — R262 Difficulty in walking, not elsewhere classified: Secondary | ICD-10-CM | POA: Diagnosis not present

## 2018-10-02 DIAGNOSIS — M79662 Pain in left lower leg: Secondary | ICD-10-CM | POA: Diagnosis not present

## 2018-10-02 DIAGNOSIS — M6281 Muscle weakness (generalized): Secondary | ICD-10-CM | POA: Diagnosis not present

## 2018-10-04 ENCOUNTER — Encounter: Payer: Self-pay | Admitting: Internal Medicine

## 2018-10-04 ENCOUNTER — Ambulatory Visit (INDEPENDENT_AMBULATORY_CARE_PROVIDER_SITE_OTHER): Payer: Medicare Other | Admitting: Internal Medicine

## 2018-10-04 VITALS — BP 132/78 | HR 57 | Temp 97.8°F | Ht 66.0 in | Wt 216.0 lb

## 2018-10-04 DIAGNOSIS — E785 Hyperlipidemia, unspecified: Secondary | ICD-10-CM

## 2018-10-04 DIAGNOSIS — I10 Essential (primary) hypertension: Secondary | ICD-10-CM | POA: Diagnosis not present

## 2018-10-04 DIAGNOSIS — R262 Difficulty in walking, not elsewhere classified: Secondary | ICD-10-CM | POA: Diagnosis not present

## 2018-10-04 DIAGNOSIS — M79662 Pain in left lower leg: Secondary | ICD-10-CM | POA: Diagnosis not present

## 2018-10-04 DIAGNOSIS — M1000 Idiopathic gout, unspecified site: Secondary | ICD-10-CM

## 2018-10-04 DIAGNOSIS — D708 Other neutropenia: Secondary | ICD-10-CM

## 2018-10-04 DIAGNOSIS — M170 Bilateral primary osteoarthritis of knee: Secondary | ICD-10-CM

## 2018-10-04 DIAGNOSIS — E538 Deficiency of other specified B group vitamins: Secondary | ICD-10-CM | POA: Diagnosis not present

## 2018-10-04 DIAGNOSIS — Z0000 Encounter for general adult medical examination without abnormal findings: Secondary | ICD-10-CM

## 2018-10-04 DIAGNOSIS — Z0001 Encounter for general adult medical examination with abnormal findings: Secondary | ICD-10-CM

## 2018-10-04 DIAGNOSIS — M6281 Muscle weakness (generalized): Secondary | ICD-10-CM | POA: Diagnosis not present

## 2018-10-04 MED ORDER — ALLOPURINOL 100 MG PO TABS: 100.0000 mg | ORAL_TABLET | Freq: Every day | ORAL | 11 refills | Status: DC

## 2018-10-04 MED ORDER — SPIRONOLACTONE 50 MG PO TABS: 50.0000 mg | ORAL_TABLET | Freq: Every day | ORAL | 11 refills | Status: DC

## 2018-10-04 MED ORDER — METHYLPREDNISOLONE 4 MG PO TBPK: ORAL_TABLET | ORAL | 0 refills | Status: DC

## 2018-10-04 NOTE — Assessment & Plan Note (Signed)
Just finished steroids

## 2018-10-04 NOTE — Assessment & Plan Note (Signed)
Feet B  Colchicine prn - too $$$ - d/c Allopurinol Medrol dose pack prn  d/c Maxzide

## 2018-10-04 NOTE — Assessment & Plan Note (Signed)
Lovastatin 

## 2018-10-04 NOTE — Assessment & Plan Note (Signed)
Labs

## 2018-10-04 NOTE — Assessment & Plan Note (Signed)
B complex

## 2018-10-04 NOTE — Assessment & Plan Note (Signed)

## 2018-10-04 NOTE — Progress Notes (Signed)
Subjective:  Patient ID: Jasmin [name], female    DOB: Jun 25, 1934  Age: 85 y.o. MRN: [id number]  CC: No chief complaint on file.   HPI Jasmin [name] presents for a well exam C/o gout in feet - ER visit; recurrent. Saw Dr Jasmin [name] - colchicine is too $$$  Outpatient Medications Prior to Visit  Medication Sig Dispense Refill  . Cholecalciferol 1000 units tablet Take 2 tablets (2,000 Units total) by mouth daily. 100 tablet 5  . colchicine 0.6 MG tablet Take 1 tablet (0.6 mg total) by mouth 2 (two) times daily. 20 tablet 0  . hydrocortisone (ANUSOL-HC) 25 MG suppository Place 1 suppository (25 mg total) rectally at bedtime as needed for hemorrhoids or anal itching. 30 suppository 0  . lovastatin (MEVACOR) 40 MG tablet Take 1 tablet (40 mg total) by mouth at bedtime. 90 tablet 0  . metoprolol tartrate (LOPRESSOR) 50 MG tablet Take 1 tablet (50 mg total) by mouth 2 (two) times daily. 180 tablet 0  . polyethylene glycol powder (GLYCOLAX/MIRALAX) powder Take 17 g by mouth 2 (two) times daily as needed for mild constipation, moderate constipation or severe constipation. (Patient taking differently: Take 17 g by mouth daily as needed for mild constipation, moderate constipation or severe constipation. ) 500 g 3  . traMADol-acetaminophen (ULTRACET) 37.5-325 MG tablet Take 1-2 tablets by mouth every 6 (six) hours as needed. (Patient taking differently: Take 1-2 tablets by mouth every 6 (six) hours as needed for moderate pain or severe pain. ) 30 tablet 0  . triamterene-hydrochlorothiazide (MAXZIDE-25) 37.5-25 MG tablet Take 1 tablet by mouth daily. 90 tablet 0  . predniSONE (DELTASONE) 10 MG tablet Take as directed for 12 days.  Daily dose 6,6,5,5,4,4,3,3,2,2,1,1. 42 tablet 0  . predniSONE (DELTASONE) 20 MG tablet Take 3 po QD x 3d , then 2 po QD x 3d then 1 po QD x 3d 18 tablet 0   No facility-administered medications prior to visit.     ROS: Review of Systems  Constitutional: Negative  for activity change, appetite change, chills, fatigue and unexpected weight change.  HENT: Negative for congestion, mouth sores and sinus pressure.   Eyes: Negative for visual disturbance.  Respiratory: Negative for cough and chest tightness.   Gastrointestinal: Negative for abdominal pain and nausea.  Genitourinary: Negative for difficulty urinating, frequency and vaginal pain.  Musculoskeletal: Positive for arthralgias and gait problem. Negative for back pain.  Skin: Negative for pallor and rash.  Neurological: Negative for dizziness, tremors, weakness, numbness and headaches.  Psychiatric/Behavioral: Negative for confusion, sleep disturbance and suicidal ideas.    Objective:  BP 132/78 (BP Location: Left Arm, Patient Position: Sitting, Cuff Size: Large)   Pulse (!) 57   Temp 97.8 F (36.6 C) (Oral)   Ht 5\' 6"  (1.676 m)   Wt 216 lb (98 kg)   SpO2 97%   BMI 34.86 kg/m   BP Readings from Last 3 Encounters:  10/04/18 132/78  09/24/18 123/67  09/21/18 (!) 153/65    Wt Readings from Last 3 Encounters:  10/04/18 216 lb (98 kg)  08/29/18 224 lb 8 oz (101.8 kg)  07/06/18 224 lb (101.6 kg)    Physical Exam Constitutional:      General: She is not in acute distress.    Appearance: She is well-developed.  HENT:     Head: Normocephalic.     Right Ear: External ear normal.     Left Ear: External ear normal.     Nose: Nose  normal.  Eyes:     General:        Right eye: No discharge.        Left eye: No discharge.     Conjunctiva/sclera: Conjunctivae normal.     Pupils: Pupils are equal, round, and reactive to light.  Neck:     Musculoskeletal: Normal range of motion and neck supple.     Thyroid: No thyromegaly.     Vascular: No JVD.     Trachea: No tracheal deviation.  Cardiovascular:     Rate and Rhythm: Normal rate and regular rhythm.     Heart sounds: Normal heart sounds.  Pulmonary:     Effort: No respiratory distress.     Breath sounds: No stridor. No wheezing.    Abdominal:     General: Bowel sounds are normal. There is no distension.     Palpations: Abdomen is soft. There is no mass.     Tenderness: There is no abdominal tenderness. There is no guarding or rebound.  Musculoskeletal:        General: Tenderness present.  Lymphadenopathy:     Cervical: No cervical adenopathy.  Skin:    Findings: No erythema or rash.  Neurological:     Cranial Nerves: No cranial nerve deficit.     Motor: No abnormal muscle tone.     Coordination: Coordination normal.     Deep Tendon Reflexes: Reflexes normal.  Psychiatric:        Behavior: Behavior normal.        Thought Content: Thought content normal.        Judgment: Judgment normal.   obese  Lab Results  Component Value Date   WBC 3.1 (L) 02/16/2017   HGB 13.6 07/13/2017   HCT 40.0 07/13/2017   PLT 98.0 (L) 02/16/2017   GLUCOSE 106 (H) 04/04/2018   CHOL 170 02/16/2017   TRIG 126.0 02/16/2017   HDL 55.80 02/16/2017   LDLDIRECT 139.3 05/31/2012   LDLCALC 89 02/16/2017   ALT 11 02/16/2017   AST 16 02/16/2017   NA 139 04/04/2018   K 3.8 04/04/2018   CL 102 04/04/2018   CREATININE 1.35 (H) 04/04/2018   BUN 24 (H) 04/04/2018   CO2 29 04/04/2018   TSH 1.02 02/16/2017   INR 1.16 08/16/2009    No results found.  Assessment & Plan:   There are no diagnoses linked to this encounter.   No orders of the defined types were placed in this encounter.    Follow-up: No follow-ups on file.  Jasmin Kehr, MD

## 2018-10-04 NOTE — Assessment & Plan Note (Signed)
d/c Maxzide Start Spironolactone

## 2018-10-09 DIAGNOSIS — M79662 Pain in left lower leg: Secondary | ICD-10-CM | POA: Diagnosis not present

## 2018-10-09 DIAGNOSIS — R262 Difficulty in walking, not elsewhere classified: Secondary | ICD-10-CM | POA: Diagnosis not present

## 2018-10-09 DIAGNOSIS — M6281 Muscle weakness (generalized): Secondary | ICD-10-CM | POA: Diagnosis not present

## 2018-10-10 ENCOUNTER — Other Ambulatory Visit: Payer: Self-pay

## 2018-10-10 MED ORDER — SPIRONOLACTONE 50 MG PO TABS: 50.0000 mg | ORAL_TABLET | Freq: Every day | ORAL | 3 refills | Status: DC

## 2018-10-10 MED ORDER — ALLOPURINOL 100 MG PO TABS: 100.0000 mg | ORAL_TABLET | Freq: Every day | ORAL | 3 refills | Status: DC

## 2018-10-19 DIAGNOSIS — R262 Difficulty in walking, not elsewhere classified: Secondary | ICD-10-CM | POA: Diagnosis not present

## 2018-10-19 DIAGNOSIS — M79662 Pain in left lower leg: Secondary | ICD-10-CM | POA: Diagnosis not present

## 2018-10-19 DIAGNOSIS — M6281 Muscle weakness (generalized): Secondary | ICD-10-CM | POA: Diagnosis not present

## 2018-10-26 ENCOUNTER — Ambulatory Visit (INDEPENDENT_AMBULATORY_CARE_PROVIDER_SITE_OTHER): Payer: Medicare Other | Admitting: Family Medicine

## 2018-10-26 ENCOUNTER — Ambulatory Visit (INDEPENDENT_AMBULATORY_CARE_PROVIDER_SITE_OTHER): Payer: Medicare Other

## 2018-10-26 ENCOUNTER — Encounter (INDEPENDENT_AMBULATORY_CARE_PROVIDER_SITE_OTHER): Payer: Self-pay | Admitting: Family Medicine

## 2018-10-26 DIAGNOSIS — M79661 Pain in right lower leg: Secondary | ICD-10-CM | POA: Diagnosis not present

## 2018-10-26 MED ORDER — TIZANIDINE HCL 2 MG PO TABS: 2.0000 mg | ORAL_TABLET | Freq: Every evening | ORAL | 1 refills | Status: DC | PRN

## 2018-10-26 NOTE — Patient Instructions (Addendum)
   Vitamin D3:  Take 5,000 IU daily for the next several months.  Magnesium:  Take 400 mg daily

## 2018-10-26 NOTE — Progress Notes (Signed)
Office Visit Note   Patient: Jasmin [name]           Date of Birth: November 19, 1933           MRN: [id number] Visit Date: 10/26/2018 Requested by: Cassandria Anger, MD [place], [place] [address] PCP: Cassandria Anger, MD  Subjective: Chief Complaint  Patient presents with  . Right Lower Leg - Pain, Follow-up    Pain has not improved after Prednisone and with PT (has been 4 times). Pain has moved from lateral calf up to lateral knee.    HPI: She is here with persistent right lower leg pain.  Prednisone did not make much difference, physical therapy gave only very brief temporary relief.  She is now having pain up to the lateral knee.  Her pain is primarily with walking, but she also has trouble falling asleep because of her pain.  Denies any low back pain, denies any numbness or weakness in her leg.  She had bilateral knee injections in January which helped quite a bit, but only with the arthritis pain not with the lateral calf pain.  Her knee injections are starting to wear off a little bit.              ROS: Otherwise noncontributory  Objective: Vital Signs: There were no vitals taken for this visit.  Physical Exam:  Right leg: Negative straight leg raise, lower extremity strength and reflexes are normal.  No knee effusion, no warmth or erythema around the knee.  Slightly tender over the lateral joint line.  Slightly tender at the distal IT band.  She remains tender along the lateral calf in the peroneal muscle belly.  Imaging:  X-rays lumbar spine:  Overall well-preserved disc spaces, no significant foraminal spurring.  No acute abnormality seen.    Assessment & Plan: 1.  Worsening right lower leg pain, etiology uncertain.  Exam suggests myofascial pain, but cannot rule out lumbar foraminal stenosis.  Could also be related to knee DJD. - Increase vitamin D to 5,000 IU daily.  Add magnesium 400 mg daily. - Continue home exercises.Try muscle relaxant at bed  time. - Consider lumbar MRI if pain persists.     Procedures: No procedures performed  No notes on file     PMFS History: Patient Active Problem List   Diagnosis Date Noted  . Bilateral primary osteoarthritis of knee 09/13/2018  . Constipation 07/06/2018  . Peripheral neuropathic pain 12/28/2017  . Gout 08/30/2017  . History of colonic polyps 08/13/2016  . Lipoma of arm 09/30/2014  . WBC decreased 05/22/2013  . Low back pain 11/16/2012  . Obesity 05/31/2012  . Dyspnea 03/10/2012  . Edema 03/10/2012  . Memory problem 11/24/2011  . Internal hemorrhoid 05/20/2011  . Well adult exam 11/17/2010  . ONYCHOMYCOSIS 06/10/2009  . DYSPHAGIA 09/10/2008  . B12 deficiency 04/15/2008  . ESOPHAGEAL STRICTURE 12/05/2007  . Hiatal hernia 12/05/2007  . FRACTURE, RIGHT LEG 12/05/2007  . NEPHROLITHIASIS, HX OF 12/05/2007  . PARESTHESIA 09/22/2007  . GERD 08/09/2007  . Vitamin D deficiency 03/20/2007  . Dyslipidemia 03/20/2007  . Essential hypertension 03/20/2007  . DIVERTICULOSIS, COLON 03/20/2007   Past Medical History:  Diagnosis Date  . Contact dermatitis and other eczema, due to unspecified cause   . Diverticulosis of colon   . Esophageal stricture   . GERD with stricture    Dr Henrene Pastor  . Hemorrhoids   . Hiatal hernia   . Hyperlipidemia   . Hypertension   .  Onychomycosis   . Personal history of urinary calculi   . Polycystic kidney   . Tubulovillous adenoma polyp of colon   . Unspecified vitamin D deficiency   . Vitamin B12 deficiency 2009   Borderline    Family History  Problem Relation Age of Onset  . Hypertension Father   . Colon cancer Neg Hx   . Esophageal cancer Neg Hx   . Rectal cancer Neg Hx   . Stomach cancer Neg Hx     Past Surgical History:  Procedure Laterality Date  . CATARACT EXTRACTION Bilateral 2017  . DILATION AND CURETTAGE OF UTERUS  1977  . TOE SURGERY  2012   Right foot   . TONSILLECTOMY     as child   Social History   Occupational  History  . Occupation: RETIRED  Tobacco Use  . Smoking status: Never Smoker  . Smokeless tobacco: Never Used  Substance and Sexual Activity  . Alcohol use: No    Alcohol/week: 0.0 standard drinks  . Drug use: No  . Sexual activity: Not on file

## 2018-12-07 ENCOUNTER — Other Ambulatory Visit: Payer: Self-pay

## 2018-12-07 MED ORDER — LOVASTATIN 40 MG PO TABS: 40.0000 mg | ORAL_TABLET | Freq: Every day | ORAL | 1 refills | Status: DC

## 2018-12-07 MED ORDER — METOPROLOL TARTRATE 50 MG PO TABS: 50.0000 mg | ORAL_TABLET | Freq: Two times a day (BID) | ORAL | 1 refills | Status: DC

## 2018-12-23 ENCOUNTER — Telehealth: Payer: Self-pay | Admitting: Gastroenterology

## 2018-12-23 MED ORDER — HYDROCORTISONE ACETATE 25 MG RE SUPP: 25.0000 mg | Freq: Every evening | RECTAL | 0 refills | Status: DC | PRN

## 2018-12-23 NOTE — Telephone Encounter (Signed)
C/o burning and itching from hemorrhoids, requested refill for hydrocortisone suppositories.  Advised patient to use Anusol suppository at bedtime as needed X 3-5 days Benefiber 1 teaspoon TID with meals Recticare small pea size amount per rectum as needed 3-4 times daily

## 2018-12-28 ENCOUNTER — Ambulatory Visit: Payer: Medicare Other | Admitting: Internal Medicine

## 2019-03-01 ENCOUNTER — Ambulatory Visit: Payer: Medicare Other | Admitting: Internal Medicine

## 2019-03-07 NOTE — Progress Notes (Addendum)
Subjective:   Jasmin [name] is a 85 y.o. female who presents for Medicare Annual (Subsequent) preventive examination.  Review of Systems:   Cardiac Risk Factors include: advanced age (>80men, >38 women);hypertension;dyslipidemia Sleep patterns: has frequent nighttime awakenings, feels rested on waking, gets up 3-4 times nightly to void and sleeps 8 hours nightly.    Home Safety/Smoke Alarms: Feels safe in home. Smoke alarms in place.  Living environment; residence and Firearm Safety: split level / walkout. Lives with husband, needs tub bench for DME, good support system Seat Belt Safety/Bike Helmet: Wears seat belt.     Objective:     Vitals: BP 128/75   Pulse (!) 54   Temp 98.4 F (36.9 C)   Resp 17   Ht 5\' 6"  (1.676 m)   Wt 219 lb (99.3 kg)   SpO2 100%   BMI 35.35 kg/m   Body mass index is 35.35 kg/m.  Advanced Directives 03/08/2019 09/24/2018 03/02/2018 07/13/2017 02/28/2017 12/10/2015 09/05/2014  Does Patient Have a Medical Advance Directive? Yes No Yes No No No No  Type of Paramedic of [place];Living will - [place];Living will - - - -  Copy of [place] in Chart? No - copy requested - No - copy requested - - - -  Would patient like information on creating a medical advance directive? - - - - - Yes - Educational materials given No - patient declined information    Tobacco Social History   Tobacco Use  Smoking Status Never Smoker  Smokeless Tobacco Never Used     Counseling given: Not Answered  Past Medical History:  Diagnosis Date  . Contact dermatitis and other eczema, due to unspecified cause   . Diverticulosis of colon   . Esophageal stricture   . GERD with stricture    Dr Henrene Pastor  . Hemorrhoids   . Hiatal hernia   . Hyperlipidemia   . Hypertension   . Onychomycosis   . Personal history of urinary calculi   . Polycystic kidney   . Tubulovillous adenoma polyp of colon   . Unspecified  vitamin D deficiency   . Vitamin B12 deficiency 2009   Borderline   Past Surgical History:  Procedure Laterality Date  . CATARACT EXTRACTION Bilateral 2017  . DILATION AND CURETTAGE OF UTERUS  1977  . TOE SURGERY  2012   Right foot   . TONSILLECTOMY     as child   Family History  Problem Relation Age of Onset  . Hypertension Father   . Colon cancer Neg Hx   . Esophageal cancer Neg Hx   . Rectal cancer Neg Hx   . Stomach cancer Neg Hx    Social History   Socioeconomic History  . Marital status: Married    Spouse name: Not on file  . Number of children: 2  . Years of education: Not on file  . Highest education level: Not on file  Occupational History  . Occupation: RETIRED  Social Needs  . Financial resource strain: Not hard at all  . Food insecurity    Worry: Never true    Inability: Never true  . Transportation needs    Medical: No    Non-medical: No  Tobacco Use  . Smoking status: Never Smoker  . Smokeless tobacco: Never Used  Substance and Sexual Activity  . Alcohol use: No    Alcohol/week: 0.0 standard drinks  . Drug use: No  . Sexual activity: Yes  Lifestyle  . Physical activity    Days per week: 2 days    Minutes per session: 20 min  . Stress: Not at all  Relationships  . Social connections    Talks on phone: More than three times a week    Gets together: More than three times a week    Attends religious service: More than 4 times per year    Active member of club or organization: Yes    Attends meetings of clubs or organizations: More than 4 times per year    Relationship status: Married  Other Topics Concern  . Not on file  Social History Narrative   GYN: Dr Philis Pique      FAMILY HISTORY HYPERTENSION      Regular exercise - NO    Outpatient Encounter Medications as of 03/08/2019  Medication Sig  . Cholecalciferol 1000 units tablet Take 2 tablets (2,000 Units total) by mouth daily.  . hydrocortisone (ANUSOL-HC) 25 MG suppository Place 1  suppository (25 mg total) rectally at bedtime as needed for hemorrhoids or anal itching.  . lovastatin (MEVACOR) 40 MG tablet Take 1 tablet (40 mg total) by mouth at bedtime.  . metoprolol tartrate (LOPRESSOR) 50 MG tablet Take 1 tablet (50 mg total) by mouth 2 (two) times daily.  . polyethylene glycol powder (GLYCOLAX/MIRALAX) powder Take 17 g by mouth 2 (two) times daily as needed for mild constipation, moderate constipation or severe constipation. (Patient taking differently: Take 17 g by mouth daily as needed for mild constipation, moderate constipation or severe constipation. )  . spironolactone (ALDACTONE) 50 MG tablet Take 1 tablet (50 mg total) by mouth daily.  Marland Kitchen tiZANidine (ZANAFLEX) 2 MG tablet Take 1-2 tablets (2-4 mg total) by mouth at bedtime as needed for muscle spasms.  . traMADol-acetaminophen (ULTRACET) 37.5-325 MG tablet Take 1-2 tablets by mouth every 6 (six) hours as needed. (Patient taking differently: Take 1-2 tablets by mouth every 6 (six) hours as needed for moderate pain or severe pain. )  . [DISCONTINUED] allopurinol (ZYLOPRIM) 100 MG tablet Take 1 tablet (100 mg total) by mouth daily. (Patient not taking: Reported on 03/08/2019)  . [DISCONTINUED] colchicine 0.6 MG tablet Take 1 tablet (0.6 mg total) by mouth 2 (two) times daily. (Patient not taking: Reported on 03/08/2019)  . [DISCONTINUED] gabapentin (NEURONTIN) 100 MG capsule    No facility-administered encounter medications on file as of 03/08/2019.     Activities of Daily Living In your present state of health, do you have any difficulty performing the following activities: 03/08/2019  Hearing? N  Vision? N  Difficulty concentrating or making decisions? N  Walking or climbing stairs? N  Dressing or bathing? N  Doing errands, shopping? N  Preparing Food and eating ? N  Using the Toilet? N  In the past six months, have you accidently leaked urine? N  Do you have problems with loss of bowel control? N  Managing your  Medications? N  Managing your Finances? N  Housekeeping or managing your Housekeeping? N  Some recent data might be hidden    Patient Care Team: Plotnikov, Evie Lacks, MD as PCP - General Henrene Pastor Docia Chuck, MD (Gastroenterology) Janyth Pupa, DO as Consulting Physician (Obstetrics and Gynecology) Leandrew Koyanagi, MD as Attending Physician (Orthopedic Surgery) [facility]    Assessment:   This is a routine wellness examination for Jasmin [name]. Physical assessment deferred to PCP.  Exercise Activities and Dietary recommendations Current Exercise Habits: Home exercise routine, Type of  exercise: walking, Time (Minutes): 20, Frequency (Times/Week): 2, Weekly Exercise (Minutes/Week): 40, Intensity: Mild, Exercise limited by: orthopedic condition(s) Diet (meal preparation, eat out, water intake, caffeinated beverages, dairy products, fruits and vegetables): in general, a "healthy" diet  , well balanced   Reviewed heart healthy and diabetic diet. Encouraged patient to increase daily water and healthy fluid intake.  Goals    . Exercise 150 minutes per week (moderate activity)     Continue with silver sneakers; and may consider zumba or water areobics Try to build up to 5 days a week to help with weight loss Walking counts!!     . Patient Stated     Continue to do the classes at [employer]. I will increase the amount of fluids and water I drink. I plan to buy flavor packets to put in the water.        Fall Risk Fall Risk  03/08/2019 03/02/2018 08/30/2017 12/10/2015 01/29/2015  Falls in the past year? 0 No No No No  Number falls in past yr: 0 - - - -  Risk for fall due to : Impaired mobility;Impaired balance/gait - - - -  Follow up Falls prevention discussed - - - -    Depression Screen PHQ 2/9 Scores 03/08/2019 03/02/2018 08/30/2017 12/10/2015  PHQ - 2 Score 0 0 0 0     Cognitive Function MMSE - Mini Mental State Exam 03/02/2018  Orientation to time 5  Orientation to Place 5   Registration 3  Attention/ Calculation 3  Recall 3  Language- name 2 objects 2  Language- repeat 1  Language- follow 3 step command 3  Language- read & follow direction 1  Write a sentence 1  Copy design 1  Total score 28       Ad8 score reviewed for issues:  Issues making decisions: no  Less interest in hobbies / activities: no  Repeats questions, stories (family complaining): no  Trouble using ordinary gadgets (microwave, computer, phone):no  Forgets the month or year: no  Mismanaging finances: no  Remembering appts: no  Daily problems with thinking and/or memory: no Ad8 score is= 0  Immunization History  Administered Date(s) Administered  . Influenza Split 05/20/2011, 05/31/2012  . Influenza Whole 08/24/2005, 06/21/2008, 06/10/2009  . Influenza, High Dose Seasonal PF 06/15/2017, 06/02/2018  . Influenza,inj,Quad PF,6+ Mos 05/22/2013, 05/29/2014, 06/04/2015, 04/16/2016  . Pneumococcal Conjugate-13 09/21/2013  . Pneumococcal Polysaccharide-23 08/25/2004, 11/12/2009, 05/31/2012  . Td 03/02/2013  . Zoster Recombinat (Shingrix) 03/02/2017, 06/13/2017   Screening Tests Health Maintenance  Topic Date Due  . INFLUENZA VACCINE  03/24/2019  . COLONOSCOPY  03/14/2020  . TETANUS/TDAP  03/03/2023  . DEXA SCAN  Completed  . PNA vac Low Risk Adult  Completed      Plan:     Order for tub bench placed for patient's safety and to maintain independence of ADLs  Reviewed health maintenance screenings with patient today and relevant education, vaccines, and/or referrals were provided.   Continue doing brain stimulating activities (puzzles, reading, adult coloring books, staying active) to keep memory sharp.   Continue to eat heart healthy diet (full of fruits, vegetables, whole grains, lean protein, water--limit salt, fat, and sugar intake) and increase physical activity as tolerated.  I have personally reviewed and noted the following in the patient's chart:   .  Medical and social history . Use of alcohol, tobacco or illicit drugs  . Current medications and supplements . Functional ability and status . Nutritional status . Physical activity .  Advanced directives . List of other physicians . Vitals . Screenings to include cognitive, depression, and falls . Referrals and appointments  In addition, I have reviewed and discussed with patient certain preventive protocols, quality metrics, and best practice recommendations. A written personalized care plan for preventive services as well as general preventive health recommendations were provided to patient.     Michiel Cowboy, RN  03/08/2019   Medical screening examination/treatment/procedure(s) were performed by non-physician practitioner and as supervising physician I was immediately available for consultation/collaboration. I agree with above. Lew Dawes, MD

## 2019-03-08 ENCOUNTER — Encounter: Payer: Self-pay | Admitting: Internal Medicine

## 2019-03-08 ENCOUNTER — Other Ambulatory Visit: Payer: Self-pay

## 2019-03-08 ENCOUNTER — Ambulatory Visit (INDEPENDENT_AMBULATORY_CARE_PROVIDER_SITE_OTHER): Payer: Medicare Other | Admitting: *Deleted

## 2019-03-08 ENCOUNTER — Ambulatory Visit (INDEPENDENT_AMBULATORY_CARE_PROVIDER_SITE_OTHER): Payer: Medicare Other | Admitting: Internal Medicine

## 2019-03-08 VITALS — BP 128/75 | HR 54 | Temp 98.4°F | Resp 17 | Ht 66.0 in | Wt 219.0 lb

## 2019-03-08 DIAGNOSIS — E785 Hyperlipidemia, unspecified: Secondary | ICD-10-CM

## 2019-03-08 DIAGNOSIS — E559 Vitamin D deficiency, unspecified: Secondary | ICD-10-CM

## 2019-03-08 DIAGNOSIS — E538 Deficiency of other specified B group vitamins: Secondary | ICD-10-CM | POA: Diagnosis not present

## 2019-03-08 DIAGNOSIS — I10 Essential (primary) hypertension: Secondary | ICD-10-CM

## 2019-03-08 DIAGNOSIS — Z0000 Encounter for general adult medical examination without abnormal findings: Secondary | ICD-10-CM | POA: Diagnosis not present

## 2019-03-08 DIAGNOSIS — Z9181 History of falling: Secondary | ICD-10-CM

## 2019-03-08 DIAGNOSIS — M1000 Idiopathic gout, unspecified site: Secondary | ICD-10-CM

## 2019-03-08 MED ORDER — COLCHICINE 0.6 MG PO TABS: ORAL_TABLET | ORAL | 1 refills | Status: DC

## 2019-03-08 MED ORDER — DICLOFENAC SODIUM 1 % TD GEL: 1.0000 "application " | Freq: Four times a day (QID) | TRANSDERMAL | 3 refills | Status: DC

## 2019-03-08 MED ORDER — ALLOPURINOL 100 MG PO TABS: 50.0000 mg | ORAL_TABLET | Freq: Every day | ORAL | 3 refills | Status: DC

## 2019-03-08 NOTE — Progress Notes (Signed)
Subjective:  Patient ID: Jasmin [name], female    DOB: 1934-03-14  Age: 85 y.o. MRN: [id number]  CC: No chief complaint on file.   HPI Julietta Batterman Goins presents for  B feet redness and pain - ?gout F/u HTN, dyslipidemia, B12 and Vit D def  Outpatient Medications Prior to Visit  Medication Sig Dispense Refill  . Cholecalciferol 1000 units tablet Take 2 tablets (2,000 Units total) by mouth daily. 100 tablet 5  . hydrocortisone (ANUSOL-HC) 25 MG suppository Place 1 suppository (25 mg total) rectally at bedtime as needed for hemorrhoids or anal itching. 30 suppository 0  . lovastatin (MEVACOR) 40 MG tablet Take 1 tablet (40 mg total) by mouth at bedtime. 90 tablet 1  . metoprolol tartrate (LOPRESSOR) 50 MG tablet Take 1 tablet (50 mg total) by mouth 2 (two) times daily. 180 tablet 1  . polyethylene glycol powder (GLYCOLAX/MIRALAX) powder Take 17 g by mouth 2 (two) times daily as needed for mild constipation, moderate constipation or severe constipation. (Patient taking differently: Take 17 g by mouth daily as needed for mild constipation, moderate constipation or severe constipation. ) 500 g 3  . spironolactone (ALDACTONE) 50 MG tablet Take 1 tablet (50 mg total) by mouth daily. 90 tablet 3  . tiZANidine (ZANAFLEX) 2 MG tablet Take 1-2 tablets (2-4 mg total) by mouth at bedtime as needed for muscle spasms. 60 tablet 1  . traMADol-acetaminophen (ULTRACET) 37.5-325 MG tablet Take 1-2 tablets by mouth every 6 (six) hours as needed. (Patient taking differently: Take 1-2 tablets by mouth every 6 (six) hours as needed for moderate pain or severe pain. ) 30 tablet 0  . colchicine 0.6 MG tablet Take 1 tablet (0.6 mg total) by mouth 2 (two) times daily. (Patient not taking: Reported on 03/08/2019) 20 tablet 0   No facility-administered medications prior to visit.     ROS: Review of Systems  Constitutional: Negative for activity change, appetite change, chills, fatigue and unexpected weight  change.  HENT: Negative for congestion, mouth sores and sinus pressure.   Eyes: Negative for visual disturbance.  Respiratory: Negative for cough and chest tightness.   Gastrointestinal: Negative for abdominal pain and nausea.  Genitourinary: Negative for difficulty urinating, frequency and vaginal pain.  Musculoskeletal: Positive for arthralgias. Negative for back pain and gait problem.  Skin: Negative for pallor and rash.  Neurological: Negative for dizziness, tremors, weakness, numbness and headaches.  Psychiatric/Behavioral: Negative for confusion and sleep disturbance.    Objective:  BP 128/75   Pulse (!) 54   Temp 98.4 F (36.9 C) (Oral)   Ht 5\' 6"  (1.676 m)   Wt 219 lb (99.3 kg)   SpO2 100%   BMI 35.35 kg/m   BP Readings from Last 3 Encounters:  03/08/19 128/75  03/08/19 128/75  10/04/18 132/78    Wt Readings from Last 3 Encounters:  03/08/19 219 lb (99.3 kg)  03/08/19 219 lb (99.3 kg)  10/04/18 216 lb (98 kg)    Physical Exam Constitutional:      General: She is not in acute distress.    Appearance: She is well-developed.  HENT:     Head: Normocephalic.     Right Ear: External ear normal.     Left Ear: External ear normal.     Nose: Nose normal.  Eyes:     General:        Right eye: No discharge.        Left eye: No discharge.  Conjunctiva/sclera: Conjunctivae normal.     Pupils: Pupils are equal, round, and reactive to light.  Neck:     Musculoskeletal: Normal range of motion and neck supple.     Thyroid: No thyromegaly.     Vascular: No JVD.     Trachea: No tracheal deviation.  Cardiovascular:     Rate and Rhythm: Normal rate and regular rhythm.     Heart sounds: Normal heart sounds.  Pulmonary:     Effort: No respiratory distress.     Breath sounds: No stridor. No wheezing.  Abdominal:     General: Bowel sounds are normal. There is no distension.     Palpations: Abdomen is soft. There is no mass.     Tenderness: There is no abdominal  tenderness. There is no guarding or rebound.  Musculoskeletal:        General: Tenderness present.  Lymphadenopathy:     Cervical: No cervical adenopathy.  Skin:    Findings: No erythema or rash.  Neurological:     Mental Status: She is oriented to person, place, and time.     Cranial Nerves: No cranial nerve deficit.     Motor: No abnormal muscle tone.     Coordination: Coordination normal.     Gait: Gait abnormal.     Deep Tendon Reflexes: Reflexes normal.  Psychiatric:        Behavior: Behavior normal.        Thought Content: Thought content normal.        Judgment: Judgment normal.    B big toes are sensitive   Lab Results  Component Value Date   WBC 3.1 (L) 02/16/2017   HGB 13.6 07/13/2017   HCT 40.0 07/13/2017   PLT 98.0 (L) 02/16/2017   GLUCOSE 106 (H) 04/04/2018   CHOL 170 02/16/2017   TRIG 126.0 02/16/2017   HDL 55.80 02/16/2017   LDLDIRECT 139.3 05/31/2012   LDLCALC 89 02/16/2017   ALT 11 02/16/2017   AST 16 02/16/2017   NA 139 04/04/2018   K 3.8 04/04/2018   CL 102 04/04/2018   CREATININE 1.35 (H) 04/04/2018   BUN 24 (H) 04/04/2018   CO2 29 04/04/2018   TSH 1.02 02/16/2017   INR 1.16 08/16/2009    No results found.  Assessment & Plan:   There are no diagnoses linked to this encounter.   No orders of the defined types were placed in this encounter.    Follow-up: No follow-ups on file.  Walker Kehr, MD

## 2019-03-08 NOTE — Patient Instructions (Addendum)
Labs in 1 month

## 2019-03-08 NOTE — Assessment & Plan Note (Signed)
B complex

## 2019-03-08 NOTE — Assessment & Plan Note (Signed)
Worse Colchicine prn Medrol pack prn Allopurinol 50 mg/d Voltaren gel prn

## 2019-03-08 NOTE — Patient Instructions (Addendum)
Low-Purine Eating Plan A low-purine eating plan involves making food choices to limit your intake of purine. Purine is a kind of uric acid. Too much uric acid in your blood can cause certain conditions, such as gout and kidney stones. Eating a low-purine diet can help control these conditions. What are tips for following this plan? Reading food labels   Avoid foods with saturated or Trans fat.  Check the ingredient list of grains-based foods, such as bread and cereal, to make sure that they contain whole grains.  Check the ingredient list of sauces or soups to make sure they do not contain meat or fish.  When choosing soft drinks, check the ingredient list to make sure they do not contain high-fructose corn syrup. Shopping  Buy plenty of fresh fruits and vegetables.  Avoid buying canned or fresh fish.  Buy dairy products labeled as low-fat or nonfat.  Avoid buying premade or processed foods. These foods are often high in fat, salt (sodium), and added sugar. Cooking  Use olive oil instead of butter when cooking. Oils like olive oil, canola oil, and sunflower oil contain healthy fats. Meal planning  Learn which foods do or do not affect you. If you find out that a food tends to cause your gout symptoms to flare up, avoid eating that food. You can enjoy foods that do not cause problems. If you have any questions about a food item, talk with your dietitian or health care provider.  Limit foods high in fat, especially saturated fat. Fat makes it harder for your body to get rid of uric acid.  Choose foods that are lower in fat and are lean sources of protein. General guidelines  Limit alcohol intake to no more than 1 drink a day for nonpregnant women and 2 drinks a day for men. One drink equals 12 oz of beer, 5 oz of Gilda Abboud, or 1 oz of hard liquor. Alcohol can affect the way your body gets rid of uric acid.  Drink plenty of water to keep your urine clear or pale yellow. Fluids can help  remove uric acid from your body.  If directed by your health care provider, take a vitamin C supplement.  Work with your health care provider and dietitian to develop a plan to achieve or maintain a healthy weight. Losing weight can help reduce uric acid in your blood. What foods are recommended? The items listed may not be a complete list. Talk with your dietitian about what dietary choices are best for you. Foods low in purines Foods low in purines do not need to be limited. These include:  All fruits.  All low-purine vegetables, pickles, and olives.  Breads, pasta, rice, cornbread, and popcorn. Cake and other baked goods.  All dairy foods.  Eggs, nuts, and nut butters.  Spices and condiments, such as salt, herbs, and vinegar.  Plant oils, butter, and margarine.  Water, sugar-free soft drinks, tea, coffee, and cocoa.  Vegetable-based soups, broths, sauces, and gravies. Foods moderate in purines Foods moderate in purines should be limited to the amounts listed.   cup of asparagus, cauliflower, spinach, mushrooms, or green peas, each day.  2/3 cup uncooked oatmeal, each day.   cup dry wheat bran or wheat germ, each day.  2-3 ounces of meat or poultry, each day.  4-6 ounces of shellfish, such as crab, lobster, oysters, or shrimp, each day.  1 cup cooked beans, peas, or lentils, each day.  Soup, broths, or bouillon made from meat or   fish. Limit these foods as much as possible. What foods are not recommended? The items listed may not be a complete list. Talk with your dietitian about what dietary choices are best for you. Limit your intake of foods high in purines, including:  Beer and other alcohol.  Meat-based gravy or sauce.  Canned or fresh fish, such as: ? Anchovies, sardines, herring, and tuna. ? Mussels and scallops. ? Codfish, trout, and haddock.  Berniece Salines.  Organ meats, such as: ? Liver or kidney. ? Tripe. ? Sweetbreads (thymus gland or pancreas).   Wild Clinical biochemist.  Yeast or yeast extract supplements.  Drinks sweetened with high-fructose corn syrup. Summary  Eating a low-purine diet can help control conditions caused by too much uric acid in the body, such as gout or kidney stones.  Choose low-purine foods, limit alcohol, and limit foods high in fat.  You will learn over time which foods do or do not affect you. If you find out that a food tends to cause your gout symptoms to flare up, avoid eating that food. This information is not intended to replace advice given to you by your health care provider. Make sure you discuss any questions you have with your health care provider. Document Released: 12/04/2010 Document Revised: 07/22/2017 Document Reviewed: 09/22/2016 Elsevier Patient Education  2020 [employer].  If you cannot attend class in person, you can still exercise at home. Video taped versions of AHOY classes are shown on [employer] (GTN) at 8 am and 1 pm Mondays through Fridays. You can also purchase a copy of the AHOY DVD by calling [place] (GTN) [employer]. GTN is available on [employer] channel 13 with a digital cable box and on [employer] channel 31. GTN is also available on [employer], channel 99. To view GTN, go to channel 99, press OK, select [place], then select GTN to start the channel.  The [place] is available Monday through Friday, 9:00 a.m. - 5:00 p.m. Call Center Specialists provide information and referral services to aging adults 65+ in [place]. If there are waiting lists for community services, or if services are not available, [facility] connects clients with [facility] volunteers, or other caring individuals in the community who provide services such as respite care, wheelchair ramp construction, friendly visits, or transportation assistance.  Call Center Specialists consider it an honor and privilege to pray with callers. Contact the Call  Center at [phone] or email [email or web address].  Continue doing brain stimulating activities (puzzles, reading, adult coloring books, staying active) to keep memory sharp.   Web-site for free memory games: [email or web address].[email or web address]    Jasmin [name] , Thank you for taking time to come for your Medicare Wellness Visit. I appreciate your ongoing commitment to your health goals. Please review the following plan we discussed and let me know if I can assist you in the future.   These are the goals we discussed: Goals    . Exercise 150 minutes per week (moderate activity)     Continue with silver sneakers; and may consider zumba or water areobics Try to build up to 5 days a week to help with weight loss Walking counts!!     . Patient Stated     Continue to do the classes at [employer]. I will increase the amount of fluids and water I drink. I plan to buy flavor packets to put in the water.        This is a list of the  screening recommended for you and due dates:  Health Maintenance  Topic Date Due  . Flu Shot  03/24/2019  . Colon Cancer Screening  03/14/2020  . Tetanus Vaccine  03/03/2023  . DEXA scan (bone density measurement)  Completed  . Pneumonia vaccines  Completed    Preventive Care 22 Years and Older, Female Preventive care refers to lifestyle choices and visits with your health care provider that can promote health and wellness. This includes:  A yearly physical exam. This is also called an annual well check.  Regular dental and eye exams.  Immunizations.  Screening for certain conditions.  Healthy lifestyle choices, such as diet and exercise. What can I expect for my preventive care visit? Physical exam Your health care provider will check:  Height and weight. These may be used to calculate body mass index (BMI), which is a measurement that tells if you are at a healthy weight.  Heart rate and blood pressure.  Your skin for abnormal  spots. Counseling Your health care provider may ask you questions about:  Alcohol, tobacco, and drug use.  Emotional well-being.  Home and relationship well-being.  Sexual activity.  Eating habits.  History of falls.  Memory and ability to understand (cognition).  Work and work Statistician.  Pregnancy and menstrual history. What immunizations do I need?  Influenza (flu) vaccine  This is recommended every year. Tetanus, diphtheria, and pertussis (Tdap) vaccine  You may need a Td booster every 10 years. Varicella (chickenpox) vaccine  You may need this vaccine if you have not already been vaccinated. Zoster (shingles) vaccine  You may need this after age 69. Pneumococcal conjugate (PCV13) vaccine  One dose is recommended after age 53. Pneumococcal polysaccharide (PPSV23) vaccine  One dose is recommended after age 46. Measles, mumps, and rubella (MMR) vaccine  You may need at least one dose of MMR if you were born in 1957 or later. You may also need a second dose. Meningococcal conjugate (MenACWY) vaccine  You may need this if you have certain conditions. Hepatitis A vaccine  You may need this if you have certain conditions or if you travel or work in places where you may be exposed to hepatitis A. Hepatitis B vaccine  You may need this if you have certain conditions or if you travel or work in places where you may be exposed to hepatitis B. Haemophilus influenzae type b (Hib) vaccine  You may need this if you have certain conditions. You may receive vaccines as individual doses or as more than one vaccine together in one shot (combination vaccines). Talk with your health care provider about the risks and benefits of combination vaccines. What tests do I need? Blood tests  Lipid and cholesterol levels. These may be checked every 5 years, or more frequently depending on your overall health.  Hepatitis C test.  Hepatitis B test. Screening  Lung cancer  screening. You may have this screening every year starting at age 59 if you have a 30-pack-year history of smoking and currently smoke or have quit within the past 15 years.  Colorectal cancer screening. All adults should have this screening starting at age 74 and continuing until age 57. Your health care provider may recommend screening at age 11 if you are at increased risk. You will have tests every 1-10 years, depending on your results and the type of screening test.  Diabetes screening. This is done by checking your blood sugar (glucose) after you have not eaten for a while (fasting).  You may have this done every 1-3 years.  Mammogram. This may be done every 1-2 years. Talk with your health care provider about how often you should have regular mammograms.  BRCA-related cancer screening. This may be done if you have a family history of breast, ovarian, tubal, or peritoneal cancers. Other tests  Sexually transmitted disease (STD) testing.  Bone density scan. This is done to screen for osteoporosis. You may have this done starting at age 7. Follow these instructions at home: Eating and drinking  Eat a diet that includes fresh fruits and vegetables, whole grains, lean protein, and low-fat dairy products. Limit your intake of foods with high amounts of sugar, saturated fats, and salt.  Take vitamin and mineral supplements as recommended by your health care provider.  Do not drink alcohol if your health care provider tells you not to drink.  If you drink alcohol: ? Limit how much you have to 0-1 drink a day. ? Be aware of how much alcohol is in your drink. In the [place] one drink equals one 12 oz bottle of beer (355 mL), one 5 oz glass of Willett Lefeber (148 mL), or one 1 oz glass of hard liquor (44 mL). Lifestyle  Take daily care of your teeth and gums.  Stay active. Exercise for at least 30 minutes on 5 or more days each week.  Do not use any products that contain nicotine or tobacco, such as  cigarettes, e-cigarettes, and chewing tobacco. If you need help quitting, ask your health care provider.  If you are sexually active, practice safe sex. Use a condom or other form of protection in order to prevent STIs (sexually transmitted infections).  Talk with your health care provider about taking a low-dose aspirin or statin. What's next?  Go to your health care provider once a year for a well check visit.  Ask your health care provider how often you should have your eyes and teeth checked.  Stay up to date on all vaccines. This information is not intended to replace advice given to you by your health care provider. Make sure you discuss any questions you have with your health care provider. Document Released: 09/05/2015 Document Revised: 08/03/2018 Document Reviewed: 08/03/2018 Elsevier Patient Education  2020 [employer].

## 2019-03-08 NOTE — Assessment & Plan Note (Signed)
Vit D 

## 2019-03-12 ENCOUNTER — Telehealth: Payer: Self-pay | Admitting: *Deleted

## 2019-03-12 NOTE — Telephone Encounter (Signed)
Called patient in response to her VM to nurse. Nurse discussed with patient her prescribed medication for her gout and provided education. Patient stated that her insurance did not cover colchicine and as a result she could not afford the medication. She will continue to take allopurinol. Nurse also previously provided patient with low purine diet to help decrease gout flare ups. Patient verbalized understanding.

## 2019-03-12 NOTE — Assessment & Plan Note (Signed)
On Spironolactone and metoprolol

## 2019-03-12 NOTE — Assessment & Plan Note (Signed)
Continue lovastatin 

## 2019-04-04 ENCOUNTER — Other Ambulatory Visit: Payer: Self-pay | Admitting: Internal Medicine

## 2019-04-09 ENCOUNTER — Telehealth: Payer: Self-pay

## 2019-04-09 ENCOUNTER — Other Ambulatory Visit (INDEPENDENT_AMBULATORY_CARE_PROVIDER_SITE_OTHER): Payer: Medicare Other

## 2019-04-09 DIAGNOSIS — I10 Essential (primary) hypertension: Secondary | ICD-10-CM | POA: Diagnosis not present

## 2019-04-09 DIAGNOSIS — D708 Other neutropenia: Secondary | ICD-10-CM | POA: Diagnosis not present

## 2019-04-09 LAB — HEPATIC FUNCTION PANEL
ALT: 11 U/L (ref 0–35)
AST: 14 U/L (ref 0–37)
Albumin: 4.4 g/dL (ref 3.5–5.2)
Alkaline Phosphatase: 53 U/L (ref 39–117)
Bilirubin, Direct: 0.1 mg/dL (ref 0.0–0.3)
Total Bilirubin: 0.5 mg/dL (ref 0.2–1.2)
Total Protein: 7.1 g/dL (ref 6.0–8.3)

## 2019-04-09 LAB — CBC WITH DIFFERENTIAL/PLATELET
Basophils Absolute: 0 10*3/uL (ref 0.0–0.1)
Basophils Relative: 1.4 % (ref 0.0–3.0)
Eosinophils Absolute: 0 10*3/uL (ref 0.0–0.7)
Eosinophils Relative: 0.5 % (ref 0.0–5.0)
HCT: 41.7 % (ref 36.0–46.0)
Hemoglobin: 14 g/dL (ref 12.0–15.0)
Lymphocytes Relative: 52.2 % — ABNORMAL HIGH (ref 12.0–46.0)
Lymphs Abs: 1.6 10*3/uL (ref 0.7–4.0)
MCHC: 33.6 g/dL (ref 30.0–36.0)
MCV: 91.7 fl (ref 78.0–100.0)
Monocytes Absolute: 0.5 10*3/uL (ref 0.1–1.0)
Monocytes Relative: 14.8 % — ABNORMAL HIGH (ref 3.0–12.0)
Neutro Abs: 0.9 10*3/uL — ABNORMAL LOW (ref 1.4–7.7)
Neutrophils Relative %: 31.1 % — ABNORMAL LOW (ref 43.0–77.0)
Platelets: 93 10*3/uL — ABNORMAL LOW (ref 150.0–400.0)
RBC: 4.54 Mil/uL (ref 3.87–5.11)
RDW: 14.6 % (ref 11.5–15.5)
WBC: 3 10*3/uL — ABNORMAL LOW (ref 4.0–10.5)

## 2019-04-09 LAB — BASIC METABOLIC PANEL
BUN: 19 mg/dL (ref 6–23)
CO2: 27 mEq/L (ref 19–32)
Calcium: 10 mg/dL (ref 8.4–10.5)
Chloride: 102 mEq/L (ref 96–112)
Creatinine, Ser: 1.26 mg/dL — ABNORMAL HIGH (ref 0.40–1.20)
GFR: 49.03 mL/min — ABNORMAL LOW (ref >60.00)
Glucose, Bld: 104 mg/dL — ABNORMAL HIGH (ref 70–99)
Potassium: 3.9 mEq/L (ref 3.5–5.1)
Sodium: 137 mEq/L (ref 135–145)

## 2019-04-09 LAB — URIC ACID: Uric Acid, Serum: 5.4 mg/dL (ref 2.4–7.0)

## 2019-04-09 NOTE — Telephone Encounter (Signed)
Copied from [place] [id number]. Topic: General - Other >> Apr 09, 2019  9:57 AM Parke Poisson wrote: Reason for CRM: Pt states MD advised that she will need to come in for lab work before next appointment. She would like to know when she can come in and do this

## 2019-04-09 NOTE — Telephone Encounter (Signed)
Patient informed she is due to come in any time for her labs and she can go straight to the lab. Patient stated understanding

## 2019-04-11 ENCOUNTER — Ambulatory Visit (INDEPENDENT_AMBULATORY_CARE_PROVIDER_SITE_OTHER): Payer: Medicare Other | Admitting: *Deleted

## 2019-04-11 DIAGNOSIS — Z23 Encounter for immunization: Secondary | ICD-10-CM | POA: Diagnosis not present

## 2019-05-03 ENCOUNTER — Other Ambulatory Visit: Payer: Self-pay

## 2019-05-03 ENCOUNTER — Ambulatory Visit: Payer: Medicare Other | Admitting: Podiatry

## 2019-05-03 ENCOUNTER — Ambulatory Visit: Payer: Medicare Other

## 2019-05-03 ENCOUNTER — Encounter: Payer: Self-pay | Admitting: Podiatry

## 2019-05-03 VITALS — BP 148/78 | HR 69 | Resp 16

## 2019-05-03 DIAGNOSIS — M778 Other enthesopathies, not elsewhere classified: Secondary | ICD-10-CM

## 2019-05-03 DIAGNOSIS — G5793 Unspecified mononeuropathy of bilateral lower limbs: Secondary | ICD-10-CM | POA: Diagnosis not present

## 2019-05-03 MED ORDER — GABAPENTIN 100 MG PO CAPS: 100.0000 mg | ORAL_CAPSULE | Freq: Every day | ORAL | 1 refills | Status: DC

## 2019-05-05 ENCOUNTER — Encounter: Payer: Self-pay | Admitting: Podiatry

## 2019-05-05 NOTE — Progress Notes (Signed)
Subjective:  Patient ID: Jasmin [name], female    DOB: Feb 22, 1934,  MRN: [id number] HPI Chief Complaint  Patient presents with  . Toe Pain    Hallux bilateral (tip) - episodes of redness and tenderness x several months (patient rubs the tips of her toes around the toenail), ER said she had gout and Rx'd colchicine, also using voltaren gel  . New Patient (Initial Visit)    85 y.o. female presents with the above complaint.   ROS: Denies fever chills nausea vomiting muscle aches pains calf pain back pain chest pain shortness of breath.  Past Medical History:  Diagnosis Date  . Contact dermatitis and other eczema, due to unspecified cause   . Diverticulosis of colon   . Esophageal stricture   . GERD with stricture    Dr Henrene Pastor  . Hemorrhoids   . Hiatal hernia   . Hyperlipidemia   . Hypertension   . Onychomycosis   . Personal history of urinary calculi   . Polycystic kidney   . Tubulovillous adenoma polyp of colon   . Unspecified vitamin D deficiency   . Vitamin B12 deficiency 2009   Borderline   Past Surgical History:  Procedure Laterality Date  . CATARACT EXTRACTION Bilateral 2017  . DILATION AND CURETTAGE OF UTERUS  1977  . TOE SURGERY  2012   Right foot   . TONSILLECTOMY     as child    Current Outpatient Medications:  .  allopurinol (ZYLOPRIM) 100 MG tablet, Take 0.5 tablets (50 mg total) by mouth daily., Disp: 45 tablet, Rfl: 3 .  Cholecalciferol 1000 units tablet, Take 2 tablets (2,000 Units total) by mouth daily., Disp: 100 tablet, Rfl: 5 .  colchicine 0.6 MG tablet, Take two tablets prn gout attack.Then take another one in 1-2 hrs. Do not repeat for 3 days., Disp: 18 tablet, Rfl: 1 .  diclofenac sodium (VOLTAREN) 1 % GEL, Apply 1 application topically 4 (four) times daily., Disp: 100 g, Rfl: 3 .  famotidine (PEPCID) 40 MG tablet, , Disp: , Rfl:  .  gabapentin (NEURONTIN) 100 MG capsule, Take 1 capsule (100 mg total) by mouth at bedtime., Disp: 30 capsule,  Rfl: 1 .  hydrocortisone (ANUSOL-HC) 25 MG suppository, Place 1 suppository (25 mg total) rectally at bedtime as needed for hemorrhoids or anal itching., Disp: 30 suppository, Rfl: 0 .  lovastatin (MEVACOR) 40 MG tablet, TAKE 1 TABLET BY MOUTH AT  BEDTIME, Disp: 90 tablet, Rfl: 2 .  metoprolol tartrate (LOPRESSOR) 50 MG tablet, TAKE 1 TABLET BY MOUTH  TWICE DAILY, Disp: 180 tablet, Rfl: 2 .  polyethylene glycol powder (GLYCOLAX/MIRALAX) powder, Take 17 g by mouth 2 (two) times daily as needed for mild constipation, moderate constipation or severe constipation. (Patient taking differently: Take 17 g by mouth daily as needed for mild constipation, moderate constipation or severe constipation. ), Disp: 500 g, Rfl: 3 .  spironolactone (ALDACTONE) 50 MG tablet, Take 1 tablet (50 mg total) by mouth daily., Disp: 90 tablet, Rfl: 3  Allergies  Allergen Reactions  . Doxycycline     nausea  . Hctz [Hydrochlorothiazide]     gout  . Omeprazole     diarrhea  . Sulfonamide Derivatives     Per pt unknown  . Clarithromycin Rash  . Penicillins Rash    Did it involve swelling of the face/tongue/throat, SOB, or low BP? Yes Did it involve sudden or severe rash/hives, skin peeling, or any reaction on the inside of your mouth or  nose? No Did you need to seek medical attention at a hospital or doctor's office? No When did it last happen?childhood If all above answers are "NO", may proceed with cephalosporin use.;    Review of Systems Objective:   Vitals:   05/03/19 0910  BP: (!) 148/78  Pulse: 69  Resp: 16    General: Well developed, nourished, in no acute distress, alert and oriented x3   Dermatological: Skin is warm, dry and supple bilateral. Nails x 10 are well maintained; remaining integument appears unremarkable at this time. There are no open sores, no preulcerative lesions, no rash or signs of infection present.  Vascular: Dorsalis Pedis artery and Posterior Tibial artery pedal pulses  are 2/4 bilateral with immedate capillary fill time. Pedal hair growth present. No varicosities and no lower extremity edema present bilateral.   Neruologic: Grossly intact via light touch bilateral. Vibratory intact via tuning fork bilateral. Protective threshold with Semmes Wienstein monofilament intact to all pedal sites bilateral. Patellar and Achilles deep tendon reflexes 2+ bilateral. No Babinski or clonus noted bilateral.  She may be starting a degenerative neuropathy just in the tips of the toes at this point.  But there is no clinical signs of gout with thermal damage.  Musculoskeletal: No gross boney pedal deformities bilateral. No pain, crepitus, or limitation noted with foot and ankle range of motion bilateral. Muscular strength 5/5 in all groups tested bilateral.  Gait: Unassisted, Nonantalgic.    Radiographs:  None taken  Assessment & Plan:   Assessment: Early neuropathic changes.  Plan: At this point I went ahead and started her on gabapentin 100 mg at nighttime like to follow-up with her in 1 month for reevaluation of the meds.  Should this patient ever complain of redness pain in the feet or gout-like symptoms she is to come by pick up blood work for a CBC and arthritic panel and then have an appointment for follow-up with me.     Janard Culp T. [place], [place]

## 2019-05-07 ENCOUNTER — Other Ambulatory Visit: Payer: Self-pay

## 2019-05-07 ENCOUNTER — Encounter (HOSPITAL_COMMUNITY): Payer: Self-pay | Admitting: Emergency Medicine

## 2019-05-07 ENCOUNTER — Ambulatory Visit (HOSPITAL_COMMUNITY)
Admission: EM | Admit: 2019-05-07 | Discharge: 2019-05-07 | Disposition: A | Discharge status: Home / Self Care | Payer: Medicare Other

## 2019-05-07 DIAGNOSIS — R6883 Chills (without fever): Secondary | ICD-10-CM | POA: Diagnosis not present

## 2019-05-07 NOTE — ED Triage Notes (Signed)
Pt sts just started new meds for gout/neuropathy; pt sts chills x 30 min last night that have now resolved

## 2019-05-07 NOTE — ED Provider Notes (Signed)
[facility]    CSN: [id number] Arrival date & time: 05/07/19  1110      History   Chief Complaint Chief Complaint  Patient presents with  . Chills    HPI Jasmin [name] is a 85 y.o. female.   Pt reports she had chills for 30 minutes last night.  Pt reports no fever.  She does not feel sick.  Pt reports Dr. Scherrie November started her on gabpentin and she is concerned this caused chills.  Pt was started on gabapentin for pain and gout in her toes.       Past Medical History:  Diagnosis Date  . Contact dermatitis and other eczema, due to unspecified cause   . Diverticulosis of colon   . Esophageal stricture   . GERD with stricture    Dr Henrene Pastor  . Hemorrhoids   . Hiatal hernia   . Hyperlipidemia   . Hypertension   . Onychomycosis   . Personal history of urinary calculi   . Polycystic kidney   . Tubulovillous adenoma polyp of colon   . Unspecified vitamin D deficiency   . Vitamin B12 deficiency 2009   Borderline    Patient Active Problem List   Diagnosis Date Noted  . Bilateral primary osteoarthritis of knee 09/13/2018  . Constipation 07/06/2018  . Peripheral neuropathic pain 12/28/2017  . Gout 08/30/2017  . History of colonic polyps 08/13/2016  . Lipoma of arm 09/30/2014  . WBC decreased 05/22/2013  . Low back pain 11/16/2012  . Obesity 05/31/2012  . Dyspnea 03/10/2012  . Edema 03/10/2012  . Memory problem 11/24/2011  . Internal hemorrhoid 05/20/2011  . Well adult exam 11/17/2010  . ONYCHOMYCOSIS 06/10/2009  . DYSPHAGIA 09/10/2008  . B12 deficiency 04/15/2008  . ESOPHAGEAL STRICTURE 12/05/2007  . Hiatal hernia 12/05/2007  . FRACTURE, RIGHT LEG 12/05/2007  . NEPHROLITHIASIS, HX OF 12/05/2007  . PARESTHESIA 09/22/2007  . GERD 08/09/2007  . Vitamin D deficiency 03/20/2007  . Dyslipidemia 03/20/2007  . Essential hypertension 03/20/2007  . DIVERTICULOSIS, COLON 03/20/2007    Past Surgical History:  Procedure Laterality Date  . CATARACT  EXTRACTION Bilateral 2017  . DILATION AND CURETTAGE OF UTERUS  1977  . TOE SURGERY  2012   Right foot   . TONSILLECTOMY     as child    OB History   No obstetric history on file.      Home Medications    Prior to Admission medications   Medication Sig Start Date End Date Taking? Authorizing Provider  allopurinol (ZYLOPRIM) 100 MG tablet Take 0.5 tablets (50 mg total) by mouth daily. 03/08/19   Plotnikov, Evie Lacks, MD  Cholecalciferol 1000 units tablet Take 2 tablets (2,000 Units total) by mouth daily. 10/06/15   Plotnikov, Evie Lacks, MD  colchicine 0.6 MG tablet Take two tablets prn gout attack.Then take another one in 1-2 hrs. Do not repeat for 3 days. 03/08/19   Plotnikov, Evie Lacks, MD  diclofenac sodium (VOLTAREN) 1 % GEL Apply 1 application topically 4 (four) times daily. 03/08/19   Plotnikov, Evie Lacks, MD  famotidine (PEPCID) 40 MG tablet  04/27/19   [provider]  gabapentin (NEURONTIN) 100 MG capsule Take 1 capsule (100 mg total) by mouth at bedtime. 05/03/19   Hyatt, Max T, DPM  hydrocortisone (ANUSOL-HC) 25 MG suppository Place 1 suppository (25 mg total) rectally at bedtime as needed for hemorrhoids or anal itching. 12/23/18   Mauri Pole, MD  lovastatin (MEVACOR) 40 MG tablet TAKE  1 TABLET BY MOUTH AT  BEDTIME 04/05/19   Plotnikov, Evie Lacks, MD  metoprolol tartrate (LOPRESSOR) 50 MG tablet TAKE 1 TABLET BY MOUTH  TWICE DAILY 04/05/19   Plotnikov, Evie Lacks, MD  polyethylene glycol powder (GLYCOLAX/MIRALAX) powder Take 17 g by mouth 2 (two) times daily as needed for mild constipation, moderate constipation or severe constipation. Patient taking differently: Take 17 g by mouth daily as needed for mild constipation, moderate constipation or severe constipation.  07/06/18   Plotnikov, Evie Lacks, MD  spironolactone (ALDACTONE) 50 MG tablet Take 1 tablet (50 mg total) by mouth daily. 10/10/18 10/10/19  Plotnikov, Evie Lacks, MD    Family History Family History  Problem  Relation Age of Onset  . Hypertension Father   . Colon cancer Neg Hx   . Esophageal cancer Neg Hx   . Rectal cancer Neg Hx   . Stomach cancer Neg Hx     Social History Social History   Tobacco Use  . Smoking status: Never Smoker  . Smokeless tobacco: Never Used  Substance Use Topics  . Alcohol use: No    Alcohol/week: 0.0 standard drinks  . Drug use: No     Allergies   Doxycycline, Hctz [hydrochlorothiazide], Omeprazole, Sulfonamide derivatives, Clarithromycin, and Penicillins   Review of Systems Review of Systems  All other systems reviewed and are negative.    Physical Exam Triage Vital Signs ED Triage Vitals  Enc Vitals Group     BP 05/07/19 1148 (!) 142/85     Pulse Rate 05/07/19 1148 78     Resp 05/07/19 1148 16     Temp 05/07/19 1148 98.1 F (36.7 C)     Temp Source 05/07/19 1148 Temporal     SpO2 05/07/19 1148 100 %     Weight --      Height --      Head Circumference --      Peak Flow --      Pain Score 05/07/19 1154 0     Pain Loc --      Pain Edu? --      Excl. in [place]? --    No data found.  Updated Vital Signs BP (!) 142/85 (BP Location: Right Arm)   Pulse 78   Temp 98.1 F (36.7 C) (Temporal)   Resp 16   SpO2 100%   Visual Acuity Right Eye Distance:   Left Eye Distance:   Bilateral Distance:    Right Eye Near:   Left Eye Near:    Bilateral Near:     Physical Exam Vitals signs and nursing note reviewed.  Constitutional:      Appearance: She is well-developed.  HENT:     Head: Normocephalic.     Right Ear: Tympanic membrane normal.     Left Ear: Tympanic membrane normal.     Nose: Nose normal.     Mouth/Throat:     Mouth: Mucous membranes are moist.  Neck:     Musculoskeletal: Normal range of motion.  Cardiovascular:     Rate and Rhythm: Normal rate.  Pulmonary:     Effort: Pulmonary effort is normal.  Abdominal:     General: Abdomen is flat. There is no distension.  Musculoskeletal:        General: Swelling and  tenderness present.  Skin:    General: Skin is warm.  Neurological:     General: No focal deficit present.     Mental Status: She is alert and oriented to person,  place, and time.  Psychiatric:        Mood and Affect: Mood normal.      UC Treatments / Results  Labs (all labs ordered are listed, but only abnormal results are displayed) Labs Reviewed - No data to display  EKG   Radiology No results found.  Procedures Procedures (including critical care time)  Medications Ordered in UC Medications - No data to display  Initial Impression / Assessment and Plan / UC Course  I have reviewed the triage vital signs and the nursing notes.  Pertinent labs & imaging results that were available during my care of the patient were reviewed by me and considered in my medical decision making (see chart for details).     MDM  Pt looks well  No signs or symptoms of illness. I don't think gabapentin caused her chills  Pt is advised to purchase a thermometer and monitor her temperature.   Final Clinical Impressions(s) / UC Diagnoses   Final diagnoses:  Chills     Discharge Instructions     Return if any problems . Monitor your temperature at home.  Return if any fever or sign of illness    ED Prescriptions    None     Controlled Substance Prescriptions [place] Controlled Substance Registry consulted? Not Applicable  An After Visit Summary was printed and given to the patient.    Fransico Meadow, [place] 05/07/19 1546

## 2019-05-07 NOTE — Discharge Instructions (Signed)
Return if any problems . Monitor your temperature at home.  Return if any fever or sign of illness

## 2019-05-31 ENCOUNTER — Encounter: Payer: Self-pay | Admitting: Podiatry

## 2019-05-31 ENCOUNTER — Other Ambulatory Visit: Payer: Self-pay

## 2019-05-31 ENCOUNTER — Ambulatory Visit: Payer: Medicare Other | Admitting: Podiatry

## 2019-05-31 DIAGNOSIS — G5793 Unspecified mononeuropathy of bilateral lower limbs: Secondary | ICD-10-CM

## 2019-05-31 MED ORDER — GABAPENTIN 100 MG PO CAPS: 100.0000 mg | ORAL_CAPSULE | Freq: Every day | ORAL | 3 refills | Status: DC

## 2019-05-31 NOTE — Progress Notes (Signed)
She presents today for follow-up of her neuropathy bilaterally she states that the gabapentin is helping 100%.  Objective: Vital signs are stable she is alert and oriented x3 there is no erythema edema cellulitis drainage or odor no change in physical exam.  Assessment: Resolving neuropathy with long-term use of gabapentin.  Plan: Continue gabapentin 100 mg nightly follow-up with me in 6 months

## 2019-06-07 ENCOUNTER — Other Ambulatory Visit (INDEPENDENT_AMBULATORY_CARE_PROVIDER_SITE_OTHER): Payer: Medicare Other

## 2019-06-07 ENCOUNTER — Other Ambulatory Visit: Payer: Self-pay

## 2019-06-07 ENCOUNTER — Ambulatory Visit (INDEPENDENT_AMBULATORY_CARE_PROVIDER_SITE_OTHER): Payer: Medicare Other | Admitting: Internal Medicine

## 2019-06-07 ENCOUNTER — Encounter: Payer: Self-pay | Admitting: Internal Medicine

## 2019-06-07 DIAGNOSIS — M1000 Idiopathic gout, unspecified site: Secondary | ICD-10-CM | POA: Diagnosis not present

## 2019-06-07 DIAGNOSIS — I10 Essential (primary) hypertension: Secondary | ICD-10-CM

## 2019-06-07 DIAGNOSIS — R1031 Right lower quadrant pain: Secondary | ICD-10-CM | POA: Insufficient documentation

## 2019-06-07 DIAGNOSIS — R109 Unspecified abdominal pain: Secondary | ICD-10-CM

## 2019-06-07 DIAGNOSIS — E538 Deficiency of other specified B group vitamins: Secondary | ICD-10-CM | POA: Diagnosis not present

## 2019-06-07 LAB — URINALYSIS, ROUTINE W REFLEX MICROSCOPIC
Bilirubin Urine: NEGATIVE
Hgb urine dipstick: NEGATIVE
Ketones, ur: NEGATIVE
Nitrite: POSITIVE — AB
Specific Gravity, Urine: 1.015 (ref 1.000–1.030)
Total Protein, Urine: NEGATIVE
Urine Glucose: NEGATIVE
Urobilinogen, UA: 0.2 (ref 0.0–1.0)
pH: 6 (ref 5.0–8.0)

## 2019-06-07 MED ORDER — CIPROFLOXACIN HCL 250 MG PO TABS: 250.0000 mg | ORAL_TABLET | Freq: Two times a day (BID) | ORAL | 1 refills | Status: DC

## 2019-06-07 NOTE — Addendum Note (Signed)
Addended by: Karren Cobble on: 06/07/2019 04:10 PM   Modules accepted: Orders

## 2019-06-07 NOTE — Progress Notes (Signed)
Subjective:  Patient ID: Jasmin [name], female    DOB: 1933/12/04  Age: 85 y.o. MRN: [id number]  CC: No chief complaint on file.   HPI Jasmin [name] presents for 3 mo f/u for HTN, gout, GERD C/o R flank pain started last Fri night, could not sleep, frequency. Bad odor. No fever, blood H/o kidney stone - very remote  Outpatient Medications Prior to Visit  Medication Sig Dispense Refill  . allopurinol (ZYLOPRIM) 100 MG tablet Take 0.5 tablets (50 mg total) by mouth daily. 45 tablet 3  . Cholecalciferol 1000 units tablet Take 2 tablets (2,000 Units total) by mouth daily. 100 tablet 5  . colchicine 0.6 MG tablet Take two tablets prn gout attack.Then take another one in 1-2 hrs. Do not repeat for 3 days. 18 tablet 1  . diclofenac sodium (VOLTAREN) 1 % GEL Apply 1 application topically 4 (four) times daily. 100 g 3  . famotidine (PEPCID) 40 MG tablet     . gabapentin (NEURONTIN) 100 MG capsule Take 1 capsule (100 mg total) by mouth at bedtime. 90 capsule 3  . hydrocortisone (ANUSOL-HC) 25 MG suppository Place 1 suppository (25 mg total) rectally at bedtime as needed for hemorrhoids or anal itching. 30 suppository 0  . lovastatin (MEVACOR) 40 MG tablet TAKE 1 TABLET BY MOUTH AT  BEDTIME 90 tablet 2  . metoprolol tartrate (LOPRESSOR) 50 MG tablet TAKE 1 TABLET BY MOUTH  TWICE DAILY 180 tablet 2  . polyethylene glycol powder (GLYCOLAX/MIRALAX) powder Take 17 g by mouth 2 (two) times daily as needed for mild constipation, moderate constipation or severe constipation. (Patient taking differently: Take 17 g by mouth daily as needed for mild constipation, moderate constipation or severe constipation. ) 500 g 3  . spironolactone (ALDACTONE) 50 MG tablet Take 1 tablet (50 mg total) by mouth daily. 90 tablet 3   No facility-administered medications prior to visit.     ROS: Review of Systems  Constitutional: Negative for activity change, appetite change, chills, fatigue and unexpected  weight change.  HENT: Negative for congestion, mouth sores and sinus pressure.   Eyes: Negative for visual disturbance.  Respiratory: Negative for cough and chest tightness.   Gastrointestinal: Negative for abdominal pain and nausea.  Genitourinary: Positive for frequency. Negative for difficulty urinating and vaginal pain.  Musculoskeletal: Positive for back pain. Negative for gait problem.  Skin: Negative for pallor and rash.  Neurological: Negative for dizziness, tremors, weakness, numbness and headaches.  Psychiatric/Behavioral: Negative for confusion and sleep disturbance.    Objective:  BP 132/74 (BP Location: Left Arm, Patient Position: Sitting, Cuff Size: Large)   Pulse (!) 56   Temp 98.4 F (36.9 C) (Oral)   Ht 5\' 6"  (1.676 m)   Wt 221 lb (100.2 kg)   SpO2 97%   BMI 35.67 kg/m   BP Readings from Last 3 Encounters:  06/07/19 132/74  05/07/19 (!) 142/85  05/03/19 (!) 148/78    Wt Readings from Last 3 Encounters:  06/07/19 221 lb (100.2 kg)  03/08/19 219 lb (99.3 kg)  03/08/19 219 lb (99.3 kg)    Physical Exam Constitutional:      General: She is not in acute distress.    Appearance: She is well-developed.  HENT:     Head: Normocephalic.     Right Ear: External ear normal.     Left Ear: External ear normal.     Nose: Nose normal.  Eyes:     General:  Right eye: No discharge.        Left eye: No discharge.     Conjunctiva/sclera: Conjunctivae normal.     Pupils: Pupils are equal, round, and reactive to light.  Neck:     Musculoskeletal: Normal range of motion and neck supple.     Thyroid: No thyromegaly.     Vascular: No JVD.     Trachea: No tracheal deviation.  Cardiovascular:     Rate and Rhythm: Normal rate and regular rhythm.     Heart sounds: Normal heart sounds.  Pulmonary:     Effort: No respiratory distress.     Breath sounds: No stridor. No wheezing.  Abdominal:     General: Bowel sounds are normal. There is no distension.      Palpations: Abdomen is soft. There is no mass.     Tenderness: There is no abdominal tenderness. There is no guarding or rebound.  Musculoskeletal:        General: No tenderness.  Lymphadenopathy:     Cervical: No cervical adenopathy.  Skin:    Findings: No erythema or rash.  Neurological:     Cranial Nerves: No cranial nerve deficit.     Motor: No abnormal muscle tone.     Coordination: Coordination normal.     Deep Tendon Reflexes: Reflexes normal.  Psychiatric:        Behavior: Behavior normal.        Thought Content: Thought content normal.        Judgment: Judgment normal.   LS NT  Lab Results  Component Value Date   WBC 3.0 (L) 04/09/2019   HGB 14.0 04/09/2019   HCT 41.7 04/09/2019   PLT 93.0 (L) 04/09/2019   GLUCOSE 104 (H) 04/09/2019   CHOL 170 02/16/2017   TRIG 126.0 02/16/2017   HDL 55.80 02/16/2017   LDLDIRECT 139.3 05/31/2012   LDLCALC 89 02/16/2017   ALT 11 04/09/2019   AST 14 04/09/2019   NA 137 04/09/2019   K 3.9 04/09/2019   CL 102 04/09/2019   CREATININE 1.26 (H) 04/09/2019   BUN 19 04/09/2019   CO2 27 04/09/2019   TSH 1.02 02/16/2017   INR 1.16 08/16/2009    No results found.  Assessment & Plan:   There are no diagnoses linked to this encounter.   No orders of the defined types were placed in this encounter.    Follow-up: No follow-ups on file.  Jasmin Kehr, MD

## 2019-06-07 NOTE — Patient Instructions (Signed)
If you have medicare related insurance (such as traditional Medicare, Blue Cross Medicare, United HealthCare Medicare, or similar), Please make an appointment at the scheduling desk with Jill, the Wellness Health Coach, for your Wellness visit in this office, which is a benefit with your insurance.  

## 2019-06-07 NOTE — Assessment & Plan Note (Signed)
No relapse Cont Allopurinol

## 2019-06-07 NOTE — Assessment & Plan Note (Signed)
BP Readings from Last 3 Encounters:  06/07/19 132/74  05/07/19 (!) 142/85  05/03/19 (!) 148/78

## 2019-06-07 NOTE — Assessment & Plan Note (Signed)
On B complex 

## 2019-06-07 NOTE — Assessment & Plan Note (Addendum)
L acute -- ?UTI vs stone vs other UA, renal US Empiric Cipro

## 2019-06-15 ENCOUNTER — Other Ambulatory Visit: Payer: Self-pay | Admitting: Internal Medicine

## 2019-06-25 ENCOUNTER — Encounter: Payer: Self-pay | Admitting: Internal Medicine

## 2019-06-27 ENCOUNTER — Telehealth: Payer: Self-pay | Admitting: Internal Medicine

## 2019-06-27 MED ORDER — ALLOPURINOL 100 MG PO TABS: 50.0000 mg | ORAL_TABLET | Freq: Every day | ORAL | 3 refills | Status: DC

## 2019-06-27 NOTE — Telephone Encounter (Signed)
RX sent, unable to reach pt

## 2019-06-27 NOTE — Telephone Encounter (Signed)
Pt called back and aware Rx sent to local pharmacy

## 2019-06-27 NOTE — Telephone Encounter (Signed)
Copied from [place] [id number]. Topic: General - Other >> Jun 27, 2019 11:08 AM Keene Breath wrote: Reason for CRM: Patient called to inform the nurse and doctor that her pharmacy, [facility] is on back order for her medication, allopurinol (ZYLOPRIM) 100 MG tablet.  She would like it sent to her local pharmacy at [place], [place] - [place] [phone] (Phone) [phone] (Fax).  Please advise and call patient once this has been approved.  CB# [phone]

## 2019-07-03 ENCOUNTER — Ambulatory Visit
Admission: RE | Admit: 2019-07-03 | Discharge: 2019-07-03 | Disposition: A | Discharge status: Home / Self Care | Payer: Medicare Other | Source: Ambulatory Visit | Attending: Internal Medicine | Admitting: Internal Medicine

## 2019-07-03 DIAGNOSIS — R109 Unspecified abdominal pain: Secondary | ICD-10-CM

## 2019-07-03 DIAGNOSIS — N281 Cyst of kidney, acquired: Secondary | ICD-10-CM | POA: Diagnosis not present

## 2019-08-15 ENCOUNTER — Other Ambulatory Visit: Payer: Self-pay | Admitting: Internal Medicine

## 2019-08-29 ENCOUNTER — Other Ambulatory Visit: Payer: Self-pay

## 2019-08-29 DIAGNOSIS — Z20822 Contact with and (suspected) exposure to covid-19: Secondary | ICD-10-CM | POA: Diagnosis not present

## 2019-08-30 LAB — NOVEL CORONAVIRUS, NAA: SARS-CoV-2, NAA: NOT DETECTED

## 2019-09-05 ENCOUNTER — Ambulatory Visit: Payer: Medicare Other | Admitting: Internal Medicine

## 2019-10-24 ENCOUNTER — Encounter: Payer: Self-pay | Admitting: Internal Medicine

## 2019-10-24 ENCOUNTER — Ambulatory Visit (INDEPENDENT_AMBULATORY_CARE_PROVIDER_SITE_OTHER): Payer: Medicare Other | Admitting: Internal Medicine

## 2019-10-24 ENCOUNTER — Other Ambulatory Visit: Payer: Self-pay

## 2019-10-24 DIAGNOSIS — R350 Frequency of micturition: Secondary | ICD-10-CM | POA: Diagnosis not present

## 2019-10-24 DIAGNOSIS — E538 Deficiency of other specified B group vitamins: Secondary | ICD-10-CM | POA: Diagnosis not present

## 2019-10-24 DIAGNOSIS — M545 Low back pain, unspecified: Secondary | ICD-10-CM

## 2019-10-24 DIAGNOSIS — G8929 Other chronic pain: Secondary | ICD-10-CM | POA: Diagnosis not present

## 2019-10-24 DIAGNOSIS — E559 Vitamin D deficiency, unspecified: Secondary | ICD-10-CM

## 2019-10-24 DIAGNOSIS — M1000 Idiopathic gout, unspecified site: Secondary | ICD-10-CM | POA: Diagnosis not present

## 2019-10-24 LAB — URINALYSIS, ROUTINE W REFLEX MICROSCOPIC
Bilirubin Urine: NEGATIVE
Hgb urine dipstick: NEGATIVE
Ketones, ur: NEGATIVE
Leukocytes,Ua: NEGATIVE
Nitrite: POSITIVE — AB
RBC / HPF: NONE SEEN (ref 0–?)
Specific Gravity, Urine: 1.02 (ref 1.000–1.030)
Total Protein, Urine: NEGATIVE
Urine Glucose: NEGATIVE
Urobilinogen, UA: 0.2 (ref 0.0–1.0)
pH: 6 (ref 5.0–8.0)

## 2019-10-24 NOTE — Assessment & Plan Note (Signed)
Allopurinol 50 mg/d - d/c due to frequent urination

## 2019-10-24 NOTE — Progress Notes (Signed)
Subjective:  Patient ID: Jasmin [name], female    DOB: 14-Jun-1934  Age: 86 y.o. MRN: [id number]  CC: No chief complaint on file.   HPI Jasmin [name] presents for HTN, gout, GERD f/u Pt stopped Allopurinol due to frequency  Outpatient Medications Prior to Visit  Medication Sig Dispense Refill  . Cholecalciferol 1000 units tablet Take 2 tablets (2,000 Units total) by mouth daily. 100 tablet 5  . ciprofloxacin (CIPRO) 250 MG tablet Take 1 tablet (250 mg total) by mouth 2 (two) times daily. 6 tablet 1  . colchicine 0.6 MG tablet Take two tablets prn gout attack.Then take another one in 1-2 hrs. Do not repeat for 3 days. 18 tablet 1  . diclofenac sodium (VOLTAREN) 1 % GEL Apply 1 application topically 4 (four) times daily. 100 g 3  . famotidine (PEPCID) 40 MG tablet TAKE 1 TABLET BY MOUTH  DAILY 90 tablet 3  . gabapentin (NEURONTIN) 100 MG capsule Take 1 capsule (100 mg total) by mouth at bedtime. 90 capsule 3  . hydrocortisone (ANUSOL-HC) 25 MG suppository Place 1 suppository (25 mg total) rectally at bedtime as needed for hemorrhoids or anal itching. 30 suppository 0  . lovastatin (MEVACOR) 40 MG tablet TAKE 1 TABLET BY MOUTH AT  BEDTIME 90 tablet 2  . metoprolol tartrate (LOPRESSOR) 50 MG tablet TAKE 1 TABLET BY MOUTH  TWICE DAILY 180 tablet 2  . polyethylene glycol powder (GLYCOLAX/MIRALAX) powder Take 17 g by mouth 2 (two) times daily as needed for mild constipation, moderate constipation or severe constipation. (Patient taking differently: Take 17 g by mouth daily as needed for mild constipation, moderate constipation or severe constipation. ) 500 g 3  . spironolactone (ALDACTONE) 50 MG tablet Take 1 tablet (50 mg total) by mouth daily. Annual appt due in Febuary must see provider for future refills 90 tablet 0  . allopurinol (ZYLOPRIM) 100 MG tablet Take 0.5 tablets (50 mg total) by mouth daily. (Patient not taking: Reported on 10/24/2019) 45 tablet 3   No facility-administered  medications prior to visit.    ROS: Review of Systems  Constitutional: Positive for unexpected weight change. Negative for activity change, appetite change, chills and fatigue.  HENT: Negative for congestion, mouth sores and sinus pressure.   Eyes: Negative for visual disturbance.  Respiratory: Negative for cough and chest tightness.   Gastrointestinal: Negative for abdominal pain and nausea.  Genitourinary: Negative for difficulty urinating, frequency and vaginal pain.  Musculoskeletal: Positive for arthralgias. Negative for back pain and gait problem.  Skin: Negative for pallor and rash.  Neurological: Negative for dizziness, tremors, weakness, numbness and headaches.  Psychiatric/Behavioral: Negative for confusion and sleep disturbance.    Objective:  BP 126/74 (BP Location: Left Arm, Patient Position: Sitting, Cuff Size: Normal)   Pulse 64   Temp 98.3 F (36.8 C) (Oral)   Ht 5\' 6"  (1.676 m)   Wt 224 lb (101.6 kg)   SpO2 97%   BMI 36.15 kg/m   BP Readings from Last 3 Encounters:  10/24/19 126/74  06/07/19 132/74  05/07/19 (!) 142/85    Wt Readings from Last 3 Encounters:  10/24/19 224 lb (101.6 kg)  06/07/19 221 lb (100.2 kg)  03/08/19 219 lb (99.3 kg)    Physical Exam Constitutional:      General: She is not in acute distress.    Appearance: She is well-developed.  HENT:     Head: Normocephalic.     Right Ear: External ear normal.  Left Ear: External ear normal.     Nose: Nose normal.  Eyes:     General:        Right eye: No discharge.        Left eye: No discharge.     Conjunctiva/sclera: Conjunctivae normal.     Pupils: Pupils are equal, round, and reactive to light.  Neck:     Thyroid: No thyromegaly.     Vascular: No JVD.     Trachea: No tracheal deviation.  Cardiovascular:     Rate and Rhythm: Normal rate and regular rhythm.     Heart sounds: Normal heart sounds.  Pulmonary:     Effort: No respiratory distress.     Breath sounds: No stridor.  No wheezing.  Abdominal:     General: Bowel sounds are normal. There is no distension.     Palpations: Abdomen is soft. There is no mass.     Tenderness: There is no abdominal tenderness. There is no guarding or rebound.  Musculoskeletal:        General: No tenderness.     Cervical back: Normal range of motion and neck supple.  Lymphadenopathy:     Cervical: No cervical adenopathy.  Skin:    Findings: No erythema or rash.  Neurological:     Cranial Nerves: No cranial nerve deficit.     Motor: No abnormal muscle tone.     Coordination: Coordination normal.     Deep Tendon Reflexes: Reflexes normal.  Psychiatric:        Behavior: Behavior normal.        Thought Content: Thought content normal.        Judgment: Judgment normal.     Lab Results  Component Value Date   WBC 3.0 (L) 04/09/2019   HGB 14.0 04/09/2019   HCT 41.7 04/09/2019   PLT 93.0 (L) 04/09/2019   GLUCOSE 104 (H) 04/09/2019   CHOL 170 02/16/2017   TRIG 126.0 02/16/2017   HDL 55.80 02/16/2017   LDLDIRECT 139.3 05/31/2012   LDLCALC 89 02/16/2017   ALT 11 04/09/2019   AST 14 04/09/2019   NA 137 04/09/2019   K 3.9 04/09/2019   CL 102 04/09/2019   CREATININE 1.26 (H) 04/09/2019   BUN 19 04/09/2019   CO2 27 04/09/2019   TSH 1.02 02/16/2017   INR 1.16 08/16/2009    US Renal  Result Date: 07/03/2019 CLINICAL DATA:  RIGHT flank pain EXAM: RENAL / URINARY TRACT ULTRASOUND COMPLETE COMPARISON:  Ultrasound abdomen 12/11/2010 FINDINGS: Right Kidney: Renal measurements: 10.2 x 4.9 x 6.0 cm = volume: 155 mL. Normal cortical echogenicity. Mild cortical thinning. Small peripelvic cyst 2.2 x 1.3 x 2.4 cm. Mild caliectasis. No additional mass or pelviectasis. No shadowing calcification. Left Kidney: Renal measurements: 10.5 x 5.0 x 7.1 cm = volume: 197 mL. Cortical thinning. Normal cortical echogenicity. Small exophytic cyst at mid inferior pole 2.1 x 1.8 x 1.9 cm. Additional peripelvic cyst 3.0 x 2.0 x 2.5 cm. No additional  mass, hydronephrosis, or shadowing calcification Bladder: Appears normal for degree of bladder distention. Other: None. IMPRESSION: BILATERAL peripelvic renal cysts. Small additional exophytic LEFT renal cyst. Mild caliectasis of RIGHT kidney without renal pelvic dilatation. Electronically Signed   By: Lavonia Dana M.D.   On: 07/03/2019 16:25    Assessment & Plan:   There are no diagnoses linked to this encounter.   No orders of the defined types were placed in this encounter.    Follow-up: No follow-ups on file.  Walker Kehr, MD

## 2019-10-24 NOTE — Assessment & Plan Note (Signed)
B complex

## 2019-10-24 NOTE — Assessment & Plan Note (Signed)
Vit D 

## 2019-10-24 NOTE — Assessment & Plan Note (Signed)
Back brace prn MRI LS spine if not bettter

## 2019-10-26 LAB — CULTURE, URINE COMPREHENSIVE

## 2019-10-30 ENCOUNTER — Other Ambulatory Visit: Payer: Self-pay | Admitting: Internal Medicine

## 2019-10-30 MED ORDER — CIPROFLOXACIN HCL 250 MG PO TABS: 250.0000 mg | ORAL_TABLET | Freq: Two times a day (BID) | ORAL | 0 refills | Status: DC

## 2019-11-29 ENCOUNTER — Ambulatory Visit: Payer: Medicare Other | Admitting: Podiatry

## 2019-12-17 DIAGNOSIS — H02831 Dermatochalasis of right upper eyelid: Secondary | ICD-10-CM | POA: Diagnosis not present

## 2019-12-17 DIAGNOSIS — Z961 Presence of intraocular lens: Secondary | ICD-10-CM | POA: Diagnosis not present

## 2019-12-17 DIAGNOSIS — H04123 Dry eye syndrome of bilateral lacrimal glands: Secondary | ICD-10-CM | POA: Diagnosis not present

## 2019-12-17 DIAGNOSIS — H02834 Dermatochalasis of left upper eyelid: Secondary | ICD-10-CM | POA: Diagnosis not present

## 2019-12-22 ENCOUNTER — Ambulatory Visit (HOSPITAL_COMMUNITY)
Admission: EM | Admit: 2019-12-22 | Discharge: 2019-12-22 | Disposition: A | Discharge status: Home / Self Care | Payer: Medicare Other | Attending: Family Medicine | Admitting: Family Medicine

## 2019-12-22 ENCOUNTER — Other Ambulatory Visit: Payer: Self-pay

## 2019-12-22 ENCOUNTER — Encounter (HOSPITAL_COMMUNITY): Payer: Self-pay | Admitting: *Deleted

## 2019-12-22 DIAGNOSIS — R351 Nocturia: Secondary | ICD-10-CM | POA: Diagnosis present

## 2019-12-22 DIAGNOSIS — R350 Frequency of micturition: Secondary | ICD-10-CM | POA: Insufficient documentation

## 2019-12-22 HISTORY — DX: Gout, unspecified: M10.9

## 2019-12-22 LAB — COMPREHENSIVE METABOLIC PANEL
ALT: 14 U/L (ref 0–44)
AST: 20 U/L (ref 15–41)
Albumin: 4.2 g/dL (ref 3.5–5.0)
Alkaline Phosphatase: 50 U/L (ref 38–126)
Anion gap: 9 (ref 5–15)
BUN: 18 mg/dL (ref 8–23)
CO2: 26 mmol/L (ref 22–32)
Calcium: 10.3 mg/dL (ref 8.9–10.3)
Chloride: 107 mmol/L (ref 98–111)
Creatinine, Ser: 1.21 mg/dL — ABNORMAL HIGH (ref 0.44–1.00)
GFR calc Af Amer: 48 mL/min — ABNORMAL LOW (ref >60)
GFR calc non Af Amer: 41 mL/min — ABNORMAL LOW (ref >60)
Glucose, Bld: 101 mg/dL — ABNORMAL HIGH (ref 70–99)
Potassium: 4.4 mmol/L (ref 3.5–5.1)
Sodium: 142 mmol/L (ref 135–145)
Total Bilirubin: 0.8 mg/dL (ref 0.3–1.2)
Total Protein: 7 g/dL (ref 6.5–8.1)

## 2019-12-22 LAB — POCT URINALYSIS DIP (DEVICE)
Bilirubin Urine: NEGATIVE
Glucose, UA: NEGATIVE mg/dL
Hgb urine dipstick: NEGATIVE
Ketones, ur: NEGATIVE mg/dL
Nitrite: NEGATIVE
Protein, ur: NEGATIVE mg/dL
Specific Gravity, Urine: 1.01 (ref 1.005–1.030)
Urobilinogen, UA: 0.2 mg/dL (ref 0.0–1.0)
pH: 6 (ref 5.0–8.0)

## 2019-12-22 NOTE — Discharge Instructions (Addendum)
I will contact you later this afternoon with the results of your lab work.

## 2019-12-22 NOTE — ED Provider Notes (Signed)
[place]    CSN: [id number] Arrival date & time: 12/22/19  1005      History   Chief Complaint Chief Complaint  Patient presents with  . Dysuria    HPI Jasmin [name] is a 86 y.o. female.   HPI Patient presents with a concern for urinary frequency. Patient denies dysuria, lower abdominal pressure, chills, fever, or CV tenderness. Patient endorses lower right sided localized tenderness immediately above the right hip. Patient has a chronic history of low back pain and recurrent flank pain. She endorses urinating on average 2-3 times during the night and grew concern that last night she reports urinating more than 6 times. Patient has spironolactone listed as an active medication, however, patient denies taking medication orin creased intake of water. At baseline, patient suffers from CKD III and has not had repeat renal function studies since that time. She is schedule for a visit with PCP in 6 weeks. Denies chest pain, shortness of breath, or weakness. Past Medical History:  Diagnosis Date  . Contact dermatitis and other eczema, due to unspecified cause   . Diverticulosis of colon   . Esophageal stricture   . GERD with stricture    Dr Henrene Pastor  . Hemorrhoids   . Hiatal hernia   . Hyperlipidemia   . Hypertension   . Onychomycosis   . Personal history of urinary calculi   . Polycystic kidney   . Tubulovillous adenoma polyp of colon   . Unspecified vitamin D deficiency   . Vitamin B12 deficiency 2009   Borderline    Patient Active Problem List   Diagnosis Date Noted  . Flank pain 06/07/2019  . Bilateral primary osteoarthritis of knee 09/13/2018  . Constipation 07/06/2018  . Peripheral neuropathic pain 12/28/2017  . Gout 08/30/2017  . History of colonic polyps 08/13/2016  . Lipoma of arm 09/30/2014  . WBC decreased 05/22/2013  . Low back pain 11/16/2012  . Obesity 05/31/2012  . Dyspnea 03/10/2012  . Edema 03/10/2012  . Memory problem 11/24/2011  .  Internal hemorrhoid 05/20/2011  . Well adult exam 11/17/2010  . ONYCHOMYCOSIS 06/10/2009  . DYSPHAGIA 09/10/2008  . B12 deficiency 04/15/2008  . ESOPHAGEAL STRICTURE 12/05/2007  . Hiatal hernia 12/05/2007  . FRACTURE, RIGHT LEG 12/05/2007  . NEPHROLITHIASIS, HX OF 12/05/2007  . PARESTHESIA 09/22/2007  . GERD 08/09/2007  . Vitamin D deficiency 03/20/2007  . Dyslipidemia 03/20/2007  . Essential hypertension 03/20/2007  . DIVERTICULOSIS, COLON 03/20/2007    Past Surgical History:  Procedure Laterality Date  . CATARACT EXTRACTION Bilateral 2017  . DILATION AND CURETTAGE OF UTERUS  1977  . TOE SURGERY  2012   Right foot   . TONSILLECTOMY     as child    OB History   No obstetric history on file.      Home Medications    Prior to Admission medications   Medication Sig Start Date End Date Taking? Authorizing Provider  Cholecalciferol 1000 units tablet Take 2 tablets (2,000 Units total) by mouth daily. 10/06/15   Plotnikov, Evie Lacks, MD  ciprofloxacin (CIPRO) 250 MG tablet Take 1 tablet (250 mg total) by mouth 2 (two) times daily. 10/30/19   Plotnikov, Evie Lacks, MD  colchicine 0.6 MG tablet Take two tablets prn gout attack.Then take another one in 1-2 hrs. Do not repeat for 3 days. 03/08/19   Plotnikov, Evie Lacks, MD  diclofenac sodium (VOLTAREN) 1 % GEL Apply 1 application topically 4 (four) times daily. 03/08/19   Plotnikov,  Evie Lacks, MD  famotidine (PEPCID) 40 MG tablet TAKE 1 TABLET BY MOUTH  DAILY 06/15/19   Plotnikov, Evie Lacks, MD  gabapentin (NEURONTIN) 100 MG capsule Take 1 capsule (100 mg total) by mouth at bedtime. 05/31/19   Hyatt, Max T, DPM  hydrocortisone (ANUSOL-HC) 25 MG suppository Place 1 suppository (25 mg total) rectally at bedtime as needed for hemorrhoids or anal itching. 12/23/18   Mauri Pole, MD  lovastatin (MEVACOR) 40 MG tablet TAKE 1 TABLET BY MOUTH AT  BEDTIME 10/30/19   Plotnikov, Evie Lacks, MD  metoprolol tartrate (LOPRESSOR) 50 MG tablet TAKE 1  TABLET BY MOUTH  TWICE DAILY 10/30/19   Plotnikov, Evie Lacks, MD  polyethylene glycol powder (GLYCOLAX/MIRALAX) powder Take 17 g by mouth 2 (two) times daily as needed for mild constipation, moderate constipation or severe constipation. Patient taking differently: Take 17 g by mouth daily as needed for mild constipation, moderate constipation or severe constipation.  07/06/18   Plotnikov, Evie Lacks, MD  spironolactone (ALDACTONE) 50 MG tablet Take 1 tablet (50 mg total) by mouth daily. Annual appt due in Febuary must see provider for future refills 08/22/19   Plotnikov, Evie Lacks, MD    Family History Family History  Problem Relation Age of Onset  . Hypertension Father   . Colon cancer Neg Hx   . Esophageal cancer Neg Hx   . Rectal cancer Neg Hx   . Stomach cancer Neg Hx     Social History Social History   Tobacco Use  . Smoking status: Never Smoker  . Smokeless tobacco: Never Used  Substance Use Topics  . Alcohol use: No    Alcohol/week: 0.0 standard drinks  . Drug use: No     Allergies   Allopurinol, Doxycycline, Hctz [hydrochlorothiazide], Omeprazole, Sulfonamide derivatives, Clarithromycin, and Penicillins   Review of Systems Review of Systems Pertinent negatives listed in HPI Physical Exam Triage Vital Signs ED Triage Vitals  Enc Vitals Group     BP      Pulse      Resp      Temp      Temp src      SpO2      Weight      Height      Head Circumference      Peak Flow      Pain Score      Pain Loc      Pain Edu?      Excl. in [place]?    No data found.  Updated Vital Signs BP (!) 166/68   Pulse 61   Temp 98.1 F (36.7 C) (Oral)   Resp 18   SpO2 99%   Visual Acuity Right Eye Distance:   Left Eye Distance:   Bilateral Distance:    Right Eye Near:   Left Eye Near:    Bilateral Near:     Physical Exam Constitutional: Patient appears well-developed and well-nourished. No distress. HENT: Normocephalic, atraumatic, External right and left ear normal..    Eyes: Conjunctivae and EOM are normal. PERRLA, no scleral icterus. CVS: RRR, S1/S2 +, no murmurs, no gallops, no carotid bruit.  Pulmonary: Effort and breath sounds normal, no stridor, rhonchi, wheezes, rales.  Abdominal: Soft. BS +, no distension, tenderness, rebound or guarding. Negative CVA tenderness. Musculoskeletal: No deformities. No edema and no tenderness.  Neuro: Alert. Normal reflexes, muscle tone coordination. No cranial nerve deficit. Skin: Skin is warm and dry. No rash noted. Not diaphoretic. No erythema. No pallor.  Psychiatric: Normal mood and affect. Behavior, judgment, thought content normal.  UC Treatments / Results  Labs (all labs ordered are listed, but only abnormal results are displayed) Labs Reviewed - No data to display  EKG   Radiology No results found.  Procedures Procedures (including critical care time)  Medications Ordered in UC Medications - No data to display  Initial Impression / Assessment and Plan / UC Course  I have reviewed the triage vital signs and the nursing notes.  Pertinent labs & imaging results that were available during my care of the patient were reviewed by me and considered in my medical decision making (see chart for details).      1. Urinary frequency 2. Nocturia Patient has a history of urinary frequency with history of mild renal disease. She endorses increases in urinary frequency unassociated with increased fluid intake.  UA significant for only a trace of leukocytes. Will culture urine. Checking CMP pending to evaluate renal and electrolyte function, last CMP 03/2019.  Final Clinical Impressions(s) / UC Diagnoses   Final diagnoses:  Urinary frequency  Nocturia     Discharge Instructions     I will contact you later this afternoon with the results of your [facility]    ED Prescriptions    None     PDMP not reviewed this encounter.   Scot Jun, FNP 12/24/19 1011

## 2019-12-22 NOTE — ED Triage Notes (Signed)
Reports UTI 10/30/19 - completed course of Cipro with resolution of sxs.  C/O dysuria, polyuria, urinary urgency with hematuria since yesterday without fevers.

## 2019-12-23 LAB — URINE CULTURE
Culture: 50000 — AB
Special Requests: NORMAL

## 2019-12-25 ENCOUNTER — Emergency Department (HOSPITAL_COMMUNITY)
Admission: EM | Admit: 2019-12-25 | Discharge: 2019-12-25 | Disposition: A | Discharge status: Home / Self Care | Payer: Medicare Other | Attending: Emergency Medicine | Admitting: Emergency Medicine

## 2019-12-25 ENCOUNTER — Other Ambulatory Visit: Payer: Self-pay

## 2019-12-25 ENCOUNTER — Encounter (HOSPITAL_COMMUNITY): Payer: Self-pay

## 2019-12-25 ENCOUNTER — Emergency Department (HOSPITAL_COMMUNITY): Payer: Medicare Other

## 2019-12-25 DIAGNOSIS — Z79899 Other long term (current) drug therapy: Secondary | ICD-10-CM | POA: Insufficient documentation

## 2019-12-25 DIAGNOSIS — R1031 Right lower quadrant pain: Secondary | ICD-10-CM | POA: Diagnosis not present

## 2019-12-25 DIAGNOSIS — R350 Frequency of micturition: Secondary | ICD-10-CM | POA: Diagnosis not present

## 2019-12-25 DIAGNOSIS — I10 Essential (primary) hypertension: Secondary | ICD-10-CM | POA: Insufficient documentation

## 2019-12-25 DIAGNOSIS — R109 Unspecified abdominal pain: Secondary | ICD-10-CM | POA: Diagnosis not present

## 2019-12-25 DIAGNOSIS — D3502 Benign neoplasm of left adrenal gland: Secondary | ICD-10-CM | POA: Diagnosis not present

## 2019-12-25 DIAGNOSIS — K5730 Diverticulosis of large intestine without perforation or abscess without bleeding: Secondary | ICD-10-CM | POA: Diagnosis not present

## 2019-12-25 DIAGNOSIS — Q613 Polycystic kidney, unspecified: Secondary | ICD-10-CM | POA: Insufficient documentation

## 2019-12-25 LAB — URINE CULTURE

## 2019-12-25 LAB — URINALYSIS, ROUTINE W REFLEX MICROSCOPIC
Bilirubin Urine: NEGATIVE
Glucose, UA: NEGATIVE mg/dL
Hgb urine dipstick: NEGATIVE
Ketones, ur: NEGATIVE mg/dL
Leukocytes,Ua: NEGATIVE
Nitrite: NEGATIVE
Protein, ur: NEGATIVE mg/dL
Specific Gravity, Urine: 1.011 (ref 1.005–1.030)
pH: 6 (ref 5.0–8.0)

## 2019-12-25 NOTE — Discharge Instructions (Addendum)
Your urine showed no sign of infection or blood today.  CT scan of your abdomen pelvis showed no acute abnormality.  He had small stones in your kidneys but these will not cause pain.  No stone leaving the kidneys.  No sign of kidney infection or urinary tract infection.  Your appendix appeared normal.  Labs at urgent care showed a stable chronic kidney disease without other acute abnormality.  I feel you are safe for discharge and may take Tylenol 1000 mg every 6 hours as needed for discomfort.  I recommend close follow-up with your primary care physician if symptoms return.

## 2019-12-25 NOTE — ED Triage Notes (Signed)
Pt reports that she went to UC several days ago due to R sided flank pain, has urine and blood done and was told she would have a follow up with urology and never heard anything back. Tonight R flank pain is worse and radiates to back. Denies hematuria

## 2019-12-25 NOTE — ED Notes (Signed)
Phone number for husband, Minerva Fester, [phone] given to RN for call for pickup. Pt verbalizes she feels better and is ready to go home.

## 2019-12-25 NOTE — ED Notes (Signed)
Provider at bedside

## 2019-12-25 NOTE — ED Provider Notes (Addendum)
TIME SEEN: 5:10 AM  CHIEF COMPLAINT: Urinary frequency, flank pain  HPI: Patient is an 86 year old female with history of polycystic kidneys, hypertension, hyperlipidemia who presents to the emergency department concerns for right flank pain that has mostly resolved.  States on Saturday she started having urinary frequency and noticed that every time she stood up she had to run to the bathroom to pee.  She had no urinary incontinence.  She did not note any dysuria or hematuria.  She states she has had urinary frequency before because of being on spironolactone.  States she stopped taking this medication and her urinary frequency has resolved.  No lower back pain, numbness, tingling or focal weakness.  No urinary retention.  States today she started having some flank pain and became concerned.  No known history of kidney stones.  No fevers, chills, nausea, vomiting or diarrhea.  No vaginal bleeding or discharge.  Pain has now resolved.  No injury to the back.  ROS: See HPI Constitutional: no fever  Eyes: no drainage  ENT: no runny nose   Cardiovascular:  no chest pain  Resp: no SOB  GI: no vomiting GU: no dysuria; + urinary frequency Integumentary: no rash  Allergy: no hives  Musculoskeletal: no leg swelling  Neurological: no slurred speech ROS otherwise negative  PAST MEDICAL HISTORY/PAST SURGICAL HISTORY:  Past Medical History:  Diagnosis Date  . Contact dermatitis and other eczema, due to unspecified cause   . Diverticulosis of colon   . Esophageal stricture   . GERD with stricture    Dr Henrene Pastor  . Gout   . Hemorrhoids   . Hiatal hernia   . Hyperlipidemia   . Hypertension   . Onychomycosis   . Personal history of urinary calculi   . Polycystic kidney   . Tubulovillous adenoma polyp of colon   . Unspecified vitamin D deficiency   . Vitamin B12 deficiency 2009   Borderline    MEDICATIONS:  Prior to Admission medications   Medication Sig Start Date End Date Taking?  Authorizing Provider  Cholecalciferol 1000 units tablet Take 2 tablets (2,000 Units total) by mouth daily. 10/06/15   Plotnikov, Evie Lacks, MD  ciprofloxacin (CIPRO) 250 MG tablet Take 1 tablet (250 mg total) by mouth 2 (two) times daily. 10/30/19   Plotnikov, Evie Lacks, MD  colchicine 0.6 MG tablet Take two tablets prn gout attack.Then take another one in 1-2 hrs. Do not repeat for 3 days. 03/08/19   Plotnikov, Evie Lacks, MD  diclofenac sodium (VOLTAREN) 1 % GEL Apply 1 application topically 4 (four) times daily. 03/08/19   Plotnikov, Evie Lacks, MD  famotidine (PEPCID) 40 MG tablet TAKE 1 TABLET BY MOUTH  DAILY 06/15/19   Plotnikov, Evie Lacks, MD  gabapentin (NEURONTIN) 100 MG capsule Take 1 capsule (100 mg total) by mouth at bedtime. 05/31/19   Hyatt, Max T, DPM  hydrocortisone (ANUSOL-HC) 25 MG suppository Place 1 suppository (25 mg total) rectally at bedtime as needed for hemorrhoids or anal itching. 12/23/18   Mauri Pole, MD  lovastatin (MEVACOR) 40 MG tablet TAKE 1 TABLET BY MOUTH AT  BEDTIME 10/30/19   Plotnikov, Evie Lacks, MD  metoprolol tartrate (LOPRESSOR) 50 MG tablet TAKE 1 TABLET BY MOUTH  TWICE DAILY 10/30/19   Plotnikov, Evie Lacks, MD  polyethylene glycol powder (GLYCOLAX/MIRALAX) powder Take 17 g by mouth 2 (two) times daily as needed for mild constipation, moderate constipation or severe constipation. Patient taking differently: Take 17 g by mouth daily as  needed for mild constipation, moderate constipation or severe constipation.  07/06/18   Plotnikov, Evie Lacks, MD  spironolactone (ALDACTONE) 50 MG tablet Take 1 tablet (50 mg total) by mouth daily. Annual appt due in Febuary must see provider for future refills 08/22/19   Plotnikov, Evie Lacks, MD    ALLERGIES:  Allergies  Allergen Reactions  . Allopurinol     Frequent urination  . Doxycycline     nausea  . Hctz [Hydrochlorothiazide]     gout  . Omeprazole     diarrhea  . Sulfonamide Derivatives     Per pt unknown  .  Clarithromycin Rash  . Penicillins Rash    Did it involve swelling of the face/tongue/throat, SOB, or low BP? Yes Did it involve sudden or severe rash/hives, skin peeling, or any reaction on the inside of your mouth or nose? No Did you need to seek medical attention at a hospital or doctor's office? No When did it last happen?childhood If all above answers are "NO", may proceed with cephalosporin use.;     SOCIAL HISTORY:  Social History   Tobacco Use  . Smoking status: Never Smoker  . Smokeless tobacco: Never Used  Substance Use Topics  . Alcohol use: No    Alcohol/week: 0.0 standard drinks    FAMILY HISTORY: Family History  Problem Relation Age of Onset  . Hypertension Father   . Colon cancer Neg Hx   . Esophageal cancer Neg Hx   . Rectal cancer Neg Hx   . Stomach cancer Neg Hx     EXAM: BP (!) 156/73 (BP Location: Right Arm)   Pulse 62   Temp 98.1 F (36.7 C) (Oral)   Resp 18   SpO2 94%  CONSTITUTIONAL: Alert and oriented and responds appropriately to questions. Well-appearing; well-nourished, elderly, no distress, afebrile, nontoxic, well-hydrated HEAD: Normocephalic EYES: Conjunctivae clear, pupils appear equal, EOM appear intact ENT: normal nose; moist mucous membranes NECK: Supple, normal ROM CARD: RRR; S1 and S2 appreciated; no murmurs, no clicks, no rubs, no gallops RESP: Normal chest excursion without splinting or tachypnea; breath sounds clear and equal bilaterally; no wheezes, no rhonchi, no rales, no hypoxia or respiratory distress, speaking full sentences ABD/GI: Normal bowel sounds; non-distended; soft, non-tender, no rebound, no guarding, no peritoneal signs, no hepatosplenomegaly, no tenderness at McBurney's point, negative Murphy sign BACK:  The back appears normal, no midline spinal tenderness or step-off or deformity, no soft tissue swelling, no redness or warmth, no rash or other lesions EXT: Normal ROM in all joints; no deformity noted, no  edema; no cyanosis SKIN: Normal color for age and race; warm; no rash on exposed skin NEURO: Moves all extremities equally, normal sensation diffusely, normal speech, no saddle anesthesia PSYCH: The patient's mood and manner are appropriate.   MEDICAL DECISION MAKING: Patient here with urinary frequency and then flank pain.  Symptoms have now resolved.  Was seen in urgent care on 12/22/2019.  CMP at that time showed no significant abnormality.  She does have mildly elevated creatinine but it was stable compared to her previous at 1.2.  Urine at that time showed no infection and culture grew 50,000 colonies of multiple species.  Urine today reviewed/interpreted and shows no pyuria, bacteriuria or hematuria.  I have resent a urine culture.  I have reviewed/interpreted CT of her abdomen pelvis here which shows no acute intra-abdominal pelvic pathology.  She does have punctate nonobstructing right renal calculi versus parenchymal calcification but no ureterolithiasis, hydronephrosis, obstruction.  No  perinephric stranding.  Appendix appears normal.  Gallbladder appears normal.  Her exam is very reassuring.  I have low suspicion that this is dissection, cholecystitis, cholangitis, pyelonephritis, appendicitis, bowel obstruction, perforation given reassuring work-up.  I do not feel she needs repeat labs drawn today.  I do not think this is cauda equina, spinal stenosis.  I feel she is safe for discharge and she is also comfortable with this plan.  She has a PCP for follow-up.  Recommended over-the-counter Tylenol as needed.  Discussed return precautions.  At this time, I do not feel there is any life-threatening condition present. I have reviewed, interpreted and discussed all results (EKG, imaging, lab, urine as appropriate) and exam findings with patient/family. I have reviewed nursing notes and appropriate previous records.  I feel the patient is safe to be discharged home without further emergent workup and can  continue workup as an outpatient as needed. Discussed usual and customary return precautions. Patient/family verbalize understanding and are comfortable with this plan.  Outpatient follow-up has been provided as needed. All questions have been answered.   Tanika Obradovich Doyle was evaluated in Emergency Department on 12/25/2019 for the symptoms described in the history of present illness. She was evaluated in the context of the global COVID-19 pandemic, which necessitated consideration that the patient might be at risk for infection with the SARS-CoV-2 virus that causes COVID-19. Institutional protocols and algorithms that pertain to the evaluation of patients at risk for COVID-19 are in a state of rapid change based on information released by regulatory bodies including the CDC and federal and state organizations. These policies and algorithms were followed during the patient's care in the ED.      Bera Pinela, Delice Bison, DO 12/25/19 0543    Keirston Saephanh, Delice Bison, DO 12/25/19 [id number]

## 2020-01-02 DIAGNOSIS — R109 Unspecified abdominal pain: Secondary | ICD-10-CM | POA: Diagnosis not present

## 2020-01-28 ENCOUNTER — Encounter: Payer: Self-pay | Admitting: Internal Medicine

## 2020-01-28 ENCOUNTER — Ambulatory Visit (INDEPENDENT_AMBULATORY_CARE_PROVIDER_SITE_OTHER): Payer: Medicare Other | Admitting: Internal Medicine

## 2020-01-28 ENCOUNTER — Other Ambulatory Visit: Payer: Self-pay

## 2020-01-28 DIAGNOSIS — R109 Unspecified abdominal pain: Secondary | ICD-10-CM

## 2020-01-28 DIAGNOSIS — R351 Nocturia: Secondary | ICD-10-CM | POA: Diagnosis not present

## 2020-01-28 DIAGNOSIS — I10 Essential (primary) hypertension: Secondary | ICD-10-CM | POA: Diagnosis not present

## 2020-01-28 MED ORDER — SPIRONOLACTONE 50 MG PO TABS: 50.0000 mg | ORAL_TABLET | Freq: Every day | ORAL | 3 refills | Status: DC

## 2020-01-28 MED ORDER — LOVASTATIN 40 MG PO TABS: 40.0000 mg | ORAL_TABLET | Freq: Every day | ORAL | 3 refills | Status: DC

## 2020-01-28 MED ORDER — OXYBUTYNIN CHLORIDE 5 MG PO TABS
5.0000 mg | ORAL_TABLET | Freq: Every day | ORAL | 5 refills | Status: DC
Start: 2020-01-28 — End: 2020-06-10

## 2020-01-28 MED ORDER — FAMOTIDINE 40 MG PO TABS: 40.0000 mg | ORAL_TABLET | Freq: Every day | ORAL | 3 refills | Status: DC

## 2020-01-28 MED ORDER — METOPROLOL TARTRATE 50 MG PO TABS: 50.0000 mg | ORAL_TABLET | Freq: Two times a day (BID) | ORAL | 3 refills | Status: DC

## 2020-01-28 NOTE — Assessment & Plan Note (Signed)
Try Ditropan po qhs w/caution

## 2020-01-28 NOTE — Assessment & Plan Note (Signed)
Resolved S/p Urol w/up

## 2020-01-28 NOTE — Assessment & Plan Note (Signed)
Better BP Readings from Last 3 Encounters:  01/28/20 128/80  12/25/19 (!) 174/72  12/22/19 (!) 166/68

## 2020-01-28 NOTE — Progress Notes (Signed)
Subjective:  Patient ID: Jasmin [name], female    DOB: 10-18-1933  Age: 86 y.o. MRN: [id number]  CC: No chief complaint on file.   HPI Ryllie Nieland Deery presents for urinary urgency, frequency - has to go 7 times/night She saw a urologist for flank pain F/u HTN  Outpatient Medications Prior to Visit  Medication Sig Dispense Refill  . Cholecalciferol 1000 units tablet Take 2 tablets (2,000 Units total) by mouth daily. 100 tablet 5  . colchicine 0.6 MG tablet Take two tablets prn gout attack.Then take another one in 1-2 hrs. Do not repeat for 3 days. 18 tablet 1  . diclofenac sodium (VOLTAREN) 1 % GEL Apply 1 application topically 4 (four) times daily. 100 g 3  . famotidine (PEPCID) 40 MG tablet TAKE 1 TABLET BY MOUTH  DAILY 90 tablet 3  . gabapentin (NEURONTIN) 100 MG capsule Take 1 capsule (100 mg total) by mouth at bedtime. 90 capsule 3  . hydrocortisone (ANUSOL-HC) 25 MG suppository Place 1 suppository (25 mg total) rectally at bedtime as needed for hemorrhoids or anal itching. 30 suppository 0  . lovastatin (MEVACOR) 40 MG tablet TAKE 1 TABLET BY MOUTH AT  BEDTIME 90 tablet 1  . metoprolol tartrate (LOPRESSOR) 50 MG tablet TAKE 1 TABLET BY MOUTH  TWICE DAILY 180 tablet 1  . polyethylene glycol powder (GLYCOLAX/MIRALAX) powder Take 17 g by mouth 2 (two) times daily as needed for mild constipation, moderate constipation or severe constipation. (Patient taking differently: Take 17 g by mouth as needed for mild constipation, moderate constipation or severe constipation. ) 500 g 3  . spironolactone (ALDACTONE) 50 MG tablet Take 1 tablet (50 mg total) by mouth daily. Annual appt due in Febuary must see provider for future refills (Patient not taking: Reported on 01/28/2020) 90 tablet 0  . ciprofloxacin (CIPRO) 250 MG tablet Take 1 tablet (250 mg total) by mouth 2 (two) times daily. (Patient not taking: Reported on 01/28/2020) 14 tablet 0   No facility-administered medications prior to  visit.    ROS: Review of Systems  Constitutional: Negative for activity change, appetite change, chills, fatigue and unexpected weight change.  HENT: Negative for congestion, mouth sores and sinus pressure.   Eyes: Negative for visual disturbance.  Respiratory: Negative for cough and chest tightness.   Gastrointestinal: Negative for abdominal pain and nausea.  Genitourinary: Positive for frequency and urgency. Negative for difficulty urinating, dysuria, enuresis, flank pain and vaginal pain.  Musculoskeletal: Negative for back pain and gait problem.  Skin: Negative for pallor and rash.  Neurological: Negative for dizziness, tremors, weakness, numbness and headaches.  Psychiatric/Behavioral: Negative for confusion and sleep disturbance.    Objective:  BP 128/80 (BP Location: Left Arm, Patient Position: Sitting, Cuff Size: Large)   Pulse 66   Temp 98 F (36.7 C) (Oral)   Ht 5\' 6"  (1.676 m)   Wt 224 lb (101.6 kg)   SpO2 99%   BMI 36.15 kg/m   BP Readings from Last 3 Encounters:  01/28/20 128/80  12/25/19 (!) 174/72  12/22/19 (!) 166/68    Wt Readings from Last 3 Encounters:  01/28/20 224 lb (101.6 kg)  10/24/19 224 lb (101.6 kg)  06/07/19 221 lb (100.2 kg)    Physical Exam Constitutional:      General: She is not in acute distress.    Appearance: She is well-developed.  HENT:     Head: Normocephalic.     Right Ear: External ear normal.  Left Ear: External ear normal.     Nose: Nose normal.  Eyes:     General:        Right eye: No discharge.        Left eye: No discharge.     Conjunctiva/sclera: Conjunctivae normal.     Pupils: Pupils are equal, round, and reactive to light.  Neck:     Thyroid: No thyromegaly.     Vascular: No JVD.     Trachea: No tracheal deviation.  Cardiovascular:     Rate and Rhythm: Normal rate and regular rhythm.     Heart sounds: Normal heart sounds.  Pulmonary:     Effort: No respiratory distress.     Breath sounds: No stridor.  No wheezing.  Abdominal:     General: Bowel sounds are normal. There is no distension.     Palpations: Abdomen is soft. There is no mass.     Tenderness: There is no abdominal tenderness. There is no guarding or rebound.  Musculoskeletal:        General: No tenderness.     Cervical back: Normal range of motion and neck supple.  Lymphadenopathy:     Cervical: No cervical adenopathy.  Skin:    Findings: No erythema or rash.  Neurological:     Cranial Nerves: No cranial nerve deficit.     Motor: No abnormal muscle tone.     Coordination: Coordination normal.     Deep Tendon Reflexes: Reflexes normal.  Psychiatric:        Behavior: Behavior normal.        Thought Content: Thought content normal.        Judgment: Judgment normal.     Lab Results  Component Value Date   WBC 3.0 (L) 04/09/2019   HGB 14.0 04/09/2019   HCT 41.7 04/09/2019   PLT 93.0 (L) 04/09/2019   GLUCOSE 101 (H) 12/22/2019   CHOL 170 02/16/2017   TRIG 126.0 02/16/2017   HDL 55.80 02/16/2017   LDLDIRECT 139.3 05/31/2012   LDLCALC 89 02/16/2017   ALT 14 12/22/2019   AST 20 12/22/2019   NA 142 12/22/2019   K 4.4 12/22/2019   CL 107 12/22/2019   CREATININE 1.21 (H) 12/22/2019   BUN 18 12/22/2019   CO2 26 12/22/2019   TSH 1.02 02/16/2017   INR 1.16 08/16/2009    CT Renal Stone Study  Result Date: 12/25/2019 CLINICAL DATA:  86 year old female with flank pain. Concern for kidney stone. EXAM: CT ABDOMEN AND PELVIS WITHOUT CONTRAST TECHNIQUE: Multidetector CT imaging of the abdomen and pelvis was performed following the standard protocol without IV contrast. COMPARISON:  CT abdomen pelvis dated 12/04/2010. FINDINGS: Evaluation of this exam is limited in the absence of intravenous contrast. Lower chest: The visualized lung bases are clear. No intra-abdominal free air or free fluid. Hepatobiliary: Multiple hepatic hypodense lesions measuring up to 6.5 cm in the left lobe of the liver. These lesions demonstrate fluid  attenuation most consistent with cysts. No intrahepatic biliary ductal dilatation. The gallbladder is unremarkable. Pancreas: Unremarkable. No pancreatic ductal dilatation or surrounding inflammatory changes. Spleen: Normal in size without focal abnormality. Adrenals/Urinary Tract: The right adrenal gland is unremarkable. There is a 1 cm left adrenal adenoma. Several punctate calcific foci in the right kidney may represent foci of parenchymal calcification versus nonobstructing calculi. No hydronephrosis. There is no hydronephrosis or nephrolithiasis on the left. Multiple bilateral parapelvic cysts noted. The visualized ureters and urinary bladder appear unremarkable. Stomach/Bowel: Colonic diverticulosis without active  inflammatory changes. There is no bowel obstruction or active inflammation. The appendix is normal. There is a small hiatal hernia. Vascular/Lymphatic: Mild aortoiliac atherosclerotic disease. The IVC is unremarkable. No portal venous gas. There is no adenopathy. Reproductive: The uterus and ovaries are grossly unremarkable. Other: Small fat containing umbilical hernia. Musculoskeletal: Osteopenia with degenerative changes of the spine. No acute osseous pathology. IMPRESSION: 1. No acute intra-abdominal or pelvic pathology. No hydronephrosis or obstructing stone. 2. Punctate nonobstructing right renal calculi versus parenchymal calcification. 3. Colonic diverticulosis. No bowel obstruction. Normal appendix. 4. Left adrenal adenoma. 5. Aortic Atherosclerosis (ICD10-I70.0). Electronically Signed   By: Anner Crete M.D.   On: 12/25/2019 03:15    Assessment & Plan:   There are no diagnoses linked to this encounter.   No orders of the defined types were placed in this encounter.    Follow-up: No follow-ups on file.  Walker Kehr, MD

## 2020-02-13 ENCOUNTER — Ambulatory Visit: Payer: Medicare Other | Admitting: Internal Medicine

## 2020-03-13 ENCOUNTER — Other Ambulatory Visit: Payer: Self-pay

## 2020-03-13 ENCOUNTER — Ambulatory Visit (INDEPENDENT_AMBULATORY_CARE_PROVIDER_SITE_OTHER): Payer: Medicare Other

## 2020-03-13 VITALS — BP 126/80 | HR 64 | Temp 98.2°F | Resp 16 | Ht 66.0 in | Wt 217.6 lb

## 2020-03-13 DIAGNOSIS — Z0000 Encounter for general adult medical examination without abnormal findings: Secondary | ICD-10-CM | POA: Diagnosis not present

## 2020-03-13 NOTE — Progress Notes (Addendum)
Subjective:   Jasmin [name] is a 86 y.o. female who presents for Medicare Annual (Subsequent) preventive examination.  Review of Systems    NO ROS. Medicare Wellness Visit Cardiac Risk Factors include: advanced age (>67men, >81 women);dyslipidemia;hypertension;obesity (BMI >30kg/m2)     Objective:    Today's Vitals   03/13/20 1200  BP: 126/80  Pulse: 64  Resp: 16  Temp: 98.2 F (36.8 C)  SpO2: 98%  Weight: (!) 217 lb 9.6 oz (98.7 kg)  Height: 5\' 6"  (1.676 m)  PainSc: 0-No pain   Body mass index is 35.12 kg/m.  Advanced Directives 03/13/2020 03/08/2019 09/24/2018 03/02/2018 07/13/2017 02/28/2017 12/10/2015  Does Patient Have a Medical Advance Directive? Yes Yes No Yes No No No  Type of Paramedic of [place];Living will [place];Living will - [place];Living will - - -  Does patient want to make changes to medical advance directive? No - Patient declined - - - - - -  Copy of [place] in Chart? No - copy requested No - copy requested - No - copy requested - - -  Would patient like information on creating a medical advance directive? - - - - - - Yes - Educational materials given    Current Medications (verified) Outpatient Encounter Medications as of 03/13/2020  Medication Sig   Cholecalciferol 1000 units tablet Take 2 tablets (2,000 Units total) by mouth daily.   colchicine 0.6 MG tablet Take two tablets prn gout attack.Then take another one in 1-2 hrs. Do not repeat for 3 days.   diclofenac sodium (VOLTAREN) 1 % GEL Apply 1 application topically 4 (four) times daily.   famotidine (PEPCID) 40 MG tablet Take 1 tablet (40 mg total) by mouth daily.   gabapentin (NEURONTIN) 100 MG capsule Take 1 capsule (100 mg total) by mouth at bedtime.   hydrocortisone (ANUSOL-HC) 25 MG suppository Place 1 suppository (25 mg total) rectally at bedtime as needed for hemorrhoids or anal itching.   lovastatin  (MEVACOR) 40 MG tablet Take 1 tablet (40 mg total) by mouth at bedtime.   metoprolol tartrate (LOPRESSOR) 50 MG tablet Take 1 tablet (50 mg total) by mouth 2 (two) times daily.   oxybutynin (DITROPAN) 5 MG tablet Take 1 tablet (5 mg total) by mouth at bedtime.   polyethylene glycol powder (GLYCOLAX/MIRALAX) powder Take 17 g by mouth 2 (two) times daily as needed for mild constipation, moderate constipation or severe constipation. (Patient taking differently: Take 17 g by mouth as needed for mild constipation, moderate constipation or severe constipation. )   spironolactone (ALDACTONE) 50 MG tablet Take 1 tablet (50 mg total) by mouth daily.   No facility-administered encounter medications on file as of 03/13/2020.    Allergies (verified) Allopurinol, Doxycycline, Hctz [hydrochlorothiazide], Omeprazole, Sulfonamide derivatives, Clarithromycin, and Penicillins   History: Past Medical History:  Diagnosis Date   Contact dermatitis and other eczema, due to unspecified cause    Diverticulosis of colon    Esophageal stricture    GERD with stricture    Dr Henrene Pastor   Gout    Hemorrhoids    Hiatal hernia    Hyperlipidemia    Hypertension    Onychomycosis    Personal history of urinary calculi    Polycystic kidney    Tubulovillous adenoma polyp of colon    Unspecified vitamin D deficiency    Vitamin B12 deficiency 2009   Borderline   Past Surgical History:  Procedure Laterality  Date   CATARACT EXTRACTION Bilateral 2017   DILATION AND CURETTAGE OF UTERUS  1977   TOE SURGERY  2012   Right foot    TONSILLECTOMY     as child   Family History  Problem Relation Age of Onset   Hypertension Father    Colon cancer Neg Hx    Esophageal cancer Neg Hx    Rectal cancer Neg Hx    Stomach cancer Neg Hx    Social History   Socioeconomic History   Marital status: Married    Spouse name: Not on file   Number of children: 2   Years of education: Not on file   Highest education level: Not on  file  Occupational History   Occupation: RETIRED  Tobacco Use   Smoking status: Never Smoker   Smokeless tobacco: Never Used  Scientific laboratory technician Use: Never used  Substance and Sexual Activity   Alcohol use: No    Alcohol/week: 0.0 standard drinks   Drug use: No   Sexual activity: Not on file  Other Topics Concern   Not on file  Social History Narrative   GYN: Dr Philis Pique      FAMILY HISTORY HYPERTENSION      Regular exercise - NO   Social Determinants of Health   Financial Resource Strain: Low Risk    Difficulty of Paying Living Expenses: Not hard at all  Food Insecurity: No Food Insecurity   Worried About Charity fundraiser in the Last Year: Never true   [place] in the Last Year: Never true  Transportation Needs: No Transportation Needs   Lack of Transportation (Medical): No   Lack of Transportation (Non-Medical): No  Physical Activity: Sufficiently Active   Days of Exercise per Week: 5 days   Minutes of Exercise per Session: 30 min  Stress: No Stress Concern Present   Feeling of Stress : Not at all  Social Connections: Socially Integrated   Frequency of Communication with Friends and Family: More than three times a week   Frequency of Social Gatherings with Friends and Family: More than three times a week   Attends Religious Services: More than 4 times per year   Active Member of [employer] or Organizations: Yes   Attends Music therapist: More than 4 times per year   Marital Status: Married    Tobacco Counseling Counseling given: No   Clinical Intake:     Pain : No/denies pain Pain Score: 0-No pain     BMI - recorded: 35.12 Nutritional Status: BMI > 30  Obese Nutritional Risks: None Diabetes: No  How often do you need to have someone help you when you read instructions, pamphlets, or other written materials from your doctor or pharmacy?: 1 - Never What is the last grade level you completed in school?: HSG  Diabetic?  no  Interpreter Needed?: No  Information entered by :: Stephanny Tsutsui N. Jordy Verba, LPN   Activities of Daily Living In your present state of health, do you have any difficulty performing the following activities: 03/13/2020  Hearing? N  Vision? N  Difficulty concentrating or making decisions? N  Walking or climbing stairs? N  Dressing or bathing? N  Doing errands, shopping? N  Preparing Food and eating ? N  Using the Toilet? N  In the past six months, have you accidently leaked urine? N  Do you have problems with loss of bowel control? N  Managing your Medications? N  Managing  your Finances? N  Housekeeping or managing your Housekeeping? N  Some recent data might be hidden    Patient Care Team: Plotnikov, Evie Lacks, MD as PCP - General Henrene Pastor Docia Chuck, MD (Gastroenterology) Janyth Pupa, DO as Consulting Physician (Obstetrics and Gynecology) Leandrew Koyanagi, MD as Attending Physician (Orthopedic Surgery) [facility]  Indicate any recent Medical Services you may have received from other than [facility] providers in the past year (date may be approximate).     Assessment:   This is a routine wellness examination for Azriella.  Hearing/Vision screen No exam data present  Dietary issues and exercise activities discussed: Current Exercise Habits: Structured exercise class, Type of exercise: strength training/weights;stretching;treadmill;walking, Time (Minutes): 30, Frequency (Times/Week): 5, Weekly Exercise (Minutes/Week): 150, Intensity: Moderate, Exercise limited by: None identified  Goals       Exercise 150 minutes per week (moderate activity)      Continue with silver sneakers; and may consider zumba or water areobics Try to build up to 5 days a week to help with weight loss Walking counts!!       Patient Stated      Continue to do the classes at [employer]. I will increase the amount of fluids and water I drink. I plan to buy flavor packets to put in the  water.       Patient Stated (pt-stated)      To maintain and stay healthy.       Depression Screen PHQ 2/9 Scores 03/13/2020 06/07/2019 03/08/2019 03/02/2018 08/30/2017 12/10/2015 01/29/2015  PHQ - 2 Score 0 0 0 0 0 0 0    Fall Risk Fall Risk  03/13/2020 06/07/2019 03/08/2019 03/02/2018 08/30/2017  Falls in the past year? 0 0 0 No No  Number falls in past yr: 0 - 0 - -  Injury with Fall? 0 - - - -  Risk for fall due to : No Fall Risks - Impaired mobility;Impaired balance/gait - -  Follow up Falls evaluation completed;Education provided Falls evaluation completed Falls prevention discussed - -    Any stairs in or around the home? Yes  If so, are there any without handrails? No  Home free of loose throw rugs in walkways, pet beds, electrical cords, etc? Yes  Adequate lighting in your home to reduce risk of falls? Yes   ASSISTIVE DEVICES UTILIZED TO PREVENT FALLS:  Life alert? No  Use of a cane, walker or w/c? No  Grab bars in the bathroom? No  Shower chair or bench in shower? Yes  Elevated toilet seat or a handicapped toilet? No   TIMED UP AND GO:  Was the test performed? No .  Length of time to ambulate 10 feet: 0 sec.   Gait steady and fast without use of assistive device  Cognitive Function: MMSE - Mini Mental State Exam 03/02/2018  Orientation to time 5  Orientation to Place 5  Registration 3  Attention/ Calculation 3  Recall 3  Language- name 2 objects 2  Language- repeat 1  Language- follow 3 step command 3  Language- read & follow direction 1  Write a sentence 1  Copy design 1  Total score 28     6CIT Screen 03/13/2020  What Year? 0 points  What month? 0 points  What time? 0 points  Count back from 20 0 points  Months in reverse 0 points  Repeat phrase 0 points  Total Score 0    Immunizations Immunization History  Administered Date(s) Administered  Fluad Quad(high Dose 65+) 04/11/2019   Influenza Split 05/20/2011, 05/31/2012   Influenza Whole 08/24/2005,  06/21/2008, 06/10/2009   Influenza, High Dose Seasonal PF 06/15/2017, 06/02/2018   Influenza,inj,Quad PF,6+ Mos 05/22/2013, 05/29/2014, 06/04/2015, 04/16/2016   PFIZER SARS-COV-2 Vaccination 09/02/2019, 10/13/2019   Pneumococcal Conjugate-13 09/21/2013   Pneumococcal Polysaccharide-23 08/25/2004, 11/12/2009, 05/31/2012   Td 03/02/2013   Zoster Recombinat (Shingrix) 03/02/2017, 06/13/2017    TDAP status: Up to date Flu Vaccine status: Up to date Pneumococcal vaccine status: Up to date Covid-19 vaccine status: Completed vaccines  Qualifies for Shingles Vaccine? Yes   Zostavax completed No   Shingrix Completed?: Yes  Screening Tests Health Maintenance  Topic Date Due   COLONOSCOPY  03/14/2020   INFLUENZA VACCINE  03/23/2020   TETANUS/TDAP  03/03/2023   DEXA SCAN  Completed   COVID-19 Vaccine  Completed   PNA vac Low Risk Adult  Completed    Health Maintenance  There are no preventive care reminders to display for this patient.  Colorectal cancer screening: No longer required.  Mammogram status: No longer required.   Bone Density Scan: last done 12/21/2016; Results: Moderate bone loss  Lung Cancer Screening: (Low Dose CT Chest recommended if Age 88-80 years, 30 pack-year currently smoking OR have quit w/in 15years.) does not qualify.   Lung Cancer Screening Referral: no  Additional Screening:  Hepatitis C Screening: does not qualify; Completed no  Vision Screening: Recommended annual ophthalmology exams for early detection of glaucoma and other disorders of the eye. Is the patient up to date with their annual eye exam?  Yes  Who is the provider or what is the name of the office in which the patient attends annual eye exams? [facility] If pt is not established with a provider, would they like to be referred to a provider to establish care? No .   Dental Screening: Recommended annual dental exams for proper oral hygiene  Community Resource Referral / Chronic Care  Management: CRR required this visit?  No   CCM required this visit?  No      Plan:     I have personally reviewed and noted the following in the patient's chart:   Medical and social history Use of alcohol, tobacco or illicit drugs  Current medications and supplements Functional ability and status Nutritional status Physical activity Advanced directives List of other physicians Hospitalizations, surgeries, and ER visits in previous 12 months Vitals Screenings to include cognitive, depression, and falls Referrals and appointments  In addition, I have reviewed and discussed with patient certain preventive protocols, quality metrics, and best practice recommendations. A written personalized care plan for preventive services as well as general preventive health recommendations were provided to patient.     Sheral Flow, LPN   [id number]   Nurse Notes: n/a  Medical screening examination/treatment/procedure(s) were performed by non-physician practitioner and as supervising physician I was immediately available for consultation/collaboration.  I agree with above. Lew Dawes, MD

## 2020-03-13 NOTE — Patient Instructions (Signed)
[name] , Thank you for taking time to come for your Medicare Wellness Visit. I appreciate your ongoing commitment to your health goals. Please review the following plan we discussed and let me know if I can assist you in the future.   Screening recommendations/referrals: Colonoscopy: 03/14/2017; no repeat due to age  Mammogram: 03/19/2015 Bone Density: 12/21/2016 Recommended yearly ophthalmology/optometry visit for glaucoma screening and checkup Recommended yearly dental visit for hygiene and checkup  Vaccinations: Influenza vaccine: 04/11/2019 Pneumococcal vaccine: completed Tdap vaccine: 03/02/2013 Shingles vaccine: completed   Covid-19: completed  Advanced directives: Please bring a copy of your health care power of attorney and living will to the office at your convenience.  Conditions/risks identified: Yes; Please continue to do your personal lifestyle choices by: daily care of teeth and gums, regular physical activity (goal should be 5 days a week for 30 minutes), eat a healthy diet, avoid tobacco and drug use, limiting any alcohol intake, taking a low-dose aspirin (if not allergic or have been advised by your provider otherwise) and taking vitamins and minerals as recommended by your provider. Continue doing brain stimulating activities (puzzles, reading, adult coloring books, staying active) to keep memory sharp. Continue to eat heart healthy diet (full of fruits, vegetables, whole grains, lean protein, water--limit salt, fat, and sugar intake) and increase physical activity as tolerated.  Next appointment: Please schedule your next Medicare Wellness Visit with your Nurse Health Advisor in 1 year.  Preventive Care 39 Years and Older, Female Preventive care refers to lifestyle choices and visits with your health care provider that can promote health and wellness. What does preventive care include?  A yearly physical exam. This is also called an annual well check.  Dental exams  once or twice a year.  Routine eye exams. Ask your health care provider how often you should have your eyes checked.  Personal lifestyle choices, including:  Daily care of your teeth and gums.  Regular physical activity.  Eating a healthy diet.  Avoiding tobacco and drug use.  Limiting alcohol use.  Practicing safe sex.  Taking low-dose aspirin every day.  Taking vitamin and mineral supplements as recommended by your health care provider. What happens during an annual well check? The services and screenings done by your health care provider during your annual well check will depend on your age, overall health, lifestyle risk factors, and family history of disease. Counseling  Your health care provider may ask you questions about your:  Alcohol use.  Tobacco use.  Drug use.  Emotional well-being.  Home and relationship well-being.  Sexual activity.  Eating habits.  History of falls.  Memory and ability to understand (cognition).  Work and work Statistician.  Reproductive health. Screening  You may have the following tests or measurements:  Height, weight, and BMI.  Blood pressure.  Lipid and cholesterol levels. These may be checked every 5 years, or more frequently if you are over 79 years old.  Skin check.  Lung cancer screening. You may have this screening every year starting at age 33 if you have a 30-pack-year history of smoking and currently smoke or have quit within the past 15 years.  Fecal occult blood test (FOBT) of the stool. You may have this test every year starting at age 74.  Flexible sigmoidoscopy or colonoscopy. You may have a sigmoidoscopy every 5 years or a colonoscopy every 10 years starting at age 60.  Hepatitis C blood test.  Hepatitis B blood test.  Sexually transmitted disease (STD)  testing.  Diabetes screening. This is done by checking your blood sugar (glucose) after you have not eaten for a while (fasting). You may have  this done every 1-3 years.  Bone density scan. This is done to screen for osteoporosis. You may have this done starting at age 71.  Mammogram. This may be done every 1-2 years. Talk to your health care provider about how often you should have regular mammograms. Talk with your health care provider about your test results, treatment options, and if necessary, the need for more tests. Vaccines  Your health care provider may recommend certain vaccines, such as:  Influenza vaccine. This is recommended every year.  Tetanus, diphtheria, and acellular pertussis (Tdap, Td) vaccine. You may need a Td booster every 10 years.  Zoster vaccine. You may need this after age 29.  Pneumococcal 13-valent conjugate (PCV13) vaccine. One dose is recommended after age 44.  Pneumococcal polysaccharide (PPSV23) vaccine. One dose is recommended after age 12. Talk to your health care provider about which screenings and vaccines you need and how often you need them. This information is not intended to replace advice given to you by your health care provider. Make sure you discuss any questions you have with your health care provider. Document Released: 09/05/2015 Document Revised: 04/28/2016 Document Reviewed: 06/10/2015 Elsevier Interactive Patient Education  2017 [place] Prevention in the Home Falls can cause injuries. They can happen to people of all ages. There are many things you can do to make your home safe and to help prevent falls. What can I do on the outside of my home?  Regularly fix the edges of walkways and driveways and fix any cracks.  Remove anything that might make you trip as you walk through a door, such as a raised step or threshold.  Trim any bushes or trees on the path to your home.  Use bright outdoor lighting.  Clear any walking paths of anything that might make someone trip, such as rocks or tools.  Regularly check to see if handrails are loose or broken. Make sure  that both sides of any steps have handrails.  Any raised decks and porches should have guardrails on the edges.  Have any leaves, snow, or ice cleared regularly.  Use sand or salt on walking paths during winter.  Clean up any spills in your garage right away. This includes oil or grease spills. What can I do in the bathroom?  Use night lights.  Install grab bars by the toilet and in the tub and shower. Do not use towel bars as grab bars.  Use non-skid mats or decals in the tub or shower.  If you need to sit down in the shower, use a plastic, non-slip stool.  Keep the floor dry. Clean up any water that spills on the floor as soon as it happens.  Remove soap buildup in the tub or shower regularly.  Attach bath mats securely with double-sided non-slip rug tape.  Do not have throw rugs and other things on the floor that can make you trip. What can I do in the bedroom?  Use night lights.  Make sure that you have a light by your bed that is easy to reach.  Do not use any sheets or blankets that are too big for your bed. They should not hang down onto the floor.  Have a firm chair that has side arms. You can use this for support while you get dressed.  Do not  have throw rugs and other things on the floor that can make you trip. What can I do in the kitchen?  Clean up any spills right away.  Avoid walking on wet floors.  Keep items that you use a lot in easy-to-reach places.  If you need to reach something above you, use a strong step stool that has a grab bar.  Keep electrical cords out of the way.  Do not use floor polish or wax that makes floors slippery. If you must use wax, use non-skid floor wax.  Do not have throw rugs and other things on the floor that can make you trip. What can I do with my stairs?  Do not leave any items on the stairs.  Make sure that there are handrails on both sides of the stairs and use them. Fix handrails that are broken or loose. Make  sure that handrails are as long as the stairways.  Check any carpeting to make sure that it is firmly attached to the stairs. Fix any carpet that is loose or worn.  Avoid having throw rugs at the top or bottom of the stairs. If you do have throw rugs, attach them to the floor with carpet tape.  Make sure that you have a light switch at the top of the stairs and the bottom of the stairs. If you do not have them, ask someone to add them for you. What else can I do to help prevent falls?  Wear shoes that:  Do not have high heels.  Have rubber bottoms.  Are comfortable and fit you well.  Are closed at the toe. Do not wear sandals.  If you use a stepladder:  Make sure that it is fully opened. Do not climb a closed stepladder.  Make sure that both sides of the stepladder are locked into place.  Ask someone to hold it for you, if possible.  Clearly mark and make sure that you can see:  Any grab bars or handrails.  First and last steps.  Where the edge of each step is.  Use tools that help you move around (mobility aids) if they are needed. These include:  Canes.  Walkers.  Scooters.  Crutches.  Turn on the lights when you go into a dark area. Replace any light bulbs as soon as they burn out.  Set up your furniture so you have a clear path. Avoid moving your furniture around.  If any of your floors are uneven, fix them.  If there are any pets around you, be aware of where they are.  Review your medicines with your doctor. Some medicines can make you feel dizzy. This can increase your chance of falling. Ask your doctor what other things that you can do to help prevent falls. This information is not intended to replace advice given to you by your health care provider. Make sure you discuss any questions you have with your health care provider. Document Released: 06/05/2009 Document Revised: 01/15/2016 Document Reviewed: 09/13/2014 Elsevier Interactive Patient Education   2017 [employer].

## 2020-06-10 ENCOUNTER — Telehealth: Payer: Self-pay | Admitting: Internal Medicine

## 2020-06-10 NOTE — Telephone Encounter (Signed)
    Patient calling to report oxybutynin (DITROPAN) 5 MG tablet is causing her to have frequent urination.  Seeking advice

## 2020-06-10 NOTE — Telephone Encounter (Signed)
It is unlikely. However, she can stop it. Thanks

## 2020-06-10 NOTE — Addendum Note (Signed)
Addended by: Cassandria Anger on: 06/10/2020 03:51 PM   Modules accepted: Orders

## 2020-06-11 NOTE — Telephone Encounter (Signed)
   Patient made aware of recommendation to stop medication

## 2020-07-21 ENCOUNTER — Other Ambulatory Visit: Payer: Self-pay | Admitting: Internal Medicine

## 2020-07-25 ENCOUNTER — Other Ambulatory Visit: Payer: Self-pay

## 2020-07-25 ENCOUNTER — Encounter (HOSPITAL_COMMUNITY): Payer: Self-pay | Admitting: Emergency Medicine

## 2020-07-25 ENCOUNTER — Ambulatory Visit (HOSPITAL_COMMUNITY)
Admission: EM | Admit: 2020-07-25 | Discharge: 2020-07-25 | Disposition: A | Discharge status: Home / Self Care | Payer: Medicare Other | Attending: Emergency Medicine | Admitting: Emergency Medicine

## 2020-07-25 DIAGNOSIS — M79671 Pain in right foot: Secondary | ICD-10-CM | POA: Diagnosis not present

## 2020-07-25 MED ORDER — DICLOFENAC SODIUM 1 % EX GEL
4.0000 g | Freq: Four times a day (QID) | CUTANEOUS | 0 refills | Status: DC
Start: 2020-07-25 — End: 2021-06-29

## 2020-07-25 MED ORDER — ACETAMINOPHEN 500 MG PO TABS: 500.0000 mg | ORAL_TABLET | Freq: Four times a day (QID) | ORAL | 0 refills | Status: AC | PRN

## 2020-07-25 NOTE — Discharge Instructions (Signed)
Decrease weight bearing over the next few days to allow for rest.  Ice application and elevate the foot to help with pain.  Tylenol regularly, topical diclofenac gel to affected area.  Follow up with your primary care provider next week for recheck. Return if worsening.

## 2020-07-25 NOTE — ED Provider Notes (Signed)
[facility]    CSN: [id number] Arrival date & time: 07/25/20  1729      History   Chief Complaint Chief Complaint  Patient presents with   Foot Pain    HPI Jasmin [name] is a 86 y.o. female.   Jasmin [name] presents with complaints of right lateral foot pain which started today without injury. [facility]  but this was not new for her and there was not injury with this activity. No new shoes. No numbness or tingling. No redness or warmth. History of gout, this is not as severe as gout, however. Has not taken any medications for pain.     ROS per HPI, negative if not otherwise mentioned.      Past Medical History:  Diagnosis Date   Contact dermatitis and other eczema, due to unspecified cause    Diverticulosis of colon    Esophageal stricture    GERD with stricture    Dr Henrene Pastor   Gout    Hemorrhoids    Hiatal hernia    Hyperlipidemia    Hypertension    Onychomycosis    Personal history of urinary calculi    Polycystic kidney    Tubulovillous adenoma polyp of colon    Unspecified vitamin D deficiency    Vitamin B12 deficiency 2009   Borderline    Patient Active Problem List   Diagnosis Date Noted   Nocturia 01/28/2020   Flank pain 06/07/2019   Bilateral primary osteoarthritis of knee 09/13/2018   Constipation 07/06/2018   Peripheral neuropathic pain 12/28/2017   Gout 08/30/2017   History of colonic polyps 08/13/2016   Lipoma of arm 09/30/2014   WBC decreased 05/22/2013   Low back pain 11/16/2012   Obesity 05/31/2012   Dyspnea 03/10/2012   Edema 03/10/2012   Memory problem 11/24/2011   Internal hemorrhoid 05/20/2011   Well adult exam 11/17/2010   ONYCHOMYCOSIS 06/10/2009   DYSPHAGIA 09/10/2008   B12 deficiency 04/15/2008   ESOPHAGEAL STRICTURE 12/05/2007   Hiatal hernia 12/05/2007   FRACTURE, RIGHT LEG 12/05/2007   NEPHROLITHIASIS, HX OF 12/05/2007    PARESTHESIA 09/22/2007   GERD 08/09/2007   Vitamin D deficiency 03/20/2007   Dyslipidemia 03/20/2007   Essential hypertension 03/20/2007   DIVERTICULOSIS, COLON 03/20/2007    Past Surgical History:  Procedure Laterality Date   CATARACT EXTRACTION Bilateral 2017   DILATION AND CURETTAGE OF UTERUS  1977   TOE SURGERY  2012   Right foot    TONSILLECTOMY     as child    OB History   No obstetric history on file.      Home Medications    Prior to Admission medications   Medication Sig Start Date End Date Taking? Authorizing Provider  acetaminophen (TYLENOL) 500 MG tablet Take 1 tablet (500 mg total) by mouth every 6 (six) hours as needed. 07/25/20   Zigmund Gottron, NP  Cholecalciferol 1000 units tablet Take 2 tablets (2,000 Units total) by mouth daily. 10/06/15   Plotnikov, Evie Lacks, MD  colchicine 0.6 MG tablet Take two tablets prn gout attack.Then take another one in 1-2 hrs. Do not repeat for 3 days. 03/08/19   Plotnikov, Evie Lacks, MD  diclofenac Sodium (VOLTAREN) 1 % GEL Apply 4 g topically 4 (four) times daily. 07/25/20   Zigmund Gottron, NP  famotidine (PEPCID) 40 MG tablet Take 1 tablet (40 mg total) by mouth daily. 01/28/20   Plotnikov, Evie Lacks,  MD  gabapentin (NEURONTIN) 100 MG capsule Take 1 capsule (100 mg total) by mouth at bedtime. 05/31/19   Hyatt, Max T, DPM  hydrocortisone (ANUSOL-HC) 25 MG suppository Place 1 suppository (25 mg total) rectally at bedtime as needed for hemorrhoids or anal itching. 12/23/18   Mauri Pole, MD  lovastatin (MEVACOR) 40 MG tablet Take 1 tablet (40 mg total) by mouth at bedtime. 01/28/20   Plotnikov, Evie Lacks, MD  metoprolol tartrate (LOPRESSOR) 50 MG tablet Take 1 tablet (50 mg total) by mouth 2 (two) times daily. 01/28/20   Plotnikov, Evie Lacks, MD  oxybutynin (DITROPAN) 5 MG tablet TAKE 1 TABLET(5 MG) BY MOUTH AT BEDTIME 07/21/20   Plotnikov, Evie Lacks, MD  polyethylene glycol powder (GLYCOLAX/MIRALAX) powder Take 17 g by  mouth 2 (two) times daily as needed for mild constipation, moderate constipation or severe constipation. Patient taking differently: Take 17 g by mouth as needed for mild constipation, moderate constipation or severe constipation.  07/06/18   Plotnikov, Evie Lacks, MD  spironolactone (ALDACTONE) 50 MG tablet Take 1 tablet (50 mg total) by mouth daily. 01/28/20   Plotnikov, Evie Lacks, MD    Family History Family History  Problem Relation Age of Onset   Hypertension Father    Colon cancer Neg Hx    Esophageal cancer Neg Hx    Rectal cancer Neg Hx    Stomach cancer Neg Hx     Social History Social History   Tobacco Use   Smoking status: Never Smoker   Smokeless tobacco: Never Used  Vaping Use   Vaping Use: Never used  Substance Use Topics   Alcohol use: No    Alcohol/week: 0.0 standard drinks   Drug use: No     Allergies   Allopurinol, Doxycycline, Hctz [hydrochlorothiazide], Omeprazole, Oxybutynin, Sulfonamide derivatives, Clarithromycin, and Penicillins   Review of Systems Review of Systems   Physical Exam Triage Vital Signs ED Triage Vitals  Enc Vitals Group     BP 07/25/20 1911 (!) 152/68     Pulse Rate 07/25/20 1911 76     Resp 07/25/20 1911 17     Temp 07/25/20 1911 98.1 F (36.7 C)     Temp Source 07/25/20 1911 Oral     SpO2 07/25/20 1911 99 %     Weight --      Height --      Head Circumference --      Peak Flow --      Pain Score 07/25/20 1909 8     Pain Loc --      Pain Edu? --      Excl. in [place]? --    No data found.  Updated Vital Signs BP (!) 152/68 (BP Location: Right Arm)    Pulse 76    Temp 98.1 F (36.7 C) (Oral)    Resp 17    SpO2 99%   Visual Acuity Right Eye Distance:   Left Eye Distance:   Bilateral Distance:    Right Eye Near:   Left Eye Near:    Bilateral Near:     Physical Exam Constitutional:      General: She is not in acute distress.    Appearance: She is well-developed.  Cardiovascular:     Rate and Rhythm:  Normal rate.  Pulmonary:     Effort: Pulmonary effort is normal.  Musculoskeletal:     Right foot: Normal range of motion. Tenderness and bony tenderness present. No laceration.  Legs:     Comments: Right lateral foot point tenderness at mid 5th metatarsal without redness swelling or warmth; full ROM OF ankle and toes; cap refill < 2 seconds; strong pedal pulse; no deformity  Skin:    General: Skin is warm and dry.  Neurological:     Mental Status: She is alert and oriented to person, place, and time.      UC Treatments / Results  Labs (all labs ordered are listed, but only abnormal results are displayed) Labs Reviewed - No data to display  EKG   Radiology No results found.  Procedures Procedures (including critical care time)  Medications Ordered in UC Medications - No data to display  Initial Impression / Assessment and Plan / UC Course  I have reviewed the triage vital signs and the nursing notes.  Pertinent labs & imaging results that were available during my care of the patient were reviewed by me and considered in my medical decision making (see chart for details).     No injury, no swelling redness or warmth. Imaging deferred. Ace wrap applied and patient describes immediate improvement. Pain management discussed, strain vs gout vs arthritis considered. Follow up recommendations provided. Patient verbalized understanding and agreeable to plan.  Ambulatory out of clinic without difficulty.   Final Clinical Impressions(s) / UC Diagnoses   Final diagnoses:  Foot pain, right     Discharge Instructions     Decrease weight bearing over the next few days to allow for rest.  Ice application and elevate the foot to help with pain.  Tylenol regularly, topical diclofenac gel to affected area.  Follow up with your primary care provider next week for recheck. Return if worsening.    ED Prescriptions    Medication Sig Dispense Auth. Provider   diclofenac Sodium  (VOLTAREN) 1 % GEL Apply 4 g topically 4 (four) times daily. 350 g Augusto Gamble B, NP   acetaminophen (TYLENOL) 500 MG tablet Take 1 tablet (500 mg total) by mouth every 6 (six) hours as needed. 30 tablet Zigmund Gottron, NP     PDMP not reviewed this encounter.   Zigmund Gottron, NP 07/25/20 [phone]

## 2020-07-25 NOTE — ED Triage Notes (Signed)
Pt presents with right foot pain xs 1 day. Denies any fall or injury. States pain started after pain started after going to [facility] today.

## 2020-07-29 ENCOUNTER — Encounter: Payer: Self-pay | Admitting: Internal Medicine

## 2020-07-29 ENCOUNTER — Ambulatory Visit (INDEPENDENT_AMBULATORY_CARE_PROVIDER_SITE_OTHER): Payer: Medicare Other | Admitting: Internal Medicine

## 2020-07-29 ENCOUNTER — Other Ambulatory Visit: Payer: Self-pay

## 2020-07-29 VITALS — BP 142/70 | HR 63 | Temp 98.3°F | Wt 220.2 lb

## 2020-07-29 DIAGNOSIS — E559 Vitamin D deficiency, unspecified: Secondary | ICD-10-CM

## 2020-07-29 DIAGNOSIS — E785 Hyperlipidemia, unspecified: Secondary | ICD-10-CM

## 2020-07-29 DIAGNOSIS — Z23 Encounter for immunization: Secondary | ICD-10-CM

## 2020-07-29 DIAGNOSIS — E538 Deficiency of other specified B group vitamins: Secondary | ICD-10-CM

## 2020-07-29 DIAGNOSIS — R413 Other amnesia: Secondary | ICD-10-CM

## 2020-07-29 DIAGNOSIS — I10 Essential (primary) hypertension: Secondary | ICD-10-CM | POA: Diagnosis not present

## 2020-07-29 LAB — COMPREHENSIVE METABOLIC PANEL
ALT: 13 U/L (ref 0–35)
AST: 16 U/L (ref 0–37)
Albumin: 4.5 g/dL (ref 3.5–5.2)
Alkaline Phosphatase: 58 U/L (ref 39–117)
BUN: 19 mg/dL (ref 6–23)
CO2: 27 mEq/L (ref 19–32)
Calcium: 9.9 mg/dL (ref 8.4–10.5)
Chloride: 105 mEq/L (ref 96–112)
Creatinine, Ser: 1.24 mg/dL — ABNORMAL HIGH (ref 0.40–1.20)
GFR: 39.93 mL/min — ABNORMAL LOW (ref >60.00)
Glucose, Bld: 89 mg/dL (ref 70–99)
Potassium: 4.1 mEq/L (ref 3.5–5.1)
Sodium: 139 mEq/L (ref 135–145)
Total Bilirubin: 0.6 mg/dL (ref 0.2–1.2)
Total Protein: 7.3 g/dL (ref 6.0–8.3)

## 2020-07-29 LAB — LIPID PANEL
Cholesterol: 169 mg/dL (ref 0–200)
HDL: 61.7 mg/dL (ref >39.00)
LDL Cholesterol: 91 mg/dL (ref 0–99)
NonHDL: 107.6
Total CHOL/HDL Ratio: 3
Triglycerides: 84 mg/dL (ref 0.0–149.0)
VLDL: 16.8 mg/dL (ref 0.0–40.0)

## 2020-07-29 LAB — TSH: TSH: 1.56 u[IU]/mL (ref 0.35–4.50)

## 2020-07-29 NOTE — Assessment & Plan Note (Signed)
On B complex 

## 2020-07-29 NOTE — Addendum Note (Signed)
Addended by: Eddie North C on: 07/29/2020 09:09 AM   Modules accepted: Orders

## 2020-07-29 NOTE — Assessment & Plan Note (Signed)
Vit D 

## 2020-07-29 NOTE — Assessment & Plan Note (Addendum)
BP Readings from Last 3 Encounters:  07/29/20 (!) 142/70  07/25/20 (!) 152/68  03/13/20 126/80   Spironolactone, Toprol

## 2020-07-29 NOTE — Patient Instructions (Signed)
   B-complex with Niacin 100 mg    Lion's mane  

## 2020-07-29 NOTE — Assessment & Plan Note (Signed)
Try Lion's mane 

## 2020-07-29 NOTE — Progress Notes (Signed)
Subjective:  Patient ID: Jasmin [name], female    DOB: [birth year]  Age: 86 y.o. MRN: [id number]  CC: Follow-up (6 month f/u- Flu shot)   HPI Gladyse L Flam presents for HTN, gout, OAB f/u  Outpatient Medications Prior to Visit  Medication Sig Dispense Refill  . acetaminophen (TYLENOL) 500 MG tablet Take 1 tablet (500 mg total) by mouth every 6 (six) hours as needed. 30 tablet 0  . Cholecalciferol 1000 units tablet Take 2 tablets (2,000 Units total) by mouth daily. 100 tablet 5  . colchicine 0.6 MG tablet Take two tablets prn gout attack.Then take another one in 1-2 hrs. Do not repeat for 3 days. 18 tablet 1  . diclofenac Sodium (VOLTAREN) 1 % GEL Apply 4 g topically 4 (four) times daily. 350 g 0  . famotidine (PEPCID) 40 MG tablet Take 1 tablet (40 mg total) by mouth daily. 90 tablet 3  . gabapentin (NEURONTIN) 100 MG capsule Take 1 capsule (100 mg total) by mouth at bedtime. 90 capsule 3  . hydrocortisone (ANUSOL-HC) 25 MG suppository Place 1 suppository (25 mg total) rectally at bedtime as needed for hemorrhoids or anal itching. 30 suppository 0  . lovastatin (MEVACOR) 40 MG tablet Take 1 tablet (40 mg total) by mouth at bedtime. 90 tablet 3  . metoprolol tartrate (LOPRESSOR) 50 MG tablet Take 1 tablet (50 mg total) by mouth 2 (two) times daily. 180 tablet 3  . oxybutynin (DITROPAN) 5 MG tablet TAKE 1 TABLET(5 MG) BY MOUTH AT BEDTIME 30 tablet 5  . polyethylene glycol powder (GLYCOLAX/MIRALAX) powder Take 17 g by mouth 2 (two) times daily as needed for mild constipation, moderate constipation or severe constipation. (Patient taking differently: Take 17 g by mouth as needed for mild constipation, moderate constipation or severe constipation. ) 500 g 3  . spironolactone (ALDACTONE) 50 MG tablet Take 1 tablet (50 mg total) by mouth daily. 90 tablet 3   No facility-administered medications prior to visit.    ROS: Review of Systems  Constitutional: Negative for activity change,  appetite change, chills, fatigue and unexpected weight change.  HENT: Negative for congestion, mouth sores and sinus pressure.   Eyes: Negative for visual disturbance.  Respiratory: Negative for cough and chest tightness.   Gastrointestinal: Negative for abdominal pain and nausea.  Genitourinary: Negative for difficulty urinating, frequency and vaginal pain.  Musculoskeletal: Positive for arthralgias. Negative for back pain and gait problem.  Skin: Negative for pallor and rash.  Neurological: Negative for dizziness, tremors, weakness, numbness and headaches.  Psychiatric/Behavioral: Positive for decreased concentration. Negative for confusion and sleep disturbance.    Objective:  BP (!) 142/70 (BP Location: Left Arm)   Pulse 63   Temp 98.3 F (36.8 C) (Oral)   Wt 220 lb 3.2 oz (99.9 kg)   SpO2 96%   BMI 35.54 kg/m   BP Readings from Last 3 Encounters:  07/29/20 (!) 142/70  07/25/20 (!) 152/68  03/13/20 126/80    Wt Readings from Last 3 Encounters:  07/29/20 220 lb 3.2 oz (99.9 kg)  03/13/20 (!) 217 lb 9.6 oz (98.7 kg)  01/28/20 224 lb (101.6 kg)    Physical Exam Constitutional:      General: She is not in acute distress.    Appearance: She is well-developed. She is obese.  HENT:     Head: Normocephalic.     Right Ear: External ear normal.     Left Ear: External ear normal.     Nose: Nose  normal.  Eyes:     General:        Right eye: No discharge.        Left eye: No discharge.     Conjunctiva/sclera: Conjunctivae normal.     Pupils: Pupils are equal, round, and reactive to light.  Neck:     Thyroid: No thyromegaly.     Vascular: No JVD.     Trachea: No tracheal deviation.  Cardiovascular:     Rate and Rhythm: Normal rate and regular rhythm.     Heart sounds: Normal heart sounds.  Pulmonary:     Effort: No respiratory distress.     Breath sounds: No stridor. No wheezing.  Abdominal:     General: Bowel sounds are normal. There is no distension.      Palpations: Abdomen is soft. There is no mass.     Tenderness: There is no abdominal tenderness. There is no guarding or rebound.  Musculoskeletal:        General: No tenderness.     Cervical back: Normal range of motion and neck supple.  Lymphadenopathy:     Cervical: No cervical adenopathy.  Skin:    Findings: No erythema or rash.  Neurological:     Mental Status: She is oriented to person, place, and time.     Cranial Nerves: No cranial nerve deficit.     Motor: No abnormal muscle tone.     Coordination: Coordination normal.     Deep Tendon Reflexes: Reflexes normal.  Psychiatric:        Behavior: Behavior normal.        Thought Content: Thought content normal.        Judgment: Judgment normal.     Lab Results  Component Value Date   WBC 3.0 (L) 04/09/2019   HGB 14.0 04/09/2019   HCT 41.7 04/09/2019   PLT 93.0 (L) 04/09/2019   GLUCOSE 101 (H) 12/22/2019   CHOL 170 02/16/2017   TRIG 126.0 02/16/2017   HDL 55.80 02/16/2017   LDLDIRECT 139.3 05/31/2012   LDLCALC 89 02/16/2017   ALT 14 12/22/2019   AST 20 12/22/2019   NA 142 12/22/2019   K 4.4 12/22/2019   CL 107 12/22/2019   CREATININE 1.21 (H) 12/22/2019   BUN 18 12/22/2019   CO2 26 12/22/2019   TSH 1.02 02/16/2017   INR 1.16 08/16/2009    No results found.  Assessment & Plan:    Walker Kehr, MD

## 2020-11-03 IMAGING — DX DG KNEE COMPLETE 4+V*R*
4 series · 4 of 4 positions shown · non-contrast
Comparison: None.

CLINICAL DATA: Knee pain and swelling.

EXAM:
RIGHT KNEE - COMPLETE 4+ VIEW

[knee ap]
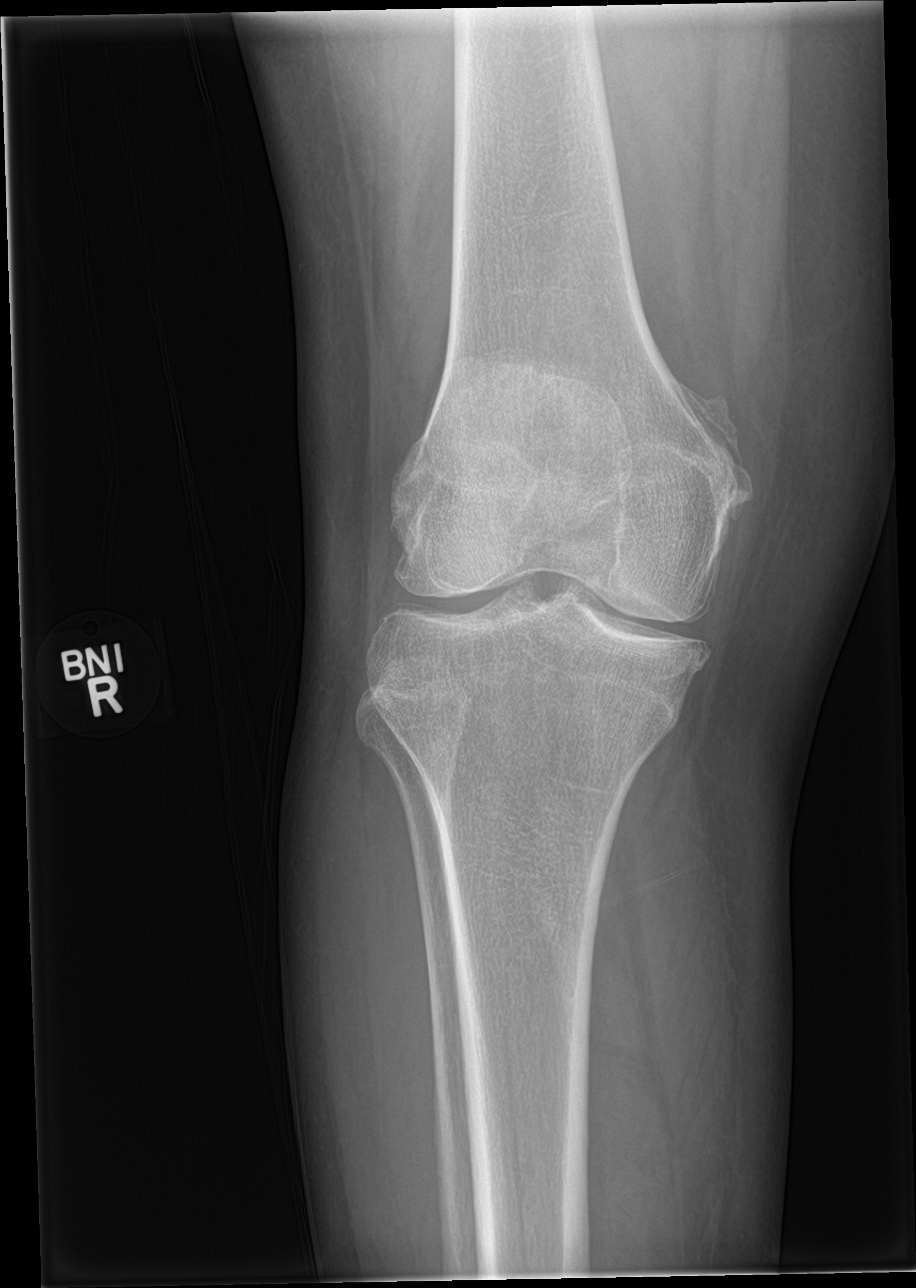

[knee lat]
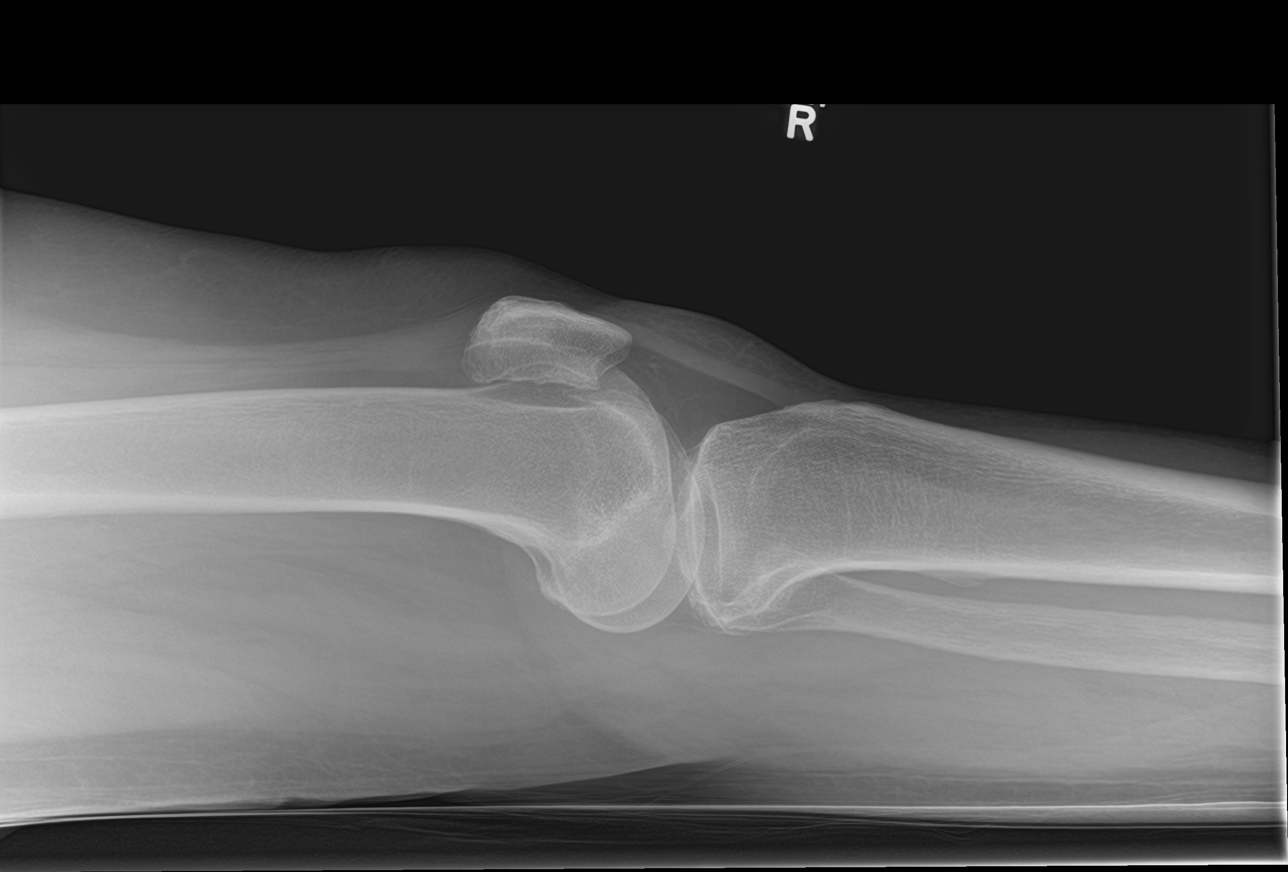

[knee obl (1 of 2)]
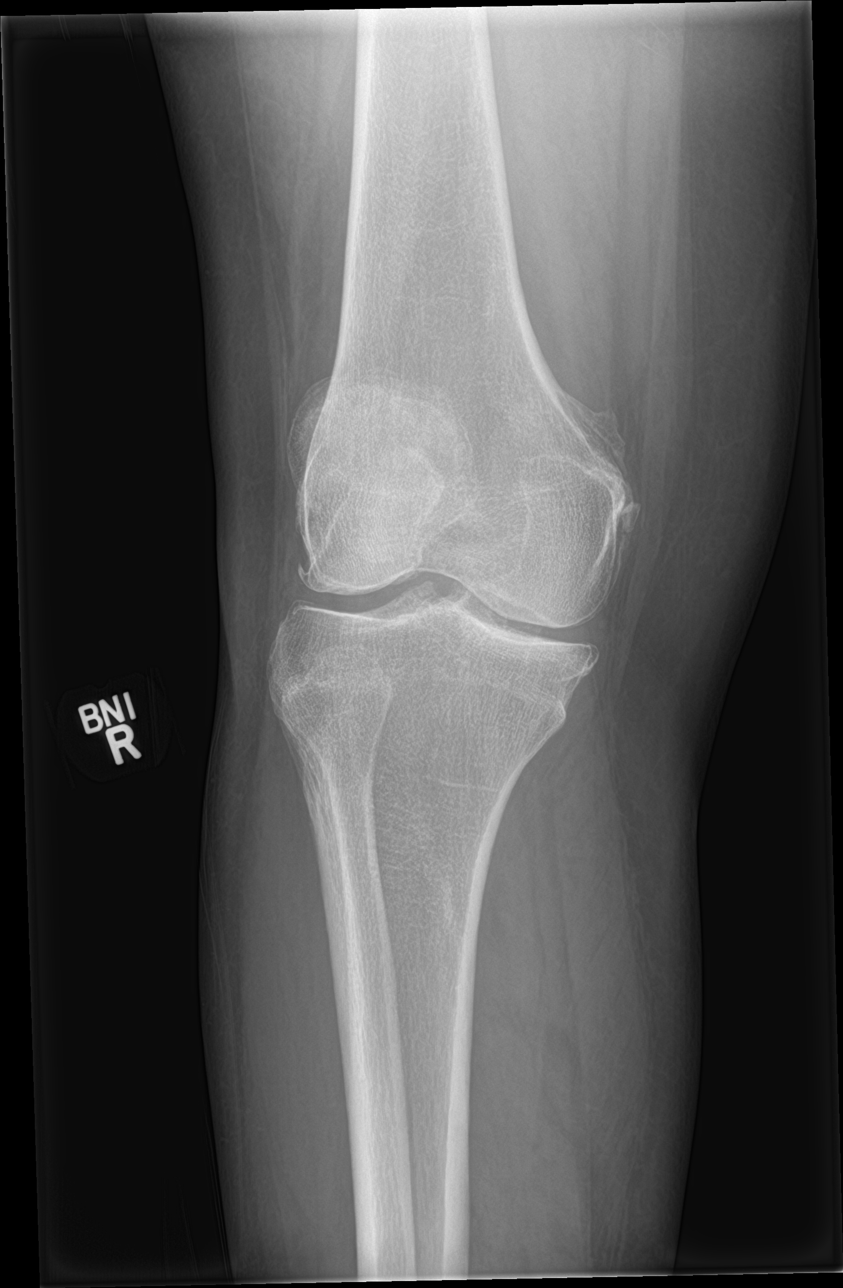

[knee obl (2 of 2)]
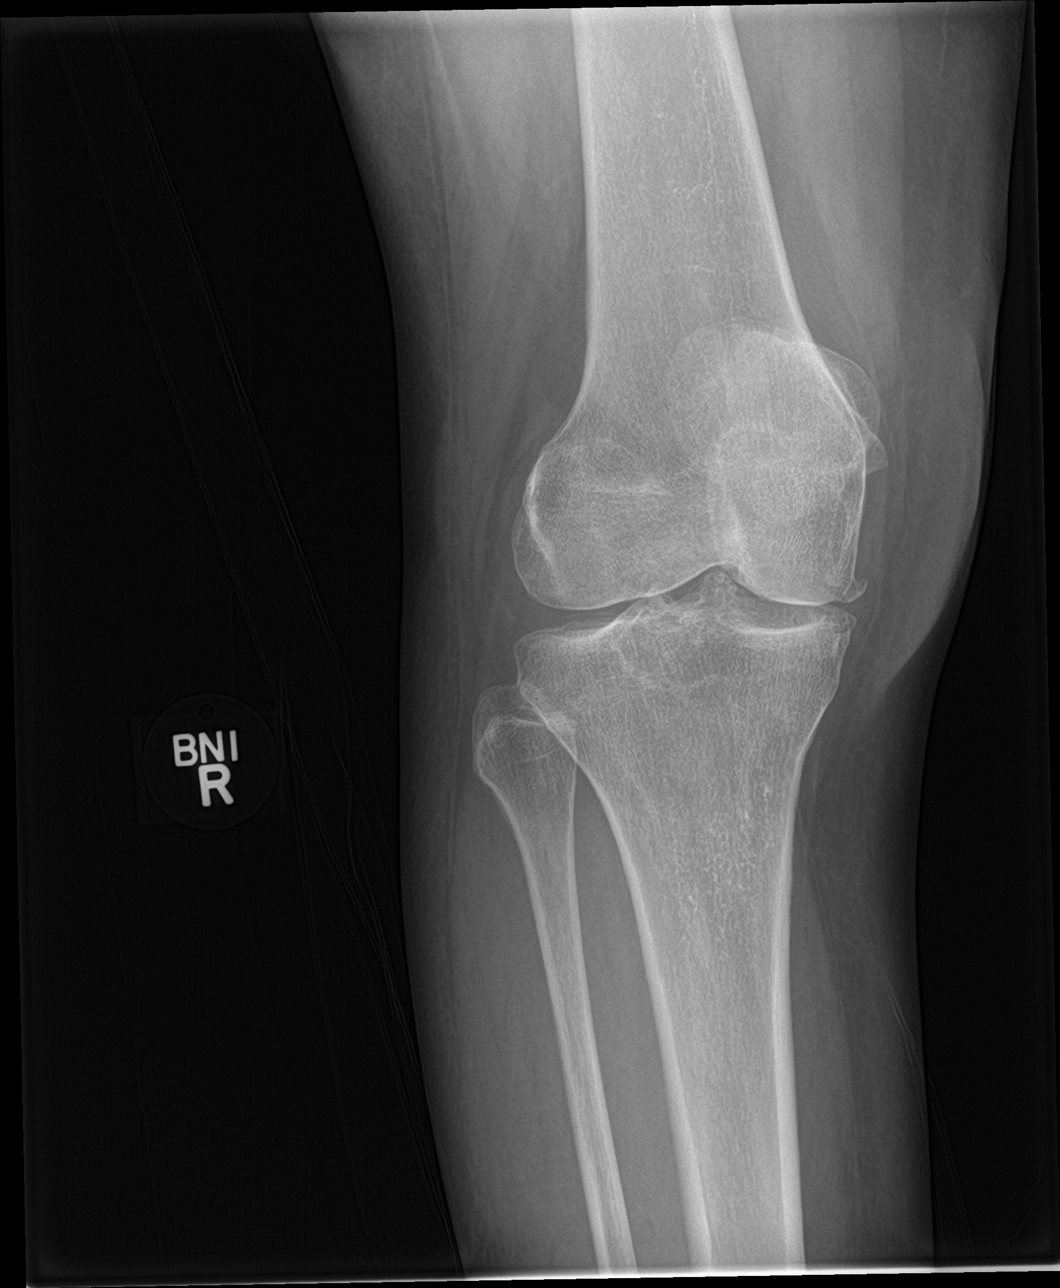

[4 of 4 positions shown; findings below may reference images not displayed]

FINDINGS: No fracture. No subluxation or dislocation. No joint effusion. Loss
of joint space noted medial compartment with meniscal calcification.
Hypertrophic spurring visible in all 3 compartments.
IMPRESSION: Tricompartmental degenerative changes without acute bony
abnormality.

## 2020-11-09 ENCOUNTER — Emergency Department (HOSPITAL_COMMUNITY): Payer: Medicare Other

## 2020-11-09 ENCOUNTER — Emergency Department (HOSPITAL_COMMUNITY)
Admission: EM | Admit: 2020-11-09 | Discharge: 2020-11-09 | Disposition: A | Discharge status: Home / Self Care | Payer: Medicare Other | Attending: Emergency Medicine | Admitting: Emergency Medicine

## 2020-11-09 DIAGNOSIS — I10 Essential (primary) hypertension: Secondary | ICD-10-CM | POA: Insufficient documentation

## 2020-11-09 DIAGNOSIS — Z743 Need for continuous supervision: Secondary | ICD-10-CM | POA: Diagnosis not present

## 2020-11-09 DIAGNOSIS — Z8719 Personal history of other diseases of the digestive system: Secondary | ICD-10-CM | POA: Insufficient documentation

## 2020-11-09 DIAGNOSIS — K7689 Other specified diseases of liver: Secondary | ICD-10-CM | POA: Diagnosis not present

## 2020-11-09 DIAGNOSIS — B9689 Other specified bacterial agents as the cause of diseases classified elsewhere: Secondary | ICD-10-CM | POA: Insufficient documentation

## 2020-11-09 DIAGNOSIS — Z79899 Other long term (current) drug therapy: Secondary | ICD-10-CM | POA: Insufficient documentation

## 2020-11-09 DIAGNOSIS — E1165 Type 2 diabetes mellitus with hyperglycemia: Secondary | ICD-10-CM | POA: Diagnosis not present

## 2020-11-09 DIAGNOSIS — D7289 Other specified disorders of white blood cells: Secondary | ICD-10-CM | POA: Diagnosis not present

## 2020-11-09 DIAGNOSIS — K449 Diaphragmatic hernia without obstruction or gangrene: Secondary | ICD-10-CM | POA: Diagnosis not present

## 2020-11-09 DIAGNOSIS — D3500 Benign neoplasm of unspecified adrenal gland: Secondary | ICD-10-CM | POA: Diagnosis not present

## 2020-11-09 DIAGNOSIS — R109 Unspecified abdominal pain: Secondary | ICD-10-CM

## 2020-11-09 DIAGNOSIS — N200 Calculus of kidney: Secondary | ICD-10-CM | POA: Diagnosis not present

## 2020-11-09 DIAGNOSIS — R350 Frequency of micturition: Secondary | ICD-10-CM | POA: Insufficient documentation

## 2020-11-09 LAB — CBC
HCT: 42.4 % (ref 36.0–46.0)
Hemoglobin: 14.1 g/dL (ref 12.0–15.0)
MCH: 31.8 pg (ref 26.0–34.0)
MCHC: 33.3 g/dL (ref 30.0–36.0)
MCV: 95.5 fL (ref 80.0–100.0)
Platelets: 84 10*3/uL — ABNORMAL LOW (ref 150–400)
RBC: 4.44 MIL/uL (ref 3.87–5.11)
RDW: 13.9 % (ref 11.5–15.5)
WBC: 8.3 10*3/uL (ref 4.0–10.5)
nRBC: 0 % (ref 0.0–0.2)

## 2020-11-09 LAB — COMPREHENSIVE METABOLIC PANEL
ALT: 14 U/L (ref 0–44)
AST: 20 U/L (ref 15–41)
Albumin: 4 g/dL (ref 3.5–5.0)
Alkaline Phosphatase: 63 U/L (ref 38–126)
Anion gap: 11 (ref 5–15)
BUN: 20 mg/dL (ref 8–23)
CO2: 24 mmol/L (ref 22–32)
Calcium: 9.5 mg/dL (ref 8.9–10.3)
Chloride: 101 mmol/L (ref 98–111)
Creatinine, Ser: 1.35 mg/dL — ABNORMAL HIGH (ref 0.44–1.00)
GFR, Estimated: 39 mL/min — ABNORMAL LOW (ref >60)
Glucose, Bld: 135 mg/dL — ABNORMAL HIGH (ref 70–99)
Potassium: 3.9 mmol/L (ref 3.5–5.1)
Sodium: 136 mmol/L (ref 135–145)
Total Bilirubin: 0.6 mg/dL (ref 0.3–1.2)
Total Protein: 7 g/dL (ref 6.5–8.1)

## 2020-11-09 LAB — URINALYSIS, ROUTINE W REFLEX MICROSCOPIC
Bilirubin Urine: NEGATIVE
Glucose, UA: NEGATIVE mg/dL
Ketones, ur: NEGATIVE mg/dL
Leukocytes,Ua: NEGATIVE
Nitrite: NEGATIVE
Protein, ur: NEGATIVE mg/dL
Specific Gravity, Urine: 1.015 (ref 1.005–1.030)
pH: 5 (ref 5.0–8.0)

## 2020-11-09 LAB — CBG MONITORING, ED: Glucose-Capillary: 115 mg/dL — ABNORMAL HIGH (ref 70–99)

## 2020-11-09 MED ORDER — SODIUM CHLORIDE 0.9 % IV BOLUS
500.0000 mL | Freq: Once | INTRAVENOUS | Status: AC
  Administered 2020-11-09: 500 mL via INTRAVENOUS

## 2020-11-09 MED ORDER — FENTANYL CITRATE (PF) 100 MCG/2ML IJ SOLN
25.0000 ug | Freq: Once | INTRAMUSCULAR | Status: AC
  Administered 2020-11-09: 25 ug via INTRAVENOUS
  Filled 2020-11-09: qty 2

## 2020-11-09 NOTE — ED Notes (Signed)
Pt transported to CT ?

## 2020-11-09 NOTE — Discharge Instructions (Signed)
Please recheck with your doctor this week Urine will be cultured and they will let you know if you need to start antibiotics Return to the emergency department if you are having any worsening symptoms.

## 2020-11-09 NOTE — ED Provider Notes (Signed)
[facility] EMERGENCY DEPARTMENT Provider Note   CSN: [id number] Arrival date & time: 11/09/20  3151     History No chief complaint on file.   Jasmin [name] is a 87 y.o. female.  HPI  87 year old female presents today complaining of right flank pain that began this morning.  It is sharp in nature with 8 out of 10 pain.  She endorses urinary frequency but does state that she gets up to go the bathroom 4-5 times every night.  She denies fever, chills, nausea, vomiting.  She had some chicken salad this morning it did not make the pain worse and she did not have any vomiting afterwards.  She denies any similar symptoms in the past, however review of records show that she presented with a similar complaint Dec 25, 2019.  No definitive diagnosis was found at that time.  She was followed up by urology and her primary care physician but I can find no definitive diagnosis linked to the event     Past Medical History:  Diagnosis Date  . Contact dermatitis and other eczema, due to unspecified cause   . Diverticulosis of colon   . Esophageal stricture   . GERD with stricture    Dr Henrene Pastor  . Gout   . Hemorrhoids   . Hiatal hernia   . Hyperlipidemia   . Hypertension   . Onychomycosis   . Personal history of urinary calculi   . Polycystic kidney   . Tubulovillous adenoma polyp of colon   . Unspecified vitamin D deficiency   . Vitamin B12 deficiency 2009   Borderline    Patient Active Problem List   Diagnosis Date Noted  . Nocturia 01/28/2020  . Flank pain 06/07/2019  . Bilateral primary osteoarthritis of knee 09/13/2018  . Constipation 07/06/2018  . Peripheral neuropathic pain 12/28/2017  . Gout 08/30/2017  . History of colonic polyps 08/13/2016  . Lipoma of arm 09/30/2014  . WBC decreased 05/22/2013  . Low back pain 11/16/2012  . Obesity 05/31/2012  . Dyspnea 03/10/2012  . Edema 03/10/2012  . Memory problem 11/24/2011  . Internal hemorrhoid 05/20/2011   . Well adult exam 11/17/2010  . ONYCHOMYCOSIS 06/10/2009  . DYSPHAGIA 09/10/2008  . B12 deficiency 04/15/2008  . ESOPHAGEAL STRICTURE 12/05/2007  . Hiatal hernia 12/05/2007  . FRACTURE, RIGHT LEG 12/05/2007  . NEPHROLITHIASIS, HX OF 12/05/2007  . PARESTHESIA 09/22/2007  . GERD 08/09/2007  . Vitamin D deficiency 03/20/2007  . Dyslipidemia 03/20/2007  . Essential hypertension 03/20/2007  . DIVERTICULOSIS, COLON 03/20/2007    Past Surgical History:  Procedure Laterality Date  . CATARACT EXTRACTION Bilateral 2017  . DILATION AND CURETTAGE OF UTERUS  1977  . TOE SURGERY  2012   Right foot   . TONSILLECTOMY     as child     OB History   No obstetric history on file.     Family History  Problem Relation Age of Onset  . Hypertension Father   . Colon cancer Neg Hx   . Esophageal cancer Neg Hx   . Rectal cancer Neg Hx   . Stomach cancer Neg Hx     Social History   Tobacco Use  . Smoking status: Never Smoker  . Smokeless tobacco: Never Used  Vaping Use  . Vaping Use: Never used  Substance Use Topics  . Alcohol use: No    Alcohol/week: 0.0 standard drinks  . Drug use: No    Home Medications Prior to Admission medications  Medication Sig Start Date End Date Taking? Authorizing Provider  acetaminophen (TYLENOL) 500 MG tablet Take 1 tablet (500 mg total) by mouth every 6 (six) hours as needed. 07/25/20   Zigmund Gottron, NP  Cholecalciferol 1000 units tablet Take 2 tablets (2,000 Units total) by mouth daily. 10/06/15   Plotnikov, Evie Lacks, MD  colchicine 0.6 MG tablet Take two tablets prn gout attack.Then take another one in 1-2 hrs. Do not repeat for 3 days. 03/08/19   Plotnikov, Evie Lacks, MD  diclofenac Sodium (VOLTAREN) 1 % GEL Apply 4 g topically 4 (four) times daily. 07/25/20   Zigmund Gottron, NP  famotidine (PEPCID) 40 MG tablet Take 1 tablet (40 mg total) by mouth daily. 01/28/20   Plotnikov, Evie Lacks, MD  gabapentin (NEURONTIN) 100 MG capsule Take 1 capsule  (100 mg total) by mouth at bedtime. 05/31/19   Hyatt, Max T, DPM  hydrocortisone (ANUSOL-HC) 25 MG suppository Place 1 suppository (25 mg total) rectally at bedtime as needed for hemorrhoids or anal itching. 12/23/18   Mauri Pole, MD  lovastatin (MEVACOR) 40 MG tablet Take 1 tablet (40 mg total) by mouth at bedtime. 01/28/20   Plotnikov, Evie Lacks, MD  metoprolol tartrate (LOPRESSOR) 50 MG tablet Take 1 tablet (50 mg total) by mouth 2 (two) times daily. 01/28/20   Plotnikov, Evie Lacks, MD  oxybutynin (DITROPAN) 5 MG tablet TAKE 1 TABLET(5 MG) BY MOUTH AT BEDTIME 07/21/20   Plotnikov, Evie Lacks, MD  polyethylene glycol powder (GLYCOLAX/MIRALAX) powder Take 17 g by mouth 2 (two) times daily as needed for mild constipation, moderate constipation or severe constipation. Patient taking differently: Take 17 g by mouth as needed for mild constipation, moderate constipation or severe constipation.  07/06/18   Plotnikov, Evie Lacks, MD  spironolactone (ALDACTONE) 50 MG tablet Take 1 tablet (50 mg total) by mouth daily. 01/28/20   Plotnikov, Evie Lacks, MD    Allergies    Allopurinol, Doxycycline, Hctz [hydrochlorothiazide], Omeprazole, Oxybutynin, Sulfonamide derivatives, Clarithromycin, and Penicillins  Review of Systems   Review of Systems  All other systems reviewed and are negative.   Physical Exam Updated Vital Signs Ht 1.702 m (5\' 7" )   Wt 98.9 kg   BMI 34.14 kg/m   Physical Exam Vitals and nursing note reviewed.  Constitutional:      General: She is not in acute distress.    Appearance: Normal appearance. She is not ill-appearing.  HENT:     Head: Normocephalic.     Right Ear: External ear normal.     Left Ear: External ear normal.     Nose: Nose normal.     Mouth/Throat:     Pharynx: Oropharynx is clear.  Eyes:     Extraocular Movements: Extraocular movements intact.     Pupils: Pupils are equal, round, and reactive to light.  Cardiovascular:     Rate and Rhythm: Normal rate.      Pulses: Normal pulses.  Pulmonary:     Effort: Pulmonary effort is normal.  Abdominal:     General: Abdomen is flat.     Palpations: Abdomen is soft.     Tenderness: There is abdominal tenderness.    Musculoskeletal:        General: No swelling or tenderness. Normal range of motion.     Cervical back: Normal range of motion.     Right lower leg: No edema.     Left lower leg: No edema.  Skin:    General: Skin is  warm and dry.     Capillary Refill: Capillary refill takes less than 2 seconds.  Neurological:     General: No focal deficit present.     Mental Status: She is alert.  Psychiatric:        Mood and Affect: Mood normal.     ED Results / Procedures / Treatments   Labs (all labs ordered are listed, but only abnormal results are displayed) Labs Reviewed  CBC  COMPREHENSIVE METABOLIC PANEL  URINALYSIS, ROUTINE W REFLEX MICROSCOPIC  CBG MONITORING, ED    EKG None  Radiology CT Renal Stone Study  Result Date: 11/09/2020 CLINICAL DATA:  Right flank pain.  History of kidney stones. EXAM: CT ABDOMEN AND PELVIS WITHOUT CONTRAST TECHNIQUE: Multidetector CT imaging of the abdomen and pelvis was performed following the standard protocol without IV contrast. COMPARISON:  CT abdomen pelvis 12/25/2019 FINDINGS: Lower chest: Small hiatal hernia. Coronary artery calcification. Lung bases are clear. Evaluation of the abdominal viscera limited by the lack of IV contrast. Hepatobiliary: Multiple large hepatic cyst, similar to prior. Gallbladder is unremarkable. No intra or extrahepatic biliary duct dilation. Pancreas: Unremarkable. No surrounding inflammatory changes. Spleen: Normal in size without focal abnormality. Adrenals/Urinary Tract: Stable small left adrenal adenoma. Normal right adrenal gland. Multiple bilateral parapelvic cysts. There is no hydronephrosis. There is a small exophytic cyst in the left kidney, stable dating back to 2012. There is a punctate calculus in the right  kidney. No left renal calculi. Urinary bladder is unremarkable. Stomach/Bowel: Stomach is within normal limits. Appendix appears normal. No evidence of bowel wall thickening, distention, or inflammatory changes. Multiple colonic diverticula without evidence of diverticulitis. Vascular/Lymphatic: Aortic atherosclerosis. No enlarged abdominal or pelvic lymph nodes. Reproductive: Uterus and bilateral adnexa are unremarkable. The the Other: No abdominal wall hernia or abnormality. No abdominopelvic ascites. Musculoskeletal: Degenerative changes in the lumbar spine. IMPRESSION: 1. No acute intra-abdominal or intrapelvic pathology on a noncontrast exam. No hydronephrosis or obstructing stone. 2. Punctate nonobstructing right renal calculus. 3. Colonic diverticulosis without evidence of diverticulitis. 4. Aortic atherosclerosis. Aortic Atherosclerosis (ICD10-I70.0). Electronically Signed   By: Audie Pinto M.D.   On: 11/09/2020 10:48    Procedures Procedures   Medications Ordered in ED Medications  sodium chloride 0.9 % bolus 500 mL (has no administration in time range)  fentaNYL (SUBLIMAZE) injection 25 mcg (has no administration in time range)    ED Course  I have reviewed the triage vital signs and the nursing notes.  Pertinent labs & imaging results that were available during my care of the patient were reviewed by me and considered in my medical decision making (see chart for details).    MDM Rules/Calculators/A&P                          87 year old female presents today complaining of some right flank pain.  Evaluation here is shows no clear etiology.  Urinalysis reveals rare bacteria and 6-10 white blood cells as well as squamous epithelial cells.  She has had some frequency of urination.  Plan culture of urine.  I she is feeling better after fentanyl.  She appears stable for discharge Final Clinical Impression(s) / ED Diagnoses Final diagnoses:  Right flank pain    Rx / DC  Orders ED Discharge Orders    None       Pattricia Boss, MD 11/09/20 1428

## 2020-11-09 NOTE — ED Notes (Signed)
Pt d/c home per MD order. Discharge summary reviewed with pt, pt verbalizes understanding. No s/s of acute distress noted at discharge. Pt husband is discharge ride home.  

## 2020-11-09 NOTE — ED Triage Notes (Signed)
Pt to ED via [facility]  From home c/o RUQ Pain that started this morning rates 8/10. No pain with palpation, pain does not radiate, No N/V. No medications given by EMS. cbg 395 Med hx: HTN, Last VS 160/84, hr 75, rr22, 95%RA, TEMP 98

## 2020-11-10 ENCOUNTER — Other Ambulatory Visit: Payer: Self-pay

## 2020-11-10 ENCOUNTER — Encounter: Payer: Self-pay | Admitting: Internal Medicine

## 2020-11-10 ENCOUNTER — Ambulatory Visit (INDEPENDENT_AMBULATORY_CARE_PROVIDER_SITE_OTHER): Payer: Medicare Other | Admitting: Internal Medicine

## 2020-11-10 VITALS — BP 138/80 | HR 81 | Temp 98.2°F | Resp 18 | Ht 67.0 in | Wt 219.2 lb

## 2020-11-10 DIAGNOSIS — R1031 Right lower quadrant pain: Secondary | ICD-10-CM

## 2020-11-10 DIAGNOSIS — I700 Atherosclerosis of aorta: Secondary | ICD-10-CM | POA: Diagnosis not present

## 2020-11-10 LAB — POCT URINALYSIS DIPSTICK
Bilirubin, UA: NEGATIVE
Blood, UA: NEGATIVE
Glucose, UA: NEGATIVE
Ketones, UA: NEGATIVE
Nitrite, UA: NEGATIVE
Protein, UA: NEGATIVE
Spec Grav, UA: 1.015 (ref 1.010–1.025)
Urobilinogen, UA: 0.2 E.U./dL
pH, UA: 6 (ref 5.0–8.0)

## 2020-11-10 LAB — URINE CULTURE

## 2020-11-10 MED ORDER — NITROFURANTOIN MONOHYD MACRO 100 MG PO CAPS
100.0000 mg | ORAL_CAPSULE | Freq: Two times a day (BID) | ORAL | 0 refills | Status: DC
Start: 2020-11-10 — End: 2021-01-27

## 2020-11-10 NOTE — Assessment & Plan Note (Signed)
On lovastatin 40 mg and noted on recent CT abdomen.

## 2020-11-10 NOTE — Patient Instructions (Addendum)
We have sent in macrobid to take 1 pill twice a day for 1 week to clear the infection.  Keep taking tylenol for pain as needed.

## 2020-11-10 NOTE — Progress Notes (Signed)
   Subjective:   Patient ID: Jasmin [name], female    DOB: [birth year], 87 y.o.   MRN: [id number]  HPI The patient is an 87 YO female coming in for ER follow up (in for right sided abdominal pain, started Sunday when getting ready for church, denies fevers or chills). She denies diarrhea or constipation or blood in stool. Denies nausea or vomiting. Still eating normal and drinking liquids. Denies change in pain with eating. Denies blood in urine. In the last few days she has noticed more urgency with urinating and not being able to hold this as well once she gets to the toilet. Denies burning with urination. She did have pain medication and CT scan and labs at the ER. She was not given treatment. Took tylenol that evening at home and in the night. Has not taken any tylenol this morning. Overall that helped well. With bending or twisting the pain is worse. Overall not improving or worsening since yesterday. Denies radiation of the pain.  PMH, [facility], social history reviewed and updated.   Review of Systems  Constitutional: Negative.   HENT: Negative.   Eyes: Negative.   Respiratory: Negative for cough, chest tightness and shortness of breath.   Cardiovascular: Negative for chest pain, palpitations and leg swelling.  Gastrointestinal: Positive for abdominal pain. Negative for abdominal distention, anal bleeding, blood in stool, constipation, diarrhea, nausea, rectal pain and vomiting.  Genitourinary: Positive for urgency.  Musculoskeletal: Negative.   Skin: Negative.   Neurological: Negative.   Psychiatric/Behavioral: Negative.     Objective:  Physical Exam Constitutional:      Appearance: She is well-developed. She is obese.  HENT:     Head: Normocephalic and atraumatic.  Cardiovascular:     Rate and Rhythm: Normal rate and regular rhythm.  Pulmonary:     Effort: Pulmonary effort is normal. No respiratory distress.     Breath sounds: Normal breath sounds. No wheezing or rales.   Abdominal:     General: Bowel sounds are normal. There is no distension.     Palpations: Abdomen is soft.     Tenderness: There is abdominal tenderness. There is no rebound.     Comments: No guarding or rebound tenderness, pain localized to the RLQ and no diffuse tenderness in the abdomen.  Musculoskeletal:     Cervical back: Normal range of motion.  Skin:    General: Skin is warm and dry.  Neurological:     Mental Status: She is alert and oriented to person, place, and time.     Coordination: Coordination normal.     Vitals:   11/10/20 1032  BP: 138/80  Pulse: 81  Resp: 18  Temp: 98.2 F (36.8 C)  TempSrc: Oral  SpO2: 100%  Weight: 219 lb 3.2 oz (99.4 kg)  Height: 5\' 7"  (1.702 m)    This visit occurred during the SARS-CoV-2 public health emergency.  Safety protocols were in place, including screening questions prior to the visit, additional usage of staff PPE, and extensive cleaning of exam room while observing appropriate contact time as indicated for disinfecting solutions.   Assessment & Plan:  Visit time 25 minutes in face to face communication with patient and coordination of care, additional 5 minutes spent in record review, coordination or care, ordering tests, communicating/referring to other healthcare professionals, documenting in medical records all on the same day of the visit for total time 30 minutes spent on the visit.

## 2020-11-10 NOTE — Assessment & Plan Note (Addendum)
No new findings on CT abdomen. Labs overall reassuring. Some mixed evidence for UTI on U/A so this was repeated in office today with signs of infection. Rx macrobid 1 week and await urine culture results from ER. Adjust as needed. She does have hepatic and parapelvic cysts unchanged on recent CT.

## 2020-11-11 ENCOUNTER — Ambulatory Visit: Payer: Medicare Other | Admitting: Internal Medicine

## 2020-11-20 ENCOUNTER — Telehealth: Payer: Self-pay | Admitting: Internal Medicine

## 2020-11-20 DIAGNOSIS — R300 Dysuria: Secondary | ICD-10-CM

## 2020-11-20 NOTE — Telephone Encounter (Signed)
Orders placed to recheck urine to see if infection is still present.

## 2020-11-20 NOTE — Telephone Encounter (Signed)
Patient called and said that she finished taking nitrofurantoin, macrocrystal-monohydrate, (MACROBID) 100 MG capsule. She said last night her side started to hurt again and she was wondering if something else may need to be called. She can be reached at [phone]. She seen Dr. Sharlet Salina on 11-10-20. Please advise

## 2020-11-20 NOTE — Telephone Encounter (Signed)
See below

## 2020-11-21 NOTE — Telephone Encounter (Signed)
Unable to get in contact with the patient. LDVM asking the patient to give our office a call back. Office number was provided.

## 2020-11-24 ENCOUNTER — Other Ambulatory Visit: Payer: Self-pay

## 2020-11-24 ENCOUNTER — Ambulatory Visit (HOSPITAL_COMMUNITY)
Admission: EM | Admit: 2020-11-24 | Discharge: 2020-11-24 | Disposition: A | Discharge status: Home / Self Care | Payer: Medicare Other | Attending: Emergency Medicine | Admitting: Emergency Medicine

## 2020-11-24 ENCOUNTER — Encounter (HOSPITAL_COMMUNITY): Payer: Self-pay

## 2020-11-24 DIAGNOSIS — R040 Epistaxis: Secondary | ICD-10-CM

## 2020-11-24 MED ORDER — AFRIN NASAL SPRAY 0.05 % NA SOLN
1.0000 | Freq: Two times a day (BID) | NASAL | 0 refills | Status: DC
Start: 2020-11-24 — End: 2021-02-18

## 2020-11-24 NOTE — ED Provider Notes (Signed)
[place]    CSN: [id number] Arrival date & time: 11/24/20  1643      History   Chief Complaint Chief Complaint  Patient presents with  . Epistaxis    HPI Jasmin [name] is a 87 y.o. female.   Patient presents with left nasal nose bleed beginning earlier this afternoon, has resolved after pressure being applied. Isolated event. Denies precipitating event, trauma or injury, pain to nostril, shortness of breath, difficulty breathing, headaches, cough. No history of frequent nose bloods. Denies allergies, clotting disorder.   Past Medical History:  Diagnosis Date  . Contact dermatitis and other eczema, due to unspecified cause   . Diverticulosis of colon   . Esophageal stricture   . GERD with stricture    Dr Henrene Pastor  . Gout   . Hemorrhoids   . Hiatal hernia   . Hyperlipidemia   . Hypertension   . Onychomycosis   . Personal history of urinary calculi   . Polycystic kidney   . Tubulovillous adenoma polyp of colon   . Unspecified vitamin D deficiency   . Vitamin B12 deficiency 2009   Borderline    Patient Active Problem List   Diagnosis Date Noted  . Aortic atherosclerosis ([place]) 11/10/2020  . Nocturia 01/28/2020  . RLQ abdominal pain 06/07/2019  . Bilateral primary osteoarthritis of knee 09/13/2018  . Constipation 07/06/2018  . Peripheral neuropathic pain 12/28/2017  . Gout 08/30/2017  . History of colonic polyps 08/13/2016  . Lipoma of arm 09/30/2014  . WBC decreased 05/22/2013  . Low back pain 11/16/2012  . Obesity 05/31/2012  . Dyspnea 03/10/2012  . Edema 03/10/2012  . Memory problem 11/24/2011  . Internal hemorrhoid 05/20/2011  . Well adult exam 11/17/2010  . ONYCHOMYCOSIS 06/10/2009  . DYSPHAGIA 09/10/2008  . B12 deficiency 04/15/2008  . ESOPHAGEAL STRICTURE 12/05/2007  . Hiatal hernia 12/05/2007  . FRACTURE, RIGHT LEG 12/05/2007  . NEPHROLITHIASIS, HX OF 12/05/2007  . PARESTHESIA 09/22/2007  . GERD 08/09/2007  . Vitamin D deficiency  03/20/2007  . Dyslipidemia 03/20/2007  . Essential hypertension 03/20/2007  . DIVERTICULOSIS, COLON 03/20/2007    Past Surgical History:  Procedure Laterality Date  . CATARACT EXTRACTION Bilateral 2017  . DILATION AND CURETTAGE OF UTERUS  1977  . TOE SURGERY  2012   Right foot   . TONSILLECTOMY     as child    OB History   No obstetric history on file.      Home Medications    Prior to Admission medications   Medication Sig Start Date End Date Taking? Authorizing Provider  oxymetazoline (AFRIN NASAL SPRAY) 0.05 % nasal spray Place 1 spray into both nostrils 2 (two) times daily. 11/24/20  Yes Koral Thaden, Leitha Schuller, NP  acetaminophen (TYLENOL) 500 MG tablet Take 1 tablet (500 mg total) by mouth every 6 (six) hours as needed. 07/25/20   Zigmund Gottron, NP  Cholecalciferol 1000 units tablet Take 2 tablets (2,000 Units total) by mouth daily. 10/06/15   Plotnikov, Evie Lacks, MD  colchicine 0.6 MG tablet Take two tablets prn gout attack.Then take another one in 1-2 hrs. Do not repeat for 3 days. 03/08/19   Plotnikov, Evie Lacks, MD  diclofenac Sodium (VOLTAREN) 1 % GEL Apply 4 g topically 4 (four) times daily. 07/25/20   Zigmund Gottron, NP  famotidine (PEPCID) 40 MG tablet Take 1 tablet (40 mg total) by mouth daily. 01/28/20   Plotnikov, Evie Lacks, MD  gabapentin (NEURONTIN) 100 MG capsule Take 1  capsule (100 mg total) by mouth at bedtime. 05/31/19   Hyatt, Max T, DPM  hydrocortisone (ANUSOL-HC) 25 MG suppository Place 1 suppository (25 mg total) rectally at bedtime as needed for hemorrhoids or anal itching. 12/23/18   Mauri Pole, MD  lovastatin (MEVACOR) 40 MG tablet Take 1 tablet (40 mg total) by mouth at bedtime. 01/28/20   Plotnikov, Evie Lacks, MD  metoprolol tartrate (LOPRESSOR) 50 MG tablet Take 1 tablet (50 mg total) by mouth 2 (two) times daily. 01/28/20   Plotnikov, Evie Lacks, MD  nitrofurantoin, macrocrystal-monohydrate, (MACROBID) 100 MG capsule Take 1 capsule (100 mg total) by mouth  2 (two) times daily. 11/10/20   Hoyt Koch, MD  oxybutynin (DITROPAN) 5 MG tablet TAKE 1 TABLET(5 MG) BY MOUTH AT BEDTIME 07/21/20   Plotnikov, Evie Lacks, MD  polyethylene glycol powder (GLYCOLAX/MIRALAX) powder Take 17 g by mouth 2 (two) times daily as needed for mild constipation, moderate constipation or severe constipation. Patient taking differently: Take 17 g by mouth as needed for mild constipation, moderate constipation or severe constipation. 07/06/18   Plotnikov, Evie Lacks, MD  spironolactone (ALDACTONE) 50 MG tablet Take 1 tablet (50 mg total) by mouth daily. 01/28/20   Plotnikov, Evie Lacks, MD    Family History Family History  Problem Relation Age of Onset  . Hypertension Father   . Colon cancer Neg Hx   . Esophageal cancer Neg Hx   . Rectal cancer Neg Hx   . Stomach cancer Neg Hx     Social History Social History   Tobacco Use  . Smoking status: Never Smoker  . Smokeless tobacco: Never Used  Vaping Use  . Vaping Use: Never used  Substance Use Topics  . Alcohol use: No    Alcohol/week: 0.0 standard drinks  . Drug use: No     Allergies   Allopurinol, Doxycycline, Hctz [hydrochlorothiazide], Omeprazole, Oxybutynin, Sulfonamide derivatives, Clarithromycin, and Penicillins   Review of Systems Review of Systems  Constitutional: Negative.   HENT: Positive for nosebleeds. Negative for congestion, dental problem, drooling, ear discharge, ear pain, facial swelling, hearing loss, mouth sores, postnasal drip, rhinorrhea, sinus pressure, sinus pain, sneezing, sore throat, tinnitus, trouble swallowing and voice change.   Respiratory: Negative.   Cardiovascular: Negative.   Allergic/Immunologic: Negative.   Neurological: Negative.   Hematological: Negative.      Physical Exam Triage Vital Signs ED Triage Vitals  Enc Vitals Group     BP 11/24/20 1829 (!) 153/90     Pulse Rate 11/24/20 1829 63     Resp 11/24/20 1829 18     Temp 11/24/20 1829 98.2 F (36.8  C)     Temp Source 11/24/20 1829 Oral     SpO2 11/24/20 1829 96 %     Weight --      Height --      Head Circumference --      Peak Flow --      Pain Score 11/24/20 1828 0     Pain Loc --      Pain Edu? --      Excl. in [place]? --    No data found.  Updated Vital Signs BP (!) 153/90 (BP Location: Left Arm)   Pulse 63   Temp 98.2 F (36.8 C) (Oral)   Resp 18   SpO2 96%   Visual Acuity Right Eye Distance:   Left Eye Distance:   Bilateral Distance:    Right Eye Near:   Left Eye Near:  Bilateral Near:     Physical Exam Constitutional:      Appearance: Normal appearance. She is normal weight.  HENT:     Head: Normocephalic.     Nose: No nasal deformity, signs of injury, nasal tenderness, congestion or rhinorrhea.     Left Nostril: No foreign body.     Comments: Dried blood and clot present in left nare  Eyes:     Extraocular Movements: Extraocular movements intact.  Pulmonary:     Effort: Pulmonary effort is normal.  Musculoskeletal:        General: Normal range of motion.  Skin:    General: Skin is warm and dry.  Neurological:     General: No focal deficit present.     Mental Status: She is alert and oriented to person, place, and time. Mental status is at baseline.  Psychiatric:        Mood and Affect: Mood normal.        Behavior: Behavior normal.        Thought Content: Thought content normal.        Judgment: Judgment normal.      UC Treatments / Results  Labs (all labs ordered are listed, but only abnormal results are displayed) Labs Reviewed - No data to display  EKG   Radiology No results found.  Procedures Procedures (including critical care time)  Medications Ordered in UC Medications - No data to display  Initial Impression / Assessment and Plan / UC Course  I have reviewed the triage vital signs and the nursing notes.  Pertinent labs & imaging results that were available during my care of the patient were reviewed by me and  considered in my medical decision making (see chart for details).  Left sided epistaxis  1. Afrin .05% 2 sprays as needed for nosebleed, instructed on how to use medication and when to be evaluated, verbalized understanding 2. Advised use of vaseline or similar product to moisten nares with q-tip  3. Advised against removing clot, picking at nare due to potential of reoccurring bleeding.  Final Clinical Impressions(s) / UC Diagnoses   Final diagnoses:  Left-sided epistaxis     Discharge Instructions     Keep nose moistened with vaseline or similar product, apply with qtip  Do not attempt to pick dried blood out of nose, may cause nose to bleed again   Can use nasal spray as needed for nose bleed. While nose is bleeding, blow nose to clear out passageway, spray 2 pumps into nare, apply pressure for 10-15 minutes, if nose continues to bleed come into urgent care for evaluation    ED Prescriptions    Medication Sig Dispense Auth. Provider   oxymetazoline (AFRIN NASAL SPRAY) 0.05 % nasal spray Place 1 spray into both nostrils 2 (two) times daily. 30 mL Hans Eden, NP     PDMP not reviewed this encounter.   Hans Eden, NP 11/24/20 1850

## 2020-11-24 NOTE — ED Triage Notes (Signed)
Pt reports nosebleed form left nostril since this morning. No active bleeding at this moment. Denies headaches, dizziness.   Pt was told by a nurse over the phone to follow up with PCP; PCP is not in the office.

## 2020-11-24 NOTE — Discharge Instructions (Signed)
Keep nose moistened with vaseline or similar product, apply with qtip  Do not attempt to pick dried blood out of nose, may cause nose to bleed again   Can use nasal spray as needed for nose bleed. While nose is bleeding, blow nose to clear out passageway, spray 2 pumps into nare, apply pressure for 10-15 minutes, if nose continues to bleed come into urgent care for evaluation

## 2020-11-28 ENCOUNTER — Other Ambulatory Visit: Payer: Self-pay

## 2020-11-28 ENCOUNTER — Ambulatory Visit (HOSPITAL_COMMUNITY)
Admission: EM | Admit: 2020-11-28 | Discharge: 2020-11-28 | Disposition: A | Discharge status: Home / Self Care | Payer: Medicare Other

## 2020-11-28 ENCOUNTER — Encounter (HOSPITAL_COMMUNITY): Payer: Self-pay | Admitting: Emergency Medicine

## 2020-11-28 DIAGNOSIS — R040 Epistaxis: Secondary | ICD-10-CM | POA: Diagnosis not present

## 2020-11-28 NOTE — Discharge Instructions (Addendum)
-  Continue applying pressure and using Afrin during episodes of nosebleeds. -If your symptoms persist, follow-up with an ear nose and throat doctor.  Information below, or your primary care can send referral. -If you develop a nosebleed that will not go away despite treatment, had to the emergency room.

## 2020-11-28 NOTE — ED Provider Notes (Signed)
[place]    CSN: [id number] Arrival date & time: 11/28/20  5885      History   Chief Complaint Chief Complaint  Patient presents with  . Epistaxis    HPI Jasmin [name] is a 87 y.o. female presenting with continued nosebleeds.  Was last seen for this 5 days ago and was told to use Afrin which she has been using with relief.  Medical history of hypertension, hyperlipidemia, GERD.  She states that she has been using her Afrin nasal spray for her nosebleeds and it is stopping them.  Also using Vaseline at night using a Q-tip.  States she is concerned that her nosebleeds have persisted though they are controlled on this regimen.  Denies allergic rhinitis component or blowing nose. Does not take a blood thinner or daily aspirin.  HPI  Past Medical History:  Diagnosis Date  . Contact dermatitis and other eczema, due to unspecified cause   . Diverticulosis of colon   . Esophageal stricture   . GERD with stricture    Dr Henrene Pastor  . Gout   . Hemorrhoids   . Hiatal hernia   . Hyperlipidemia   . Hypertension   . Onychomycosis   . Personal history of urinary calculi   . Polycystic kidney   . Tubulovillous adenoma polyp of colon   . Unspecified vitamin D deficiency   . Vitamin B12 deficiency 2009   Borderline    Patient Active Problem List   Diagnosis Date Noted  . Aortic atherosclerosis ([place]) 11/10/2020  . Nocturia 01/28/2020  . RLQ abdominal pain 06/07/2019  . Bilateral primary osteoarthritis of knee 09/13/2018  . Constipation 07/06/2018  . Peripheral neuropathic pain 12/28/2017  . Gout 08/30/2017  . History of colonic polyps 08/13/2016  . Lipoma of arm 09/30/2014  . WBC decreased 05/22/2013  . Low back pain 11/16/2012  . Obesity 05/31/2012  . Dyspnea 03/10/2012  . Edema 03/10/2012  . Memory problem 11/24/2011  . Internal hemorrhoid 05/20/2011  . Well adult exam 11/17/2010  . ONYCHOMYCOSIS 06/10/2009  . DYSPHAGIA 09/10/2008  . B12 deficiency  04/15/2008  . ESOPHAGEAL STRICTURE 12/05/2007  . Hiatal hernia 12/05/2007  . FRACTURE, RIGHT LEG 12/05/2007  . NEPHROLITHIASIS, HX OF 12/05/2007  . PARESTHESIA 09/22/2007  . GERD 08/09/2007  . Vitamin D deficiency 03/20/2007  . Dyslipidemia 03/20/2007  . Essential hypertension 03/20/2007  . DIVERTICULOSIS, COLON 03/20/2007    Past Surgical History:  Procedure Laterality Date  . CATARACT EXTRACTION Bilateral 2017  . DILATION AND CURETTAGE OF UTERUS  1977  . TOE SURGERY  2012   Right foot   . TONSILLECTOMY     as child    OB History   No obstetric history on file.      Home Medications    Prior to Admission medications   Medication Sig Start Date End Date Taking? Authorizing Provider  acetaminophen (TYLENOL) 500 MG tablet Take 1 tablet (500 mg total) by mouth every 6 (six) hours as needed. 07/25/20   Zigmund Gottron, NP  Cholecalciferol 1000 units tablet Take 2 tablets (2,000 Units total) by mouth daily. 10/06/15   Plotnikov, Evie Lacks, MD  colchicine 0.6 MG tablet Take two tablets prn gout attack.Then take another one in 1-2 hrs. Do not repeat for 3 days. 03/08/19   Plotnikov, Evie Lacks, MD  diclofenac Sodium (VOLTAREN) 1 % GEL Apply 4 g topically 4 (four) times daily. 07/25/20   Zigmund Gottron, NP  famotidine (PEPCID) 40 MG tablet  Take 1 tablet (40 mg total) by mouth daily. 01/28/20   Plotnikov, Evie Lacks, MD  gabapentin (NEURONTIN) 100 MG capsule Take 1 capsule (100 mg total) by mouth at bedtime. 05/31/19   Hyatt, Max T, DPM  hydrocortisone (ANUSOL-HC) 25 MG suppository Place 1 suppository (25 mg total) rectally at bedtime as needed for hemorrhoids or anal itching. 12/23/18   Mauri Pole, MD  lovastatin (MEVACOR) 40 MG tablet Take 1 tablet (40 mg total) by mouth at bedtime. 01/28/20   Plotnikov, Evie Lacks, MD  metoprolol tartrate (LOPRESSOR) 50 MG tablet Take 1 tablet (50 mg total) by mouth 2 (two) times daily. 01/28/20   Plotnikov, Evie Lacks, MD  nitrofurantoin,  macrocrystal-monohydrate, (MACROBID) 100 MG capsule Take 1 capsule (100 mg total) by mouth 2 (two) times daily. 11/10/20   Hoyt Koch, MD  oxybutynin (DITROPAN) 5 MG tablet TAKE 1 TABLET(5 MG) BY MOUTH AT BEDTIME 07/21/20   Plotnikov, Evie Lacks, MD  oxymetazoline (AFRIN NASAL SPRAY) 0.05 % nasal spray Place 1 spray into both nostrils 2 (two) times daily. 11/24/20   White, Leitha Schuller, NP  polyethylene glycol powder (GLYCOLAX/MIRALAX) powder Take 17 g by mouth 2 (two) times daily as needed for mild constipation, moderate constipation or severe constipation. Patient taking differently: Take 17 g by mouth as needed for mild constipation, moderate constipation or severe constipation. 07/06/18   Plotnikov, Evie Lacks, MD  spironolactone (ALDACTONE) 50 MG tablet Take 1 tablet (50 mg total) by mouth daily. 01/28/20   Plotnikov, Evie Lacks, MD    Family History Family History  Problem Relation Age of Onset  . Hypertension Father   . Colon cancer Neg Hx   . Esophageal cancer Neg Hx   . Rectal cancer Neg Hx   . Stomach cancer Neg Hx     Social History Social History   Tobacco Use  . Smoking status: Never Smoker  . Smokeless tobacco: Never Used  Vaping Use  . Vaping Use: Never used  Substance Use Topics  . Alcohol use: No    Alcohol/week: 0.0 standard drinks  . Drug use: No     Allergies   Allopurinol, Doxycycline, Hctz [hydrochlorothiazide], Omeprazole, Oxybutynin, Sulfonamide derivatives, Clarithromycin, and Penicillins   Review of Systems Review of Systems  Constitutional: Negative for appetite change, chills and fever.  HENT: Positive for nosebleeds. Negative for congestion, ear pain, rhinorrhea, sinus pressure, sinus pain and sore throat.   Eyes: Negative for redness and visual disturbance.  Respiratory: Negative for cough, chest tightness, shortness of breath and wheezing.   Cardiovascular: Negative for chest pain and palpitations.  Gastrointestinal: Negative for abdominal  pain, constipation, diarrhea, nausea and vomiting.  Genitourinary: Negative for dysuria, frequency and urgency.  Musculoskeletal: Negative for myalgias.  Neurological: Negative for dizziness, weakness and headaches.  Psychiatric/Behavioral: Negative for confusion.  All other systems reviewed and are negative.    Physical Exam Triage Vital Signs ED Triage Vitals  Enc Vitals Group     BP 11/28/20 1030 (!) 143/68     Pulse Rate 11/28/20 1030 67     Resp 11/28/20 1030 20     Temp 11/28/20 1030 97.9 F (36.6 C)     Temp Source 11/28/20 1030 Oral     SpO2 11/28/20 1030 97 %     Weight --      Height --      Head Circumference --      Peak Flow --      Pain Score 11/28/20 1032 0  Pain Loc --      Pain Edu? --      Excl. in [place]? --    No data found.  Updated Vital Signs BP (!) 143/68 (BP Location: Right Arm)   Pulse 67   Temp 97.9 F (36.6 C) (Oral)   Resp 20   SpO2 97%   Visual Acuity Right Eye Distance:   Left Eye Distance:   Bilateral Distance:    Right Eye Near:   Left Eye Near:    Bilateral Near:     Physical Exam Vitals reviewed.  Constitutional:      General: She is not in acute distress.    Appearance: Normal appearance. She is not ill-appearing.  HENT:     Head: Normocephalic and atraumatic.     Right Ear: Hearing, tympanic membrane, ear canal and external ear normal. No swelling or tenderness. There is no impacted cerumen. No mastoid tenderness. Tympanic membrane is not perforated, erythematous, retracted or bulging.     Left Ear: Hearing, tympanic membrane, ear canal and external ear normal. No swelling or tenderness. There is no impacted cerumen. No mastoid tenderness. Tympanic membrane is not perforated, erythematous, retracted or bulging.     Nose:     Right Nostril: No foreign body, epistaxis, septal hematoma or occlusion.     Left Nostril: No foreign body, epistaxis, septal hematoma or occlusion.     Right Sinus: No maxillary sinus tenderness or  frontal sinus tenderness.     Left Sinus: No maxillary sinus tenderness or frontal sinus tenderness.     Comments: Dried blood visible inside L nare    Mouth/Throat:     Mouth: Mucous membranes are moist.     Pharynx: Uvula midline. No oropharyngeal exudate or posterior oropharyngeal erythema.     Tonsils: No tonsillar exudate.  Cardiovascular:     Rate and Rhythm: Normal rate and regular rhythm.     Heart sounds: Normal heart sounds.  Pulmonary:     Breath sounds: Normal breath sounds and air entry. No wheezing, rhonchi or rales.  Chest:     Chest wall: No tenderness.  Abdominal:     General: Abdomen is flat. Bowel sounds are normal.     Tenderness: There is no abdominal tenderness. There is no guarding or rebound.  Lymphadenopathy:     Cervical: No cervical adenopathy.  Neurological:     General: No focal deficit present.     Mental Status: She is alert and oriented to person, place, and time.  Psychiatric:        Attention and Perception: Attention and perception normal.        Mood and Affect: Mood and affect normal.        Behavior: Behavior normal. Behavior is cooperative.        Thought Content: Thought content normal.        Judgment: Judgment normal.      UC Treatments / Results  Labs (all labs ordered are listed, but only abnormal results are displayed) Labs Reviewed - No data to display  EKG   Radiology No results found.  Procedures Procedures (including critical care time)  Medications Ordered in UC Medications - No data to display  Initial Impression / Assessment and Plan / UC Course  I have reviewed the triage vital signs and the nursing notes.  Pertinent labs & imaging results that were available during my care of the patient were reviewed by me and considered in my medical decision making (see chart  for details).     This patient is an 87 year old female presenting with nosebleeds that are currently fairly well controlled on current regimen of  Afrin and Vaseline.  She does not take a blood thinner or daily aspirin.  No current nosebleed.  Follow-up with ENT if symptoms persist, ED return precautions discussed.  Final Clinical Impressions(s) / UC Diagnoses   Final diagnoses:  Left-sided epistaxis     Discharge Instructions     -Continue applying pressure and using Afrin during episodes of nosebleeds. -If your symptoms persist, follow-up with an ear nose and throat doctor.  Information below, or your primary care can send referral. -If you develop a nosebleed that will not go away despite treatment, had to the emergency room.    ED Prescriptions    None     PDMP not reviewed this encounter.   Hazel Sams, PA-C 11/28/20 1131

## 2020-11-28 NOTE — ED Triage Notes (Signed)
Pt presents today with c/o of continued nose bleeds. She reports that she was seen here on Monday for same. She has been using Afrin Nose spray as directed and was told to return to UC if nose bleed continues. Bleeding is controlled at this time.

## 2020-12-03 ENCOUNTER — Other Ambulatory Visit: Payer: Self-pay

## 2020-12-03 ENCOUNTER — Ambulatory Visit (INDEPENDENT_AMBULATORY_CARE_PROVIDER_SITE_OTHER): Payer: Medicare Other | Admitting: Otolaryngology

## 2020-12-03 ENCOUNTER — Encounter (INDEPENDENT_AMBULATORY_CARE_PROVIDER_SITE_OTHER): Payer: Self-pay | Admitting: Otolaryngology

## 2020-12-03 VITALS — Temp 97.3°F

## 2020-12-03 DIAGNOSIS — R040 Epistaxis: Secondary | ICD-10-CM

## 2020-12-03 NOTE — Progress Notes (Signed)
HPI: Jasmin [name] is a 87 y.o. female who presents is referred by her PCP for evaluation of recurrent left-sided epistaxis that initially began on April 4.  She has had several nosebleeds since that time.  She was seen by her PCP who prescribed Afrin as well as Vaseline  placed in the nose to help diminish further nosebleeds.  However she continued to have nosebleeds and was referred here a couple of days ago.  She had another nosebleed this morning. She is on no blood thinners but does have history of hypertension..  Past Medical History:  Diagnosis Date  . Contact dermatitis and other eczema, due to unspecified cause   . Diverticulosis of colon   . Esophageal stricture   . GERD with stricture    Dr Henrene Pastor  . Gout   . Hemorrhoids   . Hiatal hernia   . Hyperlipidemia   . Hypertension   . Onychomycosis   . Personal history of urinary calculi   . Polycystic kidney   . Tubulovillous adenoma polyp of colon   . Unspecified vitamin D deficiency   . Vitamin B12 deficiency 2009   Borderline   Past Surgical History:  Procedure Laterality Date  . CATARACT EXTRACTION Bilateral 2017  . DILATION AND CURETTAGE OF UTERUS  1977  . TOE SURGERY  2012   Right foot   . TONSILLECTOMY     as child   Social History   Socioeconomic History  . Marital status: Married    Spouse name: Not on file  . Number of children: 2  . Years of education: Not on file  . Highest education level: Not on file  Occupational History  . Occupation: RETIRED  Tobacco Use  . Smoking status: Never Smoker  . Smokeless tobacco: Never Used  Vaping Use  . Vaping Use: Never used  Substance and Sexual Activity  . Alcohol use: No    Alcohol/week: 0.0 standard drinks  . Drug use: No  . Sexual activity: Not on file  Other Topics Concern  . Not on file  Social History Narrative   GYN: Dr Philis Pique      FAMILY HISTORY HYPERTENSION      Regular exercise - NO   Social Determinants of Health   Financial  Resource Strain: Low Risk   . Difficulty of Paying Living Expenses: Not hard at all  Food Insecurity: No Food Insecurity  . Worried About Charity fundraiser in the Last Year: Never true  . Ran Out of Food in the Last Year: Never true  Transportation Needs: No Transportation Needs  . Lack of Transportation (Medical): No  . Lack of Transportation (Non-Medical): No  Physical Activity: Sufficiently Active  . Days of Exercise per Week: 5 days  . Minutes of Exercise per Session: 30 min  Stress: No Stress Concern Present  . Feeling of Stress : Not at all  Social Connections: Socially Integrated  . Frequency of Communication with Friends and Family: More than three times a week  . Frequency of Social Gatherings with Friends and Family: More than three times a week  . Attends Religious Services: More than 4 times per year  . Active Member of Clubs or Organizations: Yes  . Attends Archivist Meetings: More than 4 times per year  . Marital Status: Married   Family History  Problem Relation Age of Onset  . Hypertension Father   . Colon cancer Neg Hx   . Esophageal cancer Neg Hx   .  Rectal cancer Neg Hx   . Stomach cancer Neg Hx    Allergies  Allergen Reactions  . Allopurinol     Frequent urination  . Doxycycline     nausea  . Hctz [Hydrochlorothiazide]     gout  . Omeprazole     diarrhea  . Oxybutynin     Frequent urination per patient  . Sulfonamide Derivatives     Per pt unknown  . Clarithromycin Rash  . Penicillins Rash    Did it involve swelling of the face/tongue/throat, SOB, or low BP? Yes Did it involve sudden or severe rash/hives, skin peeling, or any reaction on the inside of your mouth or nose? No Did you need to seek medical attention at a hospital or doctor's office? No When did it last happen?childhood If all above answers are "NO", may proceed with cephalosporin use.;    Prior to Admission medications   Medication Sig Start Date End Date Taking?  Authorizing Provider  acetaminophen (TYLENOL) 500 MG tablet Take 1 tablet (500 mg total) by mouth every 6 (six) hours as needed. 07/25/20   Zigmund Gottron, NP  Cholecalciferol 1000 units tablet Take 2 tablets (2,000 Units total) by mouth daily. 10/06/15   Plotnikov, Evie Lacks, MD  colchicine 0.6 MG tablet Take two tablets prn gout attack.Then take another one in 1-2 hrs. Do not repeat for 3 days. 03/08/19   Plotnikov, Evie Lacks, MD  diclofenac Sodium (VOLTAREN) 1 % GEL Apply 4 g topically 4 (four) times daily. 07/25/20   Zigmund Gottron, NP  famotidine (PEPCID) 40 MG tablet Take 1 tablet (40 mg total) by mouth daily. 01/28/20   Plotnikov, Evie Lacks, MD  gabapentin (NEURONTIN) 100 MG capsule Take 1 capsule (100 mg total) by mouth at bedtime. 05/31/19   Hyatt, Max T, DPM  hydrocortisone (ANUSOL-HC) 25 MG suppository Place 1 suppository (25 mg total) rectally at bedtime as needed for hemorrhoids or anal itching. 12/23/18   Mauri Pole, MD  lovastatin (MEVACOR) 40 MG tablet Take 1 tablet (40 mg total) by mouth at bedtime. 01/28/20   Plotnikov, Evie Lacks, MD  metoprolol tartrate (LOPRESSOR) 50 MG tablet Take 1 tablet (50 mg total) by mouth 2 (two) times daily. 01/28/20   Plotnikov, Evie Lacks, MD  nitrofurantoin, macrocrystal-monohydrate, (MACROBID) 100 MG capsule Take 1 capsule (100 mg total) by mouth 2 (two) times daily. 11/10/20   Hoyt Koch, MD  oxybutynin (DITROPAN) 5 MG tablet TAKE 1 TABLET(5 MG) BY MOUTH AT BEDTIME 07/21/20   Plotnikov, Evie Lacks, MD  oxymetazoline (AFRIN NASAL SPRAY) 0.05 % nasal spray Place 1 spray into both nostrils 2 (two) times daily. 11/24/20   White, Leitha Schuller, NP  polyethylene glycol powder (GLYCOLAX/MIRALAX) powder Take 17 g by mouth 2 (two) times daily as needed for mild constipation, moderate constipation or severe constipation. Patient taking differently: Take 17 g by mouth as needed for mild constipation, moderate constipation or severe constipation. 07/06/18    Plotnikov, Evie Lacks, MD  spironolactone (ALDACTONE) 50 MG tablet Take 1 tablet (50 mg total) by mouth daily. 01/28/20   Plotnikov, Evie Lacks, MD     Positive ROS: Otherwise negative  All other systems have been reviewed and were otherwise negative with the exception of those mentioned in the HPI and as above.  Physical Exam: Constitutional: Alert, well-appearing, no acute distress Ears: External ears without lesions or tenderness. Ear canals are clear bilaterally with intact, clear TMs.  Nasal: External nose without lesions. Septum is  relatively midline.  She has blood clot in the left nostril that was removed with suction and she had bleeding from a anterior superior left septal vessel that bled rather briskly.  This was able to be stopped with silver nitrate cauterization.  Remaining nasal cavity was clear.. Oral: Lips and gums without lesions. Tongue and palate mucosa without lesions. Posterior oropharynx clear. Neck: No palpable adenopathy or masses Respiratory: Breathing comfortably  Skin: No facial/neck lesions or rash noted.  Control of epistaxis  Date/Time: 12/03/2020 2:26 PM Performed by: Rozetta Nunnery, MD Authorized by: Rozetta Nunnery, MD   Consent:    Consent obtained:  Verbal   Consent given by:  Patient   Risks discussed:  Bleeding and pain   Alternatives discussed:  No treatment and observation Anesthesia:    Anesthesia method:  None Procedure details:    Treatment site:  L anterior   Treatment method:  Silver nitrate   Treatment complexity:  Limited   Treatment episode: initial   Post-procedure details:    Assessment:  Bleeding stopped   Patient tolerance of procedure:  Tolerated well, no immediate complications    Assessment: Recurrent epistaxis from left anterior superior septal vessel  Plan: This was cauterized in the office today using silver nitrate. Patient tolerated this well. Reviewed with her concerning use of cottonball and Afrin  if she has any further nosebleeds to help stop the bleeding. She will follow-up as needed.   Radene Journey, MD   CC:

## 2020-12-16 ENCOUNTER — Other Ambulatory Visit: Payer: Self-pay | Admitting: Internal Medicine

## 2021-01-24 ENCOUNTER — Other Ambulatory Visit: Payer: Self-pay | Admitting: Internal Medicine

## 2021-01-26 ENCOUNTER — Other Ambulatory Visit: Payer: Self-pay

## 2021-01-27 ENCOUNTER — Ambulatory Visit (INDEPENDENT_AMBULATORY_CARE_PROVIDER_SITE_OTHER): Payer: Medicare Other | Admitting: Internal Medicine

## 2021-01-27 ENCOUNTER — Encounter: Payer: Self-pay | Admitting: Internal Medicine

## 2021-01-27 DIAGNOSIS — R739 Hyperglycemia, unspecified: Secondary | ICD-10-CM

## 2021-01-27 DIAGNOSIS — E785 Hyperlipidemia, unspecified: Secondary | ICD-10-CM

## 2021-01-27 DIAGNOSIS — I10 Essential (primary) hypertension: Secondary | ICD-10-CM

## 2021-01-27 DIAGNOSIS — M545 Low back pain, unspecified: Secondary | ICD-10-CM

## 2021-01-27 DIAGNOSIS — G8929 Other chronic pain: Secondary | ICD-10-CM

## 2021-01-27 DIAGNOSIS — E538 Deficiency of other specified B group vitamins: Secondary | ICD-10-CM

## 2021-01-27 LAB — COMPREHENSIVE METABOLIC PANEL
ALT: 11 U/L (ref 0–35)
AST: 15 U/L (ref 0–37)
Albumin: 4.3 g/dL (ref 3.5–5.2)
Alkaline Phosphatase: 59 U/L (ref 39–117)
BUN: 18 mg/dL (ref 6–23)
CO2: 25 mEq/L (ref 19–32)
Calcium: 9.6 mg/dL (ref 8.4–10.5)
Chloride: 105 mEq/L (ref 96–112)
Creatinine, Ser: 1.26 mg/dL — ABNORMAL HIGH (ref 0.40–1.20)
GFR: 39.03 mL/min — ABNORMAL LOW (ref >60.00)
Glucose, Bld: 90 mg/dL (ref 70–99)
Potassium: 4.2 mEq/L (ref 3.5–5.1)
Sodium: 140 mEq/L (ref 135–145)
Total Bilirubin: 0.6 mg/dL (ref 0.2–1.2)
Total Protein: 7 g/dL (ref 6.0–8.3)

## 2021-01-27 LAB — HEMOGLOBIN A1C: Hgb A1c MFr Bld: 6 % (ref 4.6–6.5)

## 2021-01-27 MED ORDER — LOVASTATIN 40 MG PO TABS: 40.0000 mg | ORAL_TABLET | Freq: Every day | ORAL | 3 refills | Status: DC

## 2021-01-27 MED ORDER — SPIRONOLACTONE 50 MG PO TABS: 50.0000 mg | ORAL_TABLET | Freq: Every day | ORAL | 3 refills | Status: DC

## 2021-01-27 MED ORDER — FAMOTIDINE 40 MG PO TABS: 40.0000 mg | ORAL_TABLET | Freq: Every day | ORAL | 3 refills | Status: DC

## 2021-01-27 MED ORDER — METOPROLOL TARTRATE 50 MG PO TABS: 50.0000 mg | ORAL_TABLET | Freq: Two times a day (BID) | ORAL | 3 refills | Status: DC

## 2021-01-27 NOTE — Assessment & Plan Note (Signed)
Cont w/Lovastatin 

## 2021-01-27 NOTE — Assessment & Plan Note (Signed)
Mild. Check A1c

## 2021-01-27 NOTE — Assessment & Plan Note (Signed)
Continue with Metoprolol, Spironolactone 

## 2021-01-27 NOTE — Assessment & Plan Note (Signed)
Cont w/B complex po

## 2021-01-27 NOTE — Assessment & Plan Note (Signed)
Chronic. Cont w/current  Rx - Gabapentin po

## 2021-01-27 NOTE — Addendum Note (Signed)
Addended by: Boris Lown B on: 01/27/2021 08:36 AM   Modules accepted: Orders

## 2021-01-27 NOTE — Progress Notes (Signed)
Subjective:  Patient ID: Jasmin [name], female    DOB: October 05, 1933  Age: 87 y.o. MRN: [id number]  CC: Follow-up (6 month f/u- Ref on Pepcid, Spironolactone,Lovastatin and Metporolol)   HPI Alisi L Randon presents for HTN, LBP, Vit D def, RUQ pain - resolved. UTI was treated in March  Outpatient Medications Prior to Visit  Medication Sig Dispense Refill  . acetaminophen (TYLENOL) 500 MG tablet Take 1 tablet (500 mg total) by mouth every 6 (six) hours as needed. 30 tablet 0  . Cholecalciferol 1000 units tablet Take 2 tablets (2,000 Units total) by mouth daily. 100 tablet 5  . colchicine 0.6 MG tablet Take two tablets prn gout attack.Then take another one in 1-2 hrs. Do not repeat for 3 days. 18 tablet 1  . diclofenac Sodium (VOLTAREN) 1 % GEL Apply 4 g topically 4 (four) times daily. 350 g 0  . gabapentin (NEURONTIN) 100 MG capsule Take 1 capsule (100 mg total) by mouth at bedtime. 90 capsule 3  . hydrocortisone (ANUSOL-HC) 25 MG suppository Place 1 suppository (25 mg total) rectally at bedtime as needed for hemorrhoids or anal itching. 30 suppository 0  . oxybutynin (DITROPAN) 5 MG tablet TAKE 1 TABLET(5 MG) BY MOUTH AT BEDTIME 30 tablet 5  . oxymetazoline (AFRIN NASAL SPRAY) 0.05 % nasal spray Place 1 spray into both nostrils 2 (two) times daily. 30 mL 0  . polyethylene glycol powder (GLYCOLAX/MIRALAX) powder Take 17 g by mouth 2 (two) times daily as needed for mild constipation, moderate constipation or severe constipation. (Patient taking differently: Take 17 g by mouth as needed for mild constipation, moderate constipation or severe constipation.) 500 g 3  . famotidine (PEPCID) 40 MG tablet TAKE 1 TABLET BY MOUTH  DAILY 90 tablet 2  . lovastatin (MEVACOR) 40 MG tablet TAKE 1 TABLET BY MOUTH AT  BEDTIME 90 tablet 3  . metoprolol tartrate (LOPRESSOR) 50 MG tablet TAKE 1 TABLET BY MOUTH  TWICE DAILY 180 tablet 3  . spironolactone (ALDACTONE) 50 MG tablet TAKE 1 TABLET BY MOUTH   DAILY 90 tablet 2  . nitrofurantoin, macrocrystal-monohydrate, (MACROBID) 100 MG capsule Take 1 capsule (100 mg total) by mouth 2 (two) times daily. (Patient not taking: Reported on 01/27/2021) 14 capsule 0   No facility-administered medications prior to visit.    ROS: Review of Systems  Constitutional: Negative for activity change, appetite change, chills, fatigue and unexpected weight change.  HENT: Negative for congestion, mouth sores and sinus pressure.   Eyes: Negative for visual disturbance.  Respiratory: Negative for cough and chest tightness.   Gastrointestinal: Negative for abdominal pain and nausea.  Genitourinary: Negative for difficulty urinating, frequency and vaginal pain.  Musculoskeletal: Positive for arthralgias. Negative for back pain and gait problem.  Skin: Negative for pallor and rash.  Neurological: Negative for dizziness, tremors, weakness, numbness and headaches.  Psychiatric/Behavioral: Negative for confusion, sleep disturbance and suicidal ideas.    Objective:  BP 138/72 (BP Location: Left Arm)   Pulse (!) 57   Temp 98 F (36.7 C) (Oral)   Ht 5\' 7"  (1.702 m)   Wt 218 lb 6.6 oz (99.1 kg)   SpO2 97%   BMI 34.21 kg/m   BP Readings from Last 3 Encounters:  01/27/21 138/72  11/28/20 (!) 143/68  11/24/20 (!) 153/90    Wt Readings from Last 3 Encounters:  01/27/21 218 lb 6.6 oz (99.1 kg)  11/10/20 219 lb 3.2 oz (99.4 kg)  11/09/20 218 lb (98.9 kg)  Physical Exam Constitutional:      General: She is not in acute distress.    Appearance: She is well-developed. She is obese.  HENT:     Head: Normocephalic.     Right Ear: External ear normal.     Left Ear: External ear normal.     Nose: Nose normal.  Eyes:     General:        Right eye: No discharge.        Left eye: No discharge.     Conjunctiva/sclera: Conjunctivae normal.     Pupils: Pupils are equal, round, and reactive to light.  Neck:     Thyroid: No thyromegaly.     Vascular: No JVD.      Trachea: No tracheal deviation.  Cardiovascular:     Rate and Rhythm: Normal rate and regular rhythm.     Heart sounds: Normal heart sounds.  Pulmonary:     Effort: No respiratory distress.     Breath sounds: No stridor. No wheezing.  Abdominal:     General: Bowel sounds are normal. There is no distension.     Palpations: Abdomen is soft. There is no mass.     Tenderness: There is no abdominal tenderness. There is no guarding or rebound.  Musculoskeletal:        General: No tenderness.     Cervical back: Normal range of motion and neck supple.  Lymphadenopathy:     Cervical: No cervical adenopathy.  Skin:    Findings: No erythema or rash.  Neurological:     Mental Status: She is oriented to person, place, and time.     Cranial Nerves: No cranial nerve deficit.     Motor: No abnormal muscle tone.     Coordination: Coordination normal.     Deep Tendon Reflexes: Reflexes normal.  Psychiatric:        Behavior: Behavior normal.        Thought Content: Thought content normal.        Judgment: Judgment normal.     Lab Results  Component Value Date   WBC 8.3 11/09/2020   HGB 14.1 11/09/2020   HCT 42.4 11/09/2020   PLT 84 (L) 11/09/2020   GLUCOSE 135 (H) 11/09/2020   CHOL 169 07/29/2020   TRIG 84.0 07/29/2020   HDL 61.70 07/29/2020   LDLDIRECT 139.3 05/31/2012   LDLCALC 91 07/29/2020   ALT 14 11/09/2020   AST 20 11/09/2020   NA 136 11/09/2020   K 3.9 11/09/2020   CL 101 11/09/2020   CREATININE 1.35 (H) 11/09/2020   BUN 20 11/09/2020   CO2 24 11/09/2020   TSH 1.56 07/29/2020   INR 1.16 08/16/2009    No results found.  Assessment & Plan:   There are no diagnoses linked to this encounter.   Meds ordered this encounter  Medications  . famotidine (PEPCID) 40 MG tablet    Sig: Take 1 tablet (40 mg total) by mouth daily.    Dispense:  90 tablet    Refill:  3    Requesting 1 year supply  . lovastatin (MEVACOR) 40 MG tablet    Sig: Take 1 tablet (40 mg total)  by mouth at bedtime.    Dispense:  90 tablet    Refill:  3    Requesting 1 year supply  . metoprolol tartrate (LOPRESSOR) 50 MG tablet    Sig: Take 1 tablet (50 mg total) by mouth 2 (two) times daily.    Dispense:  180 tablet    Refill:  3    Requesting 1 year supply  . spironolactone (ALDACTONE) 50 MG tablet    Sig: Take 1 tablet (50 mg total) by mouth daily.    Dispense:  90 tablet    Refill:  3    Requesting 1 year supply     Follow-up: No follow-ups on file.  Walker Kehr, MD

## 2021-02-18 ENCOUNTER — Encounter: Payer: Self-pay | Admitting: Internal Medicine

## 2021-02-18 ENCOUNTER — Ambulatory Visit: Payer: Medicare Other | Admitting: Internal Medicine

## 2021-02-18 VITALS — BP 128/60 | HR 60 | Ht 67.0 in | Wt 218.8 lb

## 2021-02-18 DIAGNOSIS — K5901 Slow transit constipation: Secondary | ICD-10-CM | POA: Diagnosis not present

## 2021-02-18 DIAGNOSIS — K6289 Other specified diseases of anus and rectum: Secondary | ICD-10-CM

## 2021-02-18 DIAGNOSIS — K649 Unspecified hemorrhoids: Secondary | ICD-10-CM

## 2021-02-18 MED ORDER — HYDROCORTISONE (PERIANAL) 2.5 % EX CREA: 1.0000 "application " | TOPICAL_CREAM | Freq: Every evening | CUTANEOUS | 1 refills | Status: DC | PRN

## 2021-02-18 NOTE — Patient Instructions (Signed)
If you are age 85 or older, your body mass index should be between 23-30. Your Body mass index is 34.27 kg/m. If this is out of the aforementioned range listed, please consider follow up with your Primary Care Provider.  If you are age 75 or younger, your body mass index should be between 19-25. Your Body mass index is 34.27 kg/m. If this is out of the aformentioned range listed, please consider follow up with your Primary Care Provider.   __________________________________________________________  The [facility] providers would like to encourage you to use [facility] to communicate with providers for non-urgent requests or questions.  Due to long hold times on the telephone, sending your provider a message by [facility] may be a faster and more efficient way to get a response.  Please allow 48 business hours for a response.  Please remember that this is for non-urgent requests.   We have sent the following medications to your pharmacy for you to pick up at your convenience:  Anusol HC cream.  Purchase Preparation H suppositories over the counter.  Apply a small amount of the Anusol cream to a suppository and insert rectally.  Take Metamucil fiber daily

## 2021-02-25 ENCOUNTER — Encounter: Payer: Self-pay | Admitting: Internal Medicine

## 2021-02-25 NOTE — Progress Notes (Signed)
HISTORY OF PRESENT ILLNESS:  Jasmin [name] is a 87 y.o. female who presents today with complaints of symptomatic hemorrhoids.  She is accompanied by her husband.  Patient was last evaluated in this office August 29, 2018 regarding functional constipation rectal discomfort secondary to fissure and hemorrhoids, and GERD.  Rectal examination at that time revealed multiple noninflamed nonthrombosed external hemorrhoids without tenderness.  The stool is Hemoccult negative.  Patient tells me that she could not afford Anusol suppositories.  She continues to use MiraLAX for constipation which helps.  Her last colonoscopy was in 2018.  Her current history is that of rectal discomfort and itching after some antibiotic therapy.  She describes fullness in the rectum.  No bleeding.  She has been using over-the-counter lidocaine cream.  Since that time, her symptoms have resolved.  No new complaints at this time.  Review of laboratories from January 27, 2021 shows unremarkable comprehensive metabolic panel except for creatinine 1.26.  CBC March 2022 was normal with hemoglobin 14.1.  REVIEW OF SYSTEMS:  All non-GI ROS negative unless otherwise stated in the HPI except for arthritis, urinary leakage, ankle swelling  Past Medical History:  Diagnosis Date   Contact dermatitis and other eczema, due to unspecified cause    Diverticulosis of colon    Esophageal stricture    GERD with stricture    Dr Henrene Pastor   Gout    Hemorrhoids    Hiatal hernia    Hyperlipidemia    Hypertension    Onychomycosis    Personal history of urinary calculi    Polycystic kidney    Tubulovillous adenoma polyp of colon    Unspecified vitamin D deficiency    Vitamin B12 deficiency 2009   Borderline    Past Surgical History:  Procedure Laterality Date   CATARACT EXTRACTION Bilateral 08/24/2015   COLONOSCOPY     DILATION AND CURETTAGE OF UTERUS  08/24/1975   TOE SURGERY  08/23/2010   Right foot    TONSILLECTOMY     as child     Social History Jasmin [name]  reports that she has never smoked. She has never used smokeless tobacco. She reports that she does not drink alcohol and does not use drugs.  family history includes Hypertension in her father.  Allergies  Allergen Reactions   Allopurinol     Frequent urination   Doxycycline     nausea   Hctz [Hydrochlorothiazide]     gout   Omeprazole     diarrhea   Oxybutynin     Frequent urination per patient   Sulfonamide Derivatives     Per pt unknown   Clarithromycin Rash   Penicillins Rash    Did it involve swelling of the face/tongue/throat, SOB, or low BP? Yes Did it involve sudden or severe rash/hives, skin peeling, or any reaction on the inside of your mouth or nose? No Did you need to seek medical attention at a hospital or doctor's office? No When did it last happen?     childhood If all above answers are "NO", may proceed with cephalosporin use.;        PHYSICAL EXAMINATION: Vital signs: BP 128/60   Pulse 60   Ht 5\' 7"  (1.702 m)   Wt 218 lb 12.8 oz (99.2 kg)   SpO2 97%   BMI 34.27 kg/m   Constitutional: generally well-appearing, no acute distress Psychiatric: alert and oriented x3, cooperative Eyes: Anicteric Mouth: Mask Abdomen: Not reexamined  Rectal: Not reexamined Extremities: no clubbing  or cyanosis of the extremities. Skin: no lesions on visible extremities Neuro: No gross deficits  ASSESSMENT:  1.  Transient symptomatic external hemorrhoids.  Resolved 2.  Functional constipation.  Managed with MiraLAX 3.  GERD.  Last upper endoscopy 2008 4.  History of adenomatous colon polyps.  Last colonoscopy 2018.  Aged out of surveillance   PLAN:  1.  Prescribed the equivalent of Anusol HC suppositories.  This was sent to specialty pharmacy.  She will have this available should she have a flareup with her hemorrhoids. 2.  Continue MiraLAX to manage constipation 3.  Reflux precautions 4.  Resume general medical care with  Dr. Alain Marion.  GI follow-up as needed

## 2021-06-29 ENCOUNTER — Ambulatory Visit (HOSPITAL_COMMUNITY)
Admission: EM | Admit: 2021-06-29 | Discharge: 2021-06-29 | Disposition: A | Discharge status: Home / Self Care | Payer: Medicare Other | Attending: Emergency Medicine | Admitting: Emergency Medicine

## 2021-06-29 ENCOUNTER — Encounter (HOSPITAL_COMMUNITY): Payer: Self-pay | Admitting: Emergency Medicine

## 2021-06-29 DIAGNOSIS — M79604 Pain in right leg: Secondary | ICD-10-CM

## 2021-06-29 MED ORDER — MELOXICAM 7.5 MG PO TABS: 7.5000 mg | ORAL_TABLET | Freq: Every day | ORAL | 0 refills | Status: DC

## 2021-06-29 MED ORDER — DICLOFENAC SODIUM 1 % EX GEL: 2.0000 g | Freq: Four times a day (QID) | CUTANEOUS | 0 refills | Status: DC

## 2021-06-29 NOTE — ED Provider Notes (Signed)
[facility]    CSN: [id number] Arrival date & time: 06/29/21  0973      History   Chief Complaint Chief Complaint  Patient presents with   Leg Pain    HPI Jasmin SLIVA is a 87 y.o. female.   Patient presents with pain of the right lower extremity predominant only lateral back for 5 days.  Onset of symptoms started abruptly, denies any precipitating event, injury or trauma.  Worse extended period of walking and when going up and down stairs.  Used Tylenol which did resolve symptoms and attempted use of IcyHot which did provide minimal relief.  History of hypertension, hyperlipidemia, GERD, gout in the polycystic kidney. denies Numbness or tingling  Past Medical History:  Diagnosis Date   Contact dermatitis and other eczema, due to unspecified cause    Diverticulosis of colon    Esophageal stricture    GERD with stricture    Dr Henrene Pastor   Gout    Hemorrhoids    Hiatal hernia    Hyperlipidemia    Hypertension    Onychomycosis    Personal history of urinary calculi    Polycystic kidney    Tubulovillous adenoma polyp of colon    Unspecified vitamin D deficiency    Vitamin B12 deficiency 2009   Borderline    Patient Active Problem List   Diagnosis Date Noted   Hyperglycemia 01/27/2021   Aortic atherosclerosis ([place]) 11/10/2020   Nocturia 01/28/2020   RLQ abdominal pain 06/07/2019   Bilateral primary osteoarthritis of knee 09/13/2018   Constipation 07/06/2018   Peripheral neuropathic pain 12/28/2017   Gout 08/30/2017   History of colonic polyps 08/13/2016   Lipoma of arm 09/30/2014   WBC decreased 05/22/2013   Low back pain 11/16/2012   Obesity 05/31/2012   Dyspnea 03/10/2012   Edema 03/10/2012   Memory problem 11/24/2011   Internal hemorrhoid 05/20/2011   Well adult exam 11/17/2010   ONYCHOMYCOSIS 06/10/2009   DYSPHAGIA 09/10/2008   B12 deficiency 04/15/2008   ESOPHAGEAL STRICTURE 12/05/2007   Hiatal hernia 12/05/2007   FRACTURE, RIGHT LEG  12/05/2007   NEPHROLITHIASIS, HX OF 12/05/2007   PARESTHESIA 09/22/2007   GERD 08/09/2007   Vitamin D deficiency 03/20/2007   Dyslipidemia 03/20/2007   Essential hypertension 03/20/2007   DIVERTICULOSIS, COLON 03/20/2007    Past Surgical History:  Procedure Laterality Date   CATARACT EXTRACTION Bilateral 08/24/2015   COLONOSCOPY     DILATION AND CURETTAGE OF UTERUS  08/24/1975   TOE SURGERY  08/23/2010   Right foot    TONSILLECTOMY     as child    OB History   No obstetric history on file.      Home Medications    Prior to Admission medications   Medication Sig Start Date End Date Taking? Authorizing Provider  meloxicam (MOBIC) 7.5 MG tablet Take 1 tablet (7.5 mg total) by mouth daily. 06/29/21  Yes Krystan Northrop, Leitha Schuller, NP  acetaminophen (TYLENOL) 500 MG tablet Take 1 tablet (500 mg total) by mouth every 6 (six) hours as needed. 07/25/20   Zigmund Gottron, NP  Cholecalciferol 1000 units tablet Take 2 tablets (2,000 Units total) by mouth daily. 10/06/15   Plotnikov, Evie Lacks, MD  colchicine 0.6 MG tablet Take two tablets prn gout attack.Then take another one in 1-2 hrs. Do not repeat for 3 days. 03/08/19   Plotnikov, Evie Lacks, MD  diclofenac Sodium (VOLTAREN) 1 % GEL Apply 2 g topically 4 (four) times daily. 06/29/21  Garfield Coiner R, NP  famotidine (PEPCID) 40 MG tablet Take 1 tablet (40 mg total) by mouth daily. 01/27/21   Plotnikov, Evie Lacks, MD  gabapentin (NEURONTIN) 100 MG capsule Take 1 capsule (100 mg total) by mouth at bedtime. 05/31/19   Hyatt, Max T, DPM  hydrocortisone (ANUSOL-HC) 2.5 % rectal cream Place 1 application rectally at bedtime as needed for hemorrhoids or anal itching. 02/18/21   Irene Shipper, MD  lovastatin (MEVACOR) 40 MG tablet Take 1 tablet (40 mg total) by mouth at bedtime. 01/27/21   Plotnikov, Evie Lacks, MD  metoprolol tartrate (LOPRESSOR) 50 MG tablet Take 1 tablet (50 mg total) by mouth 2 (two) times daily. 01/27/21   Plotnikov, Evie Lacks, MD   oxybutynin (DITROPAN) 5 MG tablet TAKE 1 TABLET(5 MG) BY MOUTH AT BEDTIME 07/21/20   Plotnikov, Evie Lacks, MD  polyethylene glycol powder (GLYCOLAX/MIRALAX) powder Take 17 g by mouth 2 (two) times daily as needed for mild constipation, moderate constipation or severe constipation. Patient taking differently: Take 17 g by mouth as needed for mild constipation, moderate constipation or severe constipation. 07/06/18   Plotnikov, Evie Lacks, MD  spironolactone (ALDACTONE) 50 MG tablet Take 1 tablet (50 mg total) by mouth daily. 01/27/21   Plotnikov, Evie Lacks, MD    Family History Family History  Problem Relation Age of Onset   Hypertension Father    Colon cancer Neg Hx    Esophageal cancer Neg Hx    Rectal cancer Neg Hx    Stomach cancer Neg Hx     Social History Social History   Tobacco Use   Smoking status: Never   Smokeless tobacco: Never  Vaping Use   Vaping Use: Never used  Substance Use Topics   Alcohol use: No    Alcohol/week: 0.0 standard drinks   Drug use: No     Allergies   Allopurinol, Doxycycline, Hctz [hydrochlorothiazide], Omeprazole, Oxybutynin, Sulfonamide derivatives, Clarithromycin, and Penicillins   Review of Systems Review of Systems  Constitutional: Negative.   Respiratory: Negative.    Cardiovascular: Negative.   Genitourinary: Negative.   Skin: Negative.   Neurological: Negative.   Psychiatric/Behavioral: Negative.      Physical Exam Triage Vital Signs ED Triage Vitals  Enc Vitals Group     BP 06/29/21 1222 135/76     Pulse Rate 06/29/21 1222 61     Resp 06/29/21 1222 17     Temp 06/29/21 1222 98.1 F (36.7 C)     Temp Source 06/29/21 1222 Oral     SpO2 06/29/21 1222 98 %     Weight --      Height --      Head Circumference --      Peak Flow --      Pain Score 06/29/21 1221 3     Pain Loc --      Pain Edu? --      Excl. in [place]? --    No data found.  Updated Vital Signs BP 135/76 (BP Location: Right Arm)   Pulse 61   Temp 98.1 F  (36.7 C) (Oral)   Resp 17   SpO2 98%   Visual Acuity Right Eye Distance:   Left Eye Distance:   Bilateral Distance:    Right Eye Near:   Left Eye Near:    Bilateral Near:     Physical Exam Constitutional:      Appearance: Normal appearance. She is normal weight.  HENT:     Head: Normocephalic.  Eyes:     Extraocular Movements: Extraocular movements intact.  Pulmonary:     Effort: Pulmonary effort is normal.  Musculoskeletal:       Legs:     Comments: Tenderness noted around the lateral aspect of the right lower extremity, no erythema, ecchymosis, no tenderness noted, no warmth to the skin, negative Homans' sign, range of motion intact  Skin:    General: Skin is warm and dry.  Neurological:     Mental Status: She is alert and oriented to person, place, and time. Mental status is at baseline.  Psychiatric:        Mood and Affect: Mood normal.        Behavior: Behavior normal.     UC Treatments / Results  Labs (all labs ordered are listed, but only abnormal results are displayed) Labs Reviewed - No data to display  EKG   Radiology No results found.  Procedures Procedures (including critical care time)  Medications Ordered in UC Medications - No data to display  Initial Impression / Assessment and Plan / UC Course  I have reviewed the triage vital signs and the nursing notes.  Pertinent labs & imaging results that were available during my care of the patient were reviewed by me and considered in my medical decision making (see chart for details).  Acute pain in the lower extremity, right   1.  Meloxicam 7.5 mg daily as needed 2.  Diclofenac gel 1% 4 times a day as needed 3.  Continue use of over-the-counter remedies, advised pillows for support, rest and elevation 4.  Has wellness visit scheduled with physician this upcoming Friday, recommended follow-up at that time for reevaluation, also given walking referral to Ortho for evaluation for persistent  symptoms Final Clinical Impressions(s) / UC Diagnoses   Final diagnoses:  Acute pain of lower extremity, right     Discharge Instructions      Your pain is most likely caused by irritation to the muscles or ligaments.   You can continue use of tylenol as needed, can take meloxicam once daily in addition to help with pain  May use diclofenac gel directly over area 4 times a day as needed  Follow up at your wellness visit Friday if symptoms don't improve   You may use heating pad in 15 minute intervals as needed for additional comfort  Begin stretching affected area daily for 10 minutes as tolerated to further loosen muscles   When lying down place pillow underneath and between knees for support  If pain persist after recommended treatment or reoccurs if may be beneficial to follow up with orthopedic specialist for evaluation, this doctor specializes in the bones and can manage your symptoms long-term with options such as but not limited to imaging, medications or physical therapy      ED Prescriptions     Medication Sig Dispense Auth. Provider   meloxicam (MOBIC) 7.5 MG tablet Take 1 tablet (7.5 mg total) by mouth daily. 30 tablet Jia Dottavio R, NP   diclofenac Sodium (VOLTAREN) 1 % GEL Apply 2 g topically 4 (four) times daily. 100 g Hans Eden, NP      PDMP not reviewed this encounter.   Hans Eden, NP 06/29/21 1311

## 2021-06-29 NOTE — ED Triage Notes (Signed)
Pt c/o RLE pain since Thursday night. Unsure if pulled muscle from going up and down stairs in split level house. Pt reports that pain is worse when having to walk a lot. Pt put icy hot on leg and reports feeling better. Denies injuries, swelling, or falls.

## 2021-06-29 NOTE — Discharge Instructions (Signed)
Your pain is most likely caused by irritation to the muscles or ligaments.   You can continue use of tylenol as needed, can take meloxicam once daily in addition to help with pain  May use diclofenac gel directly over area 4 times a day as needed  Follow up at your wellness visit Friday if symptoms don't improve   You may use heating pad in 15 minute intervals as needed for additional comfort  Begin stretching affected area daily for 10 minutes as tolerated to further loosen muscles   When lying down place pillow underneath and between knees for support  If pain persist after recommended treatment or reoccurs if may be beneficial to follow up with orthopedic specialist for evaluation, this doctor specializes in the bones and can manage your symptoms long-term with options such as but not limited to imaging, medications or physical therapy

## 2021-07-03 ENCOUNTER — Other Ambulatory Visit: Payer: Self-pay

## 2021-07-03 ENCOUNTER — Ambulatory Visit: Payer: Medicare Other

## 2021-07-03 ENCOUNTER — Ambulatory Visit (INDEPENDENT_AMBULATORY_CARE_PROVIDER_SITE_OTHER): Payer: Medicare Other

## 2021-07-03 ENCOUNTER — Telehealth: Payer: Self-pay | Admitting: Internal Medicine

## 2021-07-03 VITALS — BP 132/76 | HR 61 | Temp 98.2°F | Ht 65.0 in | Wt 226.0 lb

## 2021-07-03 DIAGNOSIS — Z0000 Encounter for general adult medical examination without abnormal findings: Secondary | ICD-10-CM | POA: Diagnosis not present

## 2021-07-03 NOTE — Telephone Encounter (Signed)
Per chart pt went to ER concerning her leg pain. In the notes it states to f/u w/  Ortho, [facility] (Specialist); If symptoms worsen, but pt has ,made a ER f/u w/ Dr. Quay Burow for 07/07/21.Marland KitchenJohny Chess

## 2021-07-03 NOTE — Telephone Encounter (Signed)
Pt. Connected to [facility] health 11.10.2022. Caller returning call from office. States she had a missed call. Also needs to know which Dr to go to about her leg and she would need to call back at 8am today. Office hours provided.  Please advise.

## 2021-07-03 NOTE — Patient Instructions (Signed)
Jasmin [name] , Thank you for taking time to come for your Medicare Wellness Visit. I appreciate your ongoing commitment to your health goals. Please review the following plan we discussed and let me know if I can assist you in the future.   Screening recommendations/referrals: Colonoscopy: no longer required  Mammogram: no longer required  Bone Density: 12/21/2016 Recommended yearly ophthalmology/optometry visit for glaucoma screening and checkup Recommended yearly dental visit for hygiene and checkup  Vaccinations: Influenza vaccine: completed  Pneumococcal vaccine: completed  Tdap vaccine: 03/02/2013 Shingles vaccine: completed     Advanced directives: none   Conditions/risks identified: none   Next appointment: 07/07/2021  0750am  Dr.Burns    Preventive Care 8 Years and Older, Female Preventive care refers to lifestyle choices and visits with your health care provider that can promote health and wellness. What does preventive care include? A yearly physical exam. This is also called an annual well check. Dental exams once or twice a year. Routine eye exams. Ask your health care provider how often you should have your eyes checked. Personal lifestyle choices, including: Daily care of your teeth and gums. Regular physical activity. Eating a healthy diet. Avoiding tobacco and drug use. Limiting alcohol use. Practicing safe sex. Taking low-dose aspirin every day. Taking vitamin and mineral supplements as recommended by your health care provider. What happens during an annual well check? The services and screenings done by your health care provider during your annual well check will depend on your age, overall health, lifestyle risk factors, and family history of disease. Counseling  Your health care provider may ask you questions about your: Alcohol use. Tobacco use. Drug use. Emotional well-being. Home and relationship well-being. Sexual activity. Eating  habits. History of falls. Memory and ability to understand (cognition). Work and work Statistician. Reproductive health. Screening  You may have the following tests or measurements: Height, weight, and BMI. Blood pressure. Lipid and cholesterol levels. These may be checked every 5 years, or more frequently if you are over 37 years old. Skin check. Lung cancer screening. You may have this screening every year starting at age 59 if you have a 30-pack-year history of smoking and currently smoke or have quit within the past 15 years. Fecal occult blood test (FOBT) of the stool. You may have this test every year starting at age 43. Flexible sigmoidoscopy or colonoscopy. You may have a sigmoidoscopy every 5 years or a colonoscopy every 10 years starting at age 86. Hepatitis C blood test. Hepatitis B blood test. Sexually transmitted disease (STD) testing. Diabetes screening. This is done by checking your blood sugar (glucose) after you have not eaten for a while (fasting). You may have this done every 1-3 years. Bone density scan. This is done to screen for osteoporosis. You may have this done starting at age 37. Mammogram. This may be done every 1-2 years. Talk to your health care provider about how often you should have regular mammograms. Talk with your health care provider about your test results, treatment options, and if necessary, the need for more tests. Vaccines  Your health care provider may recommend certain vaccines, such as: Influenza vaccine. This is recommended every year. Tetanus, diphtheria, and acellular pertussis (Tdap, Td) vaccine. You may need a Td booster every 10 years. Zoster vaccine. You may need this after age 28. Pneumococcal 13-valent conjugate (PCV13) vaccine. One dose is recommended after age 48. Pneumococcal polysaccharide (PPSV23) vaccine. One dose is recommended after age 70. Talk to your health care provider about  which screenings and vaccines you need and how  often you need them. This information is not intended to replace advice given to you by your health care provider. Make sure you discuss any questions you have with your health care provider. Document Released: 09/05/2015 Document Revised: 04/28/2016 Document Reviewed: 06/10/2015 Elsevier Interactive Patient Education  2017 [place] Prevention in the Home Falls can cause injuries. They can happen to people of all ages. There are many things you can do to make your home safe and to help prevent falls. What can I do on the outside of my home? Regularly fix the edges of walkways and driveways and fix any cracks. Remove anything that might make you trip as you walk through a door, such as a raised step or threshold. Trim any bushes or trees on the path to your home. Use bright outdoor lighting. Clear any walking paths of anything that might make someone trip, such as rocks or tools. Regularly check to see if handrails are loose or broken. Make sure that both sides of any steps have handrails. Any raised decks and porches should have guardrails on the edges. Have any leaves, snow, or ice cleared regularly. Use sand or salt on walking paths during winter. Clean up any spills in your garage right away. This includes oil or grease spills. What can I do in the bathroom? Use night lights. Install grab bars by the toilet and in the tub and shower. Do not use towel bars as grab bars. Use non-skid mats or decals in the tub or shower. If you need to sit down in the shower, use a plastic, non-slip stool. Keep the floor dry. Clean up any water that spills on the floor as soon as it happens. Remove soap buildup in the tub or shower regularly. Attach bath mats securely with double-sided non-slip rug tape. Do not have throw rugs and other things on the floor that can make you trip. What can I do in the bedroom? Use night lights. Make sure that you have a light by your bed that is easy to  reach. Do not use any sheets or blankets that are too big for your bed. They should not hang down onto the floor. Have a firm chair that has side arms. You can use this for support while you get dressed. Do not have throw rugs and other things on the floor that can make you trip. What can I do in the kitchen? Clean up any spills right away. Avoid walking on wet floors. Keep items that you use a lot in easy-to-reach places. If you need to reach something above you, use a strong step stool that has a grab bar. Keep electrical cords out of the way. Do not use floor polish or wax that makes floors slippery. If you must use wax, use non-skid floor wax. Do not have throw rugs and other things on the floor that can make you trip. What can I do with my stairs? Do not leave any items on the stairs. Make sure that there are handrails on both sides of the stairs and use them. Fix handrails that are broken or loose. Make sure that handrails are as long as the stairways. Check any carpeting to make sure that it is firmly attached to the stairs. Fix any carpet that is loose or worn. Avoid having throw rugs at the top or bottom of the stairs. If you do have throw rugs, attach them to the floor with  carpet tape. Make sure that you have a light switch at the top of the stairs and the bottom of the stairs. If you do not have them, ask someone to add them for you. What else can I do to help prevent falls? Wear shoes that: Do not have high heels. Have rubber bottoms. Are comfortable and fit you well. Are closed at the toe. Do not wear sandals. If you use a stepladder: Make sure that it is fully opened. Do not climb a closed stepladder. Make sure that both sides of the stepladder are locked into place. Ask someone to hold it for you, if possible. Clearly mark and make sure that you can see: Any grab bars or handrails. First and last steps. Where the edge of each step is. Use tools that help you move  around (mobility aids) if they are needed. These include: Canes. Walkers. Scooters. Crutches. Turn on the lights when you go into a dark area. Replace any light bulbs as soon as they burn out. Set up your furniture so you have a clear path. Avoid moving your furniture around. If any of your floors are uneven, fix them. If there are any pets around you, be aware of where they are. Review your medicines with your doctor. Some medicines can make you feel dizzy. This can increase your chance of falling. Ask your doctor what other things that you can do to help prevent falls. This information is not intended to replace advice given to you by your health care provider. Make sure you discuss any questions you have with your health care provider. Document Released: 06/05/2009 Document Revised: 01/15/2016 Document Reviewed: 09/13/2014 Elsevier Interactive Patient Education  2017 [employer].

## 2021-07-03 NOTE — Progress Notes (Addendum)
Subjective:   Jasmin [name] is a 87 y.o. female who presents for Medicare Annual (Subsequent) preventive examination.  Review of Systems     Cardiac Risk Factors include: advanced age (>7men, >58 women);dyslipidemia;hypertension     Objective:    Today's Vitals   07/03/21 1119 07/03/21 1122  BP: 132/76   Pulse: 61   Temp: 98.2 F (36.8 C)   SpO2: 96%   Weight: 226 lb (102.5 kg)   Height: 5\' 5"  (1.651 m)   PainSc:  4    Body mass index is 37.61 kg/m.  Advanced Directives 07/03/2021 03/13/2020 03/08/2019 09/24/2018 03/02/2018 07/13/2017 02/28/2017  Does Patient Have a Medical Advance Directive? Yes Yes Yes No Yes No No  Type of Paramedic of [place];Living will [place];Living will [place];Living will - [place];Living will - -  Does patient want to make changes to medical advance directive? - No - Patient declined - - - - -  Copy of [place] in Chart? No - copy requested No - copy requested No - copy requested - No - copy requested - -  Would patient like information on creating a medical advance directive? - - - - - - -    Current Medications (verified) Outpatient Encounter Medications as of 07/03/2021  Medication Sig   acetaminophen (TYLENOL) 500 MG tablet Take 1 tablet (500 mg total) by mouth every 6 (six) hours as needed.   colchicine 0.6 MG tablet Take two tablets prn gout attack.Then take another one in 1-2 hrs. Do not repeat for 3 days.   diclofenac Sodium (VOLTAREN) 1 % GEL Apply 2 g topically 4 (four) times daily.   famotidine (PEPCID) 40 MG tablet Take 1 tablet (40 mg total) by mouth daily.   hydrocortisone (ANUSOL-HC) 2.5 % rectal cream Place 1 application rectally at bedtime as needed for hemorrhoids or anal itching.   lovastatin (MEVACOR) 40 MG tablet Take 1 tablet (40 mg total) by mouth at bedtime.   meloxicam (MOBIC) 7.5 MG tablet Take 1 tablet (7.5 mg  total) by mouth daily.   metoprolol tartrate (LOPRESSOR) 50 MG tablet Take 1 tablet (50 mg total) by mouth 2 (two) times daily.   polyethylene glycol powder (GLYCOLAX/MIRALAX) powder Take 17 g by mouth 2 (two) times daily as needed for mild constipation, moderate constipation or severe constipation. (Patient taking differently: Take 17 g by mouth as needed for mild constipation, moderate constipation or severe constipation.)   spironolactone (ALDACTONE) 50 MG tablet Take 1 tablet (50 mg total) by mouth daily.   Cholecalciferol 1000 units tablet Take 2 tablets (2,000 Units total) by mouth daily. (Patient not taking: Reported on 07/03/2021)   gabapentin (NEURONTIN) 100 MG capsule Take 1 capsule (100 mg total) by mouth at bedtime. (Patient not taking: Reported on 07/03/2021)   oxybutynin (DITROPAN) 5 MG tablet TAKE 1 TABLET(5 MG) BY MOUTH AT BEDTIME (Patient not taking: Reported on 07/03/2021)   No facility-administered encounter medications on file as of 07/03/2021.    Allergies (verified) Allopurinol, Doxycycline, Hctz [hydrochlorothiazide], Omeprazole, Oxybutynin, Sulfonamide derivatives, Clarithromycin, and Penicillins   History: Past Medical History:  Diagnosis Date   Contact dermatitis and other eczema, due to unspecified cause    Diverticulosis of colon    Esophageal stricture    GERD with stricture    Dr Henrene Pastor   Gout    Hemorrhoids    Hiatal hernia    Hyperlipidemia    Hypertension  Onychomycosis    Personal history of urinary calculi    Polycystic kidney    Tubulovillous adenoma polyp of colon    Unspecified vitamin D deficiency    Vitamin B12 deficiency 2009   Borderline   Past Surgical History:  Procedure Laterality Date   CATARACT EXTRACTION Bilateral 08/24/2015   COLONOSCOPY     DILATION AND CURETTAGE OF UTERUS  08/24/1975   TOE SURGERY  08/23/2010   Right foot    TONSILLECTOMY     as child   Family History  Problem Relation Age of Onset   Hypertension  Father    Colon cancer Neg Hx    Esophageal cancer Neg Hx    Rectal cancer Neg Hx    Stomach cancer Neg Hx    Social History   Socioeconomic History   Marital status: Married    Spouse name: Not on file   Number of children: 2   Years of education: Not on file   Highest education level: Not on file  Occupational History   Occupation: RETIRED  Tobacco Use   Smoking status: Never   Smokeless tobacco: Never  Vaping Use   Vaping Use: Never used  Substance and Sexual Activity   Alcohol use: No    Alcohol/week: 0.0 standard drinks   Drug use: No   Sexual activity: Not Currently  Other Topics Concern   Not on file  Social History Narrative   GYN: Dr Philis Pique      FAMILY HISTORY HYPERTENSION      Regular exercise - NO   Social Determinants of Health   Financial Resource Strain: Low Risk    Difficulty of Paying Living Expenses: Not hard at all  Food Insecurity: No Food Insecurity   Worried About Charity fundraiser in the Last Year: Never true   Arboriculturist in the Last Year: Never true  Transportation Needs: No Transportation Needs   Lack of Transportation (Medical): No   Lack of Transportation (Non-Medical): No  Physical Activity: Sufficiently Active   Days of Exercise per Week: 3 days   Minutes of Exercise per Session: 60 min  Stress: No Stress Concern Present   Feeling of Stress : Only a little  Social Connections: Engineer, building services of Communication with Friends and Family: Twice a week   Frequency of Social Gatherings with Friends and Family: Twice a week   Attends Religious Services: More than 4 times per year   Active Member of [employer] or Organizations: Yes   Attends Music therapist: More than 4 times per year   Marital Status: Married    Tobacco Counseling Counseling given: Not Answered   Clinical Intake:  Pre-visit preparation completed: Yes  Pain : 0-10 Pain Score: 4  Pain Type: Acute pain Pain Location: Leg Pain  Orientation: Right Pain Radiating Towards: strained muscle Pain Descriptors / Indicators: Aching, Burning, Dull Pain Onset: In the past 7 days Pain Frequency: Intermittent Pain Relieving Factors: meloxicam Effect of Pain on Daily Activities: yes  Pain Relieving Factors: meloxicam  Nutritional Risks: None Diabetes: No  How often do you need to have someone help you when you read instructions, pamphlets, or other written materials from your doctor or pharmacy?: 1 - Never What is the last grade level you completed in school?: High school  Diabetic?no   Interpreter Needed?: No  Information entered by :: L.Harper Vandervoort,LPN   Activities of Daily Living In your present state of health, do you have any  difficulty performing the following activities: 07/03/2021  Hearing? N  Vision? N  Difficulty concentrating or making decisions? N  Walking or climbing stairs? N  Dressing or bathing? N  Doing errands, shopping? N  Preparing Food and eating ? N  Using the Toilet? N  In the past six months, have you accidently leaked urine? N  Do you have problems with loss of bowel control? N  Managing your Medications? N  Managing your Finances? N  Housekeeping or managing your Housekeeping? N  Some recent data might be hidden    Patient Care Team: Plotnikov, Evie Lacks, MD as PCP - General Henrene Pastor Docia Chuck, MD (Gastroenterology) Janyth Pupa, DO as Consulting Physician (Obstetrics and Gynecology) Leandrew Koyanagi, MD as Attending Physician (Orthopedic Surgery) [facility]  Indicate any recent Medical Services you may have received from other than [facility] providers in the past year (date may be approximate).     Assessment:   This is a routine wellness examination for [place].  Hearing/Vision screen Vision Screening - Comments:: Annual eye exams wear glasses   Dietary issues and exercise activities discussed: Current Exercise Habits: Home exercise routine, Type of exercise:  walking, Time (Minutes): 30, Frequency (Times/Week): 3, Weekly Exercise (Minutes/Week): 90, Intensity: Mild, Exercise limited by: orthopedic condition(s)   Goals Addressed             This Visit's Progress    Exercise 150 minutes per week (moderate activity)   On track    Continue with silver sneakers; and may consider zumba or water areobics Try to build up to 5 days a week to help with weight loss Walking counts!!        Depression Screen PHQ 2/9 Scores 07/03/2021 07/03/2021 03/13/2020 06/07/2019 03/08/2019 03/02/2018 08/30/2017  PHQ - 2 Score 0 0 0 0 0 0 0    Fall Risk Fall Risk  07/03/2021 03/13/2020 06/07/2019 03/08/2019 03/02/2018  Falls in the past year? 0 0 0 0 No  Number falls in past yr: 0 0 - 0 -  Injury with Fall? 0 0 - - -  Risk for fall due to : - No Fall Risks - Impaired mobility;Impaired balance/gait -  Follow up Falls evaluation completed Falls evaluation completed;Education provided Falls evaluation completed Falls prevention discussed -    FALL RISK PREVENTION PERTAINING TO THE HOME:  Any stairs in or around the home? Yes  If so, are there any without handrails? No  Home free of loose throw rugs in walkways, pet beds, electrical cords, etc? Yes  Adequate lighting in your home to reduce risk of falls? Yes   ASSISTIVE DEVICES UTILIZED TO PREVENT FALLS:  Life alert? No  Use of a cane, walker or w/c? No  Grab bars in the bathroom? No  Shower chair or bench in shower? Yes  Elevated toilet seat or a handicapped toilet? No   TIMED UP AND GO:  Was the test performed? Yes .  Length of time to ambulate 10 feet: 10  sec.   Gait steady and fast without use of assistive device  Cognitive Function: Normal cognitive status assessed by direct observation by this Nurse Health Advisor. No abnormalities found.   MMSE - Mini Mental State Exam 03/02/2018  Orientation to time 5  Orientation to Place 5  Registration 3  Attention/ Calculation 3  Recall 3  Language-  name 2 objects 2  Language- repeat 1  Language- follow 3 step command 3  Language- read & follow direction 1  Write a sentence 1  Copy design 1  Total score 28     6CIT Screen 03/13/2020  What Year? 0 points  What month? 0 points  What time? 0 points  Count back from 20 0 points  Months in reverse 0 points  Repeat phrase 0 points  Total Score 0    Immunizations Immunization History  Administered Date(s) Administered   Fluad Quad(high Dose 65+) 04/11/2019, 07/29/2020   Influenza Split 05/20/2011, 05/31/2012   Influenza Whole 08/24/2005, 06/21/2008, 06/10/2009   Influenza, High Dose Seasonal PF 06/15/2017, 06/02/2018   Influenza,inj,Quad PF,6+ Mos 05/22/2013, 05/29/2014, 06/04/2015, 04/16/2016   PFIZER(Purple Top)SARS-COV-2 Vaccination 09/02/2019, 10/13/2019, 05/19/2020   Pneumococcal Conjugate-13 09/21/2013   Pneumococcal Polysaccharide-23 08/25/2004, 11/12/2009, 05/31/2012   Td 03/02/2013   Zoster Recombinat (Shingrix) 03/02/2017, 06/13/2017    TDAP status: Up to date  Flu Vaccine status: Up to date  Pneumococcal vaccine status: Up to date  Covid-19 vaccine status: Completed vaccines  Qualifies for Shingles Vaccine? Yes   Zostavax completed No   Shingrix Completed?: Yes  Screening Tests Health Maintenance  Topic Date Due   COLONOSCOPY (Pts 45-73yrs Insurance coverage will need to be confirmed)  03/14/2020   COVID-19 Vaccine (4 - Booster for Pfizer series) 07/14/2020   INFLUENZA VACCINE  03/23/2021   TETANUS/TDAP  03/03/2023   Pneumonia Vaccine 37+ Years old  Completed   DEXA SCAN  Completed   Zoster Vaccines- Shingrix  Completed   HPV VACCINES  Aged Out    Health Maintenance  Health Maintenance Due  Topic Date Due   COLONOSCOPY (Pts 45-42yrs Insurance coverage will need to be confirmed)  03/14/2020   COVID-19 Vaccine (4 - Booster for Pfizer series) 07/14/2020   INFLUENZA VACCINE  03/23/2021    Colorectal cancer screening: No longer required.    Mammogram status: No longer required due to age.  Bone Density status: Completed 12/21/2016. Results reflect: Bone density results: OSTEOPENIA. Repeat every 5 years.  Lung Cancer Screening: (Low Dose CT Chest recommended if Age 34-80 years, 30 pack-year currently smoking OR have quit w/in 15years.) does not qualify.   Lung Cancer Screening Referral: n/a  Additional Screening:  Hepatitis C Screening: does not qualify;   Vision Screening: Recommended annual ophthalmology exams for early detection of glaucoma and other disorders of the eye. Is the patient up to date with their annual eye exam?  Yes  Who is the provider or what is the name of the office in which the patient attends annual eye exams? Dr.Digby  If pt is not established with a provider, would they like to be referred to a provider to establish care? No .   Dental Screening: Recommended annual dental exams for proper oral hygiene  Community Resource Referral / Chronic Care Management: CRR required this visit?  No   CCM required this visit?  No      Plan:     I have personally reviewed and noted the following in the patient's chart:   Medical and social history Use of alcohol, tobacco or illicit drugs  Current medications and supplements including opioid prescriptions.  Functional ability and status Nutritional status Physical activity Advanced directives List of other physicians Hospitalizations, surgeries, and ER visits in previous 12 months Vitals Screenings to include cognitive, depression, and falls Referrals and appointments  In addition, I have reviewed and discussed with patient certain preventive protocols, quality metrics, and best practice recommendations. A written personalized care plan for preventive services as well as general preventive health recommendations were  provided to patient.     Randel Pigg, LPN   [id number]   Nurse Notes: none    Medical screening  examination/treatment/procedure(s) were performed by non-physician practitioner and as supervising physician I was immediately available for consultation/collaboration.  I agree with above. Lew Dawes, MD

## 2021-07-07 ENCOUNTER — Encounter: Payer: Self-pay | Admitting: Internal Medicine

## 2021-07-07 ENCOUNTER — Other Ambulatory Visit: Payer: Self-pay

## 2021-07-07 ENCOUNTER — Ambulatory Visit (INDEPENDENT_AMBULATORY_CARE_PROVIDER_SITE_OTHER): Payer: Medicare Other | Admitting: Internal Medicine

## 2021-07-07 VITALS — BP 140/62 | HR 61 | Temp 98.3°F | Ht 65.0 in | Wt 217.0 lb

## 2021-07-07 DIAGNOSIS — M5416 Radiculopathy, lumbar region: Secondary | ICD-10-CM

## 2021-07-07 MED ORDER — GABAPENTIN 100 MG PO CAPS: 100.0000 mg | ORAL_CAPSULE | Freq: Every day | ORAL | 1 refills | Status: DC

## 2021-07-07 NOTE — Patient Instructions (Addendum)
    The pain in your right leg is likely coming from your back - a pinched nerve.     Medications changes include :   stop the meloxicam.  Start gabapentin 100-200 mg at night.   Your prescription(s) have been submitted to your pharmacy. Please take as directed and contact our office if you believe you are having problem(s) with the medication(s).   Follow up if your pain is not getting better.

## 2021-07-07 NOTE — Progress Notes (Signed)
Subjective:    Patient ID: Jasmin [name], female    DOB: 19-Apr-1934, 87 y.o.   MRN: [id number]  This visit occurred during the SARS-CoV-2 public health emergency.  Safety protocols were in place, including screening questions prior to the visit, additional usage of staff PPE, and extensive cleaning of exam room while observing appropriate contact time as indicated for disinfecting solutions.    HPI The patient is here for an acute visit.   Right leg pain - it started about 10 days.  She went to urgent care just over one week ago.   The pain started when she woke up.  She denies unusual activities.  The pain started in her lateral right lower leg - it is a sharp pain.  She does not have any N/T.  She was prescribed meloxciam 7.5 mg daily and voltaren gel at urgent. She denies swelling. She denied upper leg pain.  Yesterday she had some lower back pain on the right side - she thought that was her kidney.  She thinks the meloxicam is causing her to urinate more.   The pain is worse when she is laying in bed.  Right now she denies any pain.  Over the past few days the pain has gotten better, but it still present.    Medications and allergies reviewed with patient and updated if appropriate.  Patient Active Problem List   Diagnosis Date Noted   Hyperglycemia 01/27/2021   Aortic atherosclerosis ([place]) 11/10/2020   Nocturia 01/28/2020   RLQ abdominal pain 06/07/2019   Bilateral primary osteoarthritis of knee 09/13/2018   Constipation 07/06/2018   Peripheral neuropathic pain 12/28/2017   Gout 08/30/2017   History of colonic polyps 08/13/2016   Lipoma of arm 09/30/2014   WBC decreased 05/22/2013   Low back pain 11/16/2012   Obesity 05/31/2012   Dyspnea 03/10/2012   Edema 03/10/2012   Memory problem 11/24/2011   Internal hemorrhoid 05/20/2011   Well adult exam 11/17/2010   ONYCHOMYCOSIS 06/10/2009   DYSPHAGIA 09/10/2008   B12 deficiency 04/15/2008   ESOPHAGEAL STRICTURE  12/05/2007   Hiatal hernia 12/05/2007   FRACTURE, RIGHT LEG 12/05/2007   NEPHROLITHIASIS, HX OF 12/05/2007   PARESTHESIA 09/22/2007   GERD 08/09/2007   Vitamin D deficiency 03/20/2007   Dyslipidemia 03/20/2007   Essential hypertension 03/20/2007   DIVERTICULOSIS, COLON 03/20/2007    Current Outpatient Medications on File Prior to Visit  Medication Sig Dispense Refill   acetaminophen (TYLENOL) 500 MG tablet Take 1 tablet (500 mg total) by mouth every 6 (six) hours as needed. 30 tablet 0   Cholecalciferol 1000 units tablet Take 2 tablets (2,000 Units total) by mouth daily. 100 tablet 5   colchicine 0.6 MG tablet Take two tablets prn gout attack.Then take another one in 1-2 hrs. Do not repeat for 3 days. 18 tablet 1   Cyanocobalamin (VITAMIN B-12 PO) Take by mouth.     diclofenac Sodium (VOLTAREN) 1 % GEL Apply 2 g topically 4 (four) times daily. 100 g 0   famotidine (PEPCID) 40 MG tablet Take 1 tablet (40 mg total) by mouth daily. 90 tablet 3   hydrocortisone (ANUSOL-HC) 2.5 % rectal cream Place 1 application rectally at bedtime as needed for hemorrhoids or anal itching. 28 g 1   lovastatin (MEVACOR) 40 MG tablet Take 1 tablet (40 mg total) by mouth at bedtime. 90 tablet 3   metoprolol tartrate (LOPRESSOR) 50 MG tablet Take 1 tablet (50 mg total) by mouth 2 (two)  times daily. 180 tablet 3   oxybutynin (DITROPAN) 5 MG tablet TAKE 1 TABLET(5 MG) BY MOUTH AT BEDTIME 30 tablet 5   polyethylene glycol powder (GLYCOLAX/MIRALAX) powder Take 17 g by mouth 2 (two) times daily as needed for mild constipation, moderate constipation or severe constipation. (Patient taking differently: Take 17 g by mouth as needed for mild constipation, moderate constipation or severe constipation.) 500 g 3   spironolactone (ALDACTONE) 50 MG tablet Take 1 tablet (50 mg total) by mouth daily. 90 tablet 3   No current facility-administered medications on file prior to visit.    Past Medical History:  Diagnosis Date    Contact dermatitis and other eczema, due to unspecified cause    Diverticulosis of colon    Esophageal stricture    GERD with stricture    Dr Henrene Pastor   Gout    Hemorrhoids    Hiatal hernia    Hyperlipidemia    Hypertension    Onychomycosis    Personal history of urinary calculi    Polycystic kidney    Tubulovillous adenoma polyp of colon    Unspecified vitamin D deficiency    Vitamin B12 deficiency 2009   Borderline    Past Surgical History:  Procedure Laterality Date   CATARACT EXTRACTION Bilateral 08/24/2015   COLONOSCOPY     DILATION AND CURETTAGE OF UTERUS  08/24/1975   TOE SURGERY  08/23/2010   Right foot    TONSILLECTOMY     as child    Social History   Socioeconomic History   Marital status: Married    Spouse name: Not on file   Number of children: 2   Years of education: Not on file   Highest education level: Not on file  Occupational History   Occupation: RETIRED  Tobacco Use   Smoking status: Never   Smokeless tobacco: Never  Vaping Use   Vaping Use: Never used  Substance and Sexual Activity   Alcohol use: No    Alcohol/week: 0.0 standard drinks   Drug use: No   Sexual activity: Not Currently  Other Topics Concern   Not on file  Social History Narrative   GYN: Dr Philis Pique      FAMILY HISTORY HYPERTENSION      Regular exercise - NO   Social Determinants of Health   Financial Resource Strain: Low Risk    Difficulty of Paying Living Expenses: Not hard at all  Food Insecurity: No Food Insecurity   Worried About Charity fundraiser in the Last Year: Never true   Arboriculturist in the Last Year: Never true  Transportation Needs: No Transportation Needs   Lack of Transportation (Medical): No   Lack of Transportation (Non-Medical): No  Physical Activity: Sufficiently Active   Days of Exercise per Week: 3 days   Minutes of Exercise per Session: 60 min  Stress: No Stress Concern Present   Feeling of Stress : Only a little  Social Connections:  Engineer, building services of Communication with Friends and Family: Twice a week   Frequency of Social Gatherings with Friends and Family: Twice a week   Attends Religious Services: More than 4 times per year   Active Member of [employer] or Organizations: Yes   Attends Music therapist: More than 4 times per year   Marital Status: Married    Family History  Problem Relation Age of Onset   Hypertension Father    Colon cancer Neg Hx  Esophageal cancer Neg Hx    Rectal cancer Neg Hx    Stomach cancer Neg Hx     Review of Systems  Constitutional:  Negative for chills and fever.  Genitourinary:  Negative for dysuria and hematuria.  Musculoskeletal:  Positive for back pain (right lower back yesterday).      Objective:   Vitals:   07/07/21 0752  BP: 140/62  Pulse: 61  Temp: 98.3 F (36.8 C)  SpO2: 99%   BP Readings from Last 3 Encounters:  07/07/21 140/62  07/03/21 132/76  06/29/21 135/76   Wt Readings from Last 3 Encounters:  07/07/21 217 lb (98.4 kg)  07/03/21 226 lb (102.5 kg)  02/18/21 218 lb 12.8 oz (99.2 kg)   Body mass index is 36.11 kg/m.   Physical Exam Constitutional:      General: She is not in acute distress.    Appearance: Normal appearance. She is not ill-appearing.  Abdominal:     Tenderness: There is no right CVA tenderness or left CVA tenderness.  Musculoskeletal:        General: No tenderness (lower back pain).     Right lower leg: No edema.     Left lower leg: No edema.  Skin:    General: Skin is warm and dry.  Neurological:     Mental Status: She is alert.     Sensory: No sensory deficit.     Motor: No weakness.     Comments: Neg straight leg raise           Assessment & Plan:    Acute right-sided radiculopathy: Acute-started 10 days ago Symptoms consistent with radiculopathy pain in the right lower leg.  She also had some right lower back pain yesterday Discussed cause of her pain and treatment options Stop  meloxicam-given her CKD do not want her taking this Start gabapentin 100-200 mg at night-she has taken this in the past and most of her pain is at night Discussed avoiding bending, lifting and twisting Discussed that physical therapy may be of benefit and if she is not seeing continued improvement she should let us know so I can refer her Back exercises given Further evaluation if she is not improving

## 2021-07-17 ENCOUNTER — Encounter (HOSPITAL_COMMUNITY): Payer: Self-pay | Admitting: *Deleted

## 2021-07-17 ENCOUNTER — Emergency Department (HOSPITAL_COMMUNITY)
Admission: EM | Admit: 2021-07-17 | Discharge: 2021-07-17 | Disposition: A | Discharge status: Home / Self Care | Payer: Medicare Other | Attending: Emergency Medicine | Admitting: Emergency Medicine

## 2021-07-17 ENCOUNTER — Other Ambulatory Visit: Payer: Self-pay

## 2021-07-17 DIAGNOSIS — R040 Epistaxis: Secondary | ICD-10-CM | POA: Insufficient documentation

## 2021-07-17 DIAGNOSIS — Z79899 Other long term (current) drug therapy: Secondary | ICD-10-CM | POA: Diagnosis not present

## 2021-07-17 DIAGNOSIS — I10 Essential (primary) hypertension: Secondary | ICD-10-CM | POA: Diagnosis not present

## 2021-07-17 LAB — BASIC METABOLIC PANEL
Anion gap: 7 (ref 5–15)
BUN: 20 mg/dL (ref 8–23)
CO2: 23 mmol/L (ref 22–32)
Calcium: 9.6 mg/dL (ref 8.9–10.3)
Chloride: 108 mmol/L (ref 98–111)
Creatinine, Ser: 1.35 mg/dL — ABNORMAL HIGH (ref 0.44–1.00)
GFR, Estimated: 39 mL/min — ABNORMAL LOW (ref >60)
Glucose, Bld: 105 mg/dL — ABNORMAL HIGH (ref 70–99)
Potassium: 4.1 mmol/L (ref 3.5–5.1)
Sodium: 138 mmol/L (ref 135–145)

## 2021-07-17 LAB — CBC WITH DIFFERENTIAL/PLATELET
Abs Immature Granulocytes: 0.03 10*3/uL (ref 0.00–0.07)
Basophils Absolute: 0 10*3/uL (ref 0.0–0.1)
Basophils Relative: 1 %
Eosinophils Absolute: 0 10*3/uL (ref 0.0–0.5)
Eosinophils Relative: 1 %
HCT: 39.8 % (ref 36.0–46.0)
Hemoglobin: 12.9 g/dL (ref 12.0–15.0)
Immature Granulocytes: 1 %
Lymphocytes Relative: 41 %
Lymphs Abs: 1.3 10*3/uL (ref 0.7–4.0)
MCH: 30.7 pg (ref 26.0–34.0)
MCHC: 32.4 g/dL (ref 30.0–36.0)
MCV: 94.8 fL (ref 80.0–100.0)
Monocytes Absolute: 0.3 10*3/uL (ref 0.1–1.0)
Monocytes Relative: 11 %
Neutro Abs: 1.4 10*3/uL — ABNORMAL LOW (ref 1.7–7.7)
Neutrophils Relative %: 45 %
Platelets: 76 10*3/uL — ABNORMAL LOW (ref 150–400)
RBC: 4.2 MIL/uL (ref 3.87–5.11)
RDW: 14.3 % (ref 11.5–15.5)
WBC: 3 10*3/uL — ABNORMAL LOW (ref 4.0–10.5)
nRBC: 0 % (ref 0.0–0.2)

## 2021-07-17 LAB — PROTIME-INR
INR: 1.1 (ref 0.8–1.2)
Prothrombin Time: 14.2 seconds (ref 11.4–15.2)

## 2021-07-17 MED ORDER — OXYMETAZOLINE HCL 0.05 % NA SOLN
1.0000 | Freq: Once | NASAL | Status: AC
  Administered 2021-07-17: 1 via NASAL
  Filled 2021-07-17: qty 30

## 2021-07-17 NOTE — ED Triage Notes (Signed)
Pt states she woke up to use the bathroom and her nose started bleeding.  Nose has not stopped bleeding since 1 am.

## 2021-07-17 NOTE — Discharge Instructions (Signed)
If bleeding recurs, perform 2 sprays of the Afrin nasal spray you were given this evening, then hold direct pressure for 15 minutes.  Return to the emergency department if bleeding recurs and you are unable to control with direct pressure.

## 2021-07-17 NOTE — ED Provider Notes (Signed)
[facility] Provider Note   CSN: [id number] Arrival date & time: 07/17/21  0224     History Chief Complaint  Patient presents with   Epistaxis    Jasmin [name] is a 87 y.o. female.  Patient is an 87 year old female with past medical history of hyperlipidemia, hypertension.  Patient presenting today with complaints of nosebleed.  She woke up from sleep this morning to go to the bathroom.  While she was walking into the bathroom, she noted something dripped onto her hand.  She looked down and it was blood.  She then realized the bleeding was coming from her nose.  She attempted to control it with direct pressure, however it has continued to bleed.  Patient denies any injury or trauma.  She does not take any blood thinners.  The history is provided by the patient.  Epistaxis Location:  L nare Severity:  Moderate Duration:  2 hours Timing:  Constant Progression:  Waxing and waning Chronicity:  New Context: not anticoagulants and not trauma   Relieved by:  Nothing Worsened by:  Nothing     Past Medical History:  Diagnosis Date   Contact dermatitis and other eczema, due to unspecified cause    Diverticulosis of colon    Esophageal stricture    GERD with stricture    Dr Henrene Pastor   Gout    Hemorrhoids    Hiatal hernia    Hyperlipidemia    Hypertension    Onychomycosis    Personal history of urinary calculi    Polycystic kidney    Tubulovillous adenoma polyp of colon    Unspecified vitamin D deficiency    Vitamin B12 deficiency 2009   Borderline    Patient Active Problem List   Diagnosis Date Noted   Hyperglycemia 01/27/2021   Aortic atherosclerosis ([place]) 11/10/2020   Nocturia 01/28/2020   RLQ abdominal pain 06/07/2019   Bilateral primary osteoarthritis of knee 09/13/2018   Constipation 07/06/2018   Peripheral neuropathic pain 12/28/2017   Gout 08/30/2017   History of colonic polyps 08/13/2016   Lipoma of arm 09/30/2014    WBC decreased 05/22/2013   Low back pain 11/16/2012   Obesity 05/31/2012   Dyspnea 03/10/2012   Edema 03/10/2012   Memory problem 11/24/2011   Internal hemorrhoid 05/20/2011   Well adult exam 11/17/2010   ONYCHOMYCOSIS 06/10/2009   DYSPHAGIA 09/10/2008   B12 deficiency 04/15/2008   ESOPHAGEAL STRICTURE 12/05/2007   Hiatal hernia 12/05/2007   FRACTURE, RIGHT LEG 12/05/2007   NEPHROLITHIASIS, HX OF 12/05/2007   PARESTHESIA 09/22/2007   GERD 08/09/2007   Vitamin D deficiency 03/20/2007   Dyslipidemia 03/20/2007   Essential hypertension 03/20/2007   DIVERTICULOSIS, COLON 03/20/2007    Past Surgical History:  Procedure Laterality Date   CATARACT EXTRACTION Bilateral 08/24/2015   COLONOSCOPY     DILATION AND CURETTAGE OF UTERUS  08/24/1975   TOE SURGERY  08/23/2010   Right foot    TONSILLECTOMY     as child     OB History   No obstetric history on file.     Family History  Problem Relation Age of Onset   Hypertension Father    Colon cancer Neg Hx    Esophageal cancer Neg Hx    Rectal cancer Neg Hx    Stomach cancer Neg Hx     Social History   Tobacco Use   Smoking status: Never   Smokeless tobacco: Never  Vaping Use   Vaping Use: Never  used  Substance Use Topics   Alcohol use: No    Alcohol/week: 0.0 standard drinks   Drug use: No    Home Medications Prior to Admission medications   Medication Sig Start Date End Date Taking? Authorizing Provider  acetaminophen (TYLENOL) 500 MG tablet Take 1 tablet (500 mg total) by mouth every 6 (six) hours as needed. 07/25/20   Zigmund Gottron, NP  Cholecalciferol 1000 units tablet Take 2 tablets (2,000 Units total) by mouth daily. 10/06/15   Plotnikov, Evie Lacks, MD  colchicine 0.6 MG tablet Take two tablets prn gout attack.Then take another one in 1-2 hrs. Do not repeat for 3 days. 03/08/19   Plotnikov, Evie Lacks, MD  Cyanocobalamin (VITAMIN B-12 PO) Take by mouth.    [provider]  diclofenac Sodium  (VOLTAREN) 1 % GEL Apply 2 g topically 4 (four) times daily. 06/29/21   Hans Eden, NP  famotidine (PEPCID) 40 MG tablet Take 1 tablet (40 mg total) by mouth daily. 01/27/21   Plotnikov, Evie Lacks, MD  gabapentin (NEURONTIN) 100 MG capsule Take 1-2 capsules (100-200 mg total) by mouth at bedtime. 07/07/21   Binnie Rail, MD  hydrocortisone (ANUSOL-HC) 2.5 % rectal cream Place 1 application rectally at bedtime as needed for hemorrhoids or anal itching. 02/18/21   Irene Shipper, MD  lovastatin (MEVACOR) 40 MG tablet Take 1 tablet (40 mg total) by mouth at bedtime. 01/27/21   Plotnikov, Evie Lacks, MD  metoprolol tartrate (LOPRESSOR) 50 MG tablet Take 1 tablet (50 mg total) by mouth 2 (two) times daily. 01/27/21   Plotnikov, Evie Lacks, MD  oxybutynin (DITROPAN) 5 MG tablet TAKE 1 TABLET(5 MG) BY MOUTH AT BEDTIME 07/21/20   Plotnikov, Evie Lacks, MD  polyethylene glycol powder (GLYCOLAX/MIRALAX) powder Take 17 g by mouth 2 (two) times daily as needed for mild constipation, moderate constipation or severe constipation. Patient taking differently: Take 17 g by mouth as needed for mild constipation, moderate constipation or severe constipation. 07/06/18   Plotnikov, Evie Lacks, MD  spironolactone (ALDACTONE) 50 MG tablet Take 1 tablet (50 mg total) by mouth daily. 01/27/21   Plotnikov, Evie Lacks, MD    Allergies    Allopurinol, Doxycycline, Hctz [hydrochlorothiazide], Omeprazole, Oxybutynin, Sulfonamide derivatives, Clarithromycin, and Penicillins  Review of Systems   Review of Systems  HENT:  Positive for nosebleeds.   All other systems reviewed and are negative.  Physical Exam Updated Vital Signs BP (!) 148/73   Pulse 67   Temp 98.1 F (36.7 C) (Oral)   Resp 16   Ht 5\' 5"  (1.651 m)   Wt 98.4 kg   SpO2 94%   BMI 36.11 kg/m   Physical Exam Vitals and nursing note reviewed.  Constitutional:      General: She is not in acute distress.    Appearance: Normal appearance. She is not  ill-appearing.  HENT:     Head: Normocephalic and atraumatic.     Nose:     Comments: The left nares has old blood, but no active bleeding.  The right nares is clear.  Septum is midline. Pulmonary:     Effort: Pulmonary effort is normal.  Skin:    General: Skin is warm and dry.  Neurological:     Mental Status: She is alert.    ED Results / Procedures / Treatments   Labs (all labs ordered are listed, but only abnormal results are displayed) Labs Reviewed - No data to display  EKG None  Radiology  No results found.  Procedures Procedures   Medications Ordered in ED Medications  oxymetazoline (AFRIN) 0.05 % nasal spray 1 spray (has no administration in time range)    ED Course  I have reviewed the triage vital signs and the nursing notes.  Pertinent labs & imaging results that were available during my care of the patient were reviewed by me and considered in my medical decision making (see chart for details).    MDM Rules/Calculators/A&P  Patient presenting here with complaints of nosebleed as described in the HPI.  Patient's bleeding had resolved by the time of my evaluation.  She has dried blood in the left nares, but no active bleeding.  This appears to have been an anterior nosebleed.  She was given Afrin nasal spray and has held pressure for 15 minutes.  Patient has been observed for over 2 hours and has had no further bleeding.  Her laboratory studies show a platelet count of 86.  While this is low, I do not feel as though this has led to her nosebleed or low enough for spontaneous bleeding.  Remainder of laboratory studies unremarkable.  At this point, I feel as though patient can safely be discharged with Afrin nasal spray and direct pressure if nosebleed recurs.  Final Clinical Impression(s) / ED Diagnoses Final diagnoses:  None    Rx / DC Orders ED Discharge Orders     None        Veryl Speak, MD 07/17/21 [phone]

## 2021-07-17 NOTE — ED Notes (Signed)
Dr. Delo at bedside. 

## 2021-07-20 ENCOUNTER — Telehealth: Payer: Self-pay | Admitting: Internal Medicine

## 2021-07-20 NOTE — Telephone Encounter (Signed)
[facility] FYI 11.24.22...  Having pain in right leg. Was seen on monday and PCP told her it was due to nerve issues in back.   Advised to see PCP within 3 days. Caller understood but chose to go to the ED instead.

## 2021-07-20 NOTE — Telephone Encounter (Signed)
Caller states she spoke with nurse and was informed to take Tylenol for leg pain. States it had stopped hurting but hurts when she gets up to walk. States she needs to see doctor.

## 2021-07-20 NOTE — Telephone Encounter (Signed)
Patient calling in  Seen Dr. Quay Burow on 11/15 for some right leg pain  Patient says she is still having leg pain (mostly in the early afternoon around 12pm)  Patient is currently taking 2 gabapentin nightly at bedtime  Wants to know if she can take 1 in tha afternoon when the pain starts & the other 1 at bedtime or what other options she has available   Please call [phone]

## 2021-07-21 ENCOUNTER — Telehealth: Payer: Self-pay | Admitting: Internal Medicine

## 2021-07-21 NOTE — Telephone Encounter (Signed)
Spoke with patient today and info given about increasing.

## 2021-07-21 NOTE — Telephone Encounter (Signed)
Pt. Called and states she has recently had a nose bleed and had to go to the hospital to get it to stop. They recommended that she see an ENT, requesting that provider refer her to see one.    Please advise.    Callback #- [id number]

## 2021-07-21 NOTE — Telephone Encounter (Signed)
Pt must see PCP first prior to getting referral due to insurance. Pt is schedule for Dec 7th for appt. MD will do at that time.Marland KitchenJohny [name]

## 2021-07-21 NOTE — Telephone Encounter (Signed)
She can try adding 1 pill in the morning and then 1 pill in the afternoon along with the 2 pills at bedtime.  Please remind her this medication can make her drowsy so she should not drive while she is taking until she sees how she feels with it.  If it causes drowsiness then she should just take it at bedtime.  She can follow-up with Dr. Alain Marion next week at their appointment or should call sooner with any questions

## 2021-07-27 ENCOUNTER — Other Ambulatory Visit: Payer: Self-pay | Admitting: Internal Medicine

## 2021-07-27 MED ORDER — GABAPENTIN 100 MG PO CAPS: 100.0000 mg | ORAL_CAPSULE | Freq: Every day | ORAL | 3 refills | Status: DC

## 2021-07-29 ENCOUNTER — Ambulatory Visit (INDEPENDENT_AMBULATORY_CARE_PROVIDER_SITE_OTHER): Payer: Medicare Other | Admitting: Internal Medicine

## 2021-07-29 ENCOUNTER — Encounter: Payer: Self-pay | Admitting: Internal Medicine

## 2021-07-29 ENCOUNTER — Other Ambulatory Visit: Payer: Self-pay

## 2021-07-29 VITALS — BP 138/62 | HR 59 | Temp 98.2°F | Ht 65.0 in | Wt 222.0 lb

## 2021-07-29 DIAGNOSIS — R040 Epistaxis: Secondary | ICD-10-CM | POA: Insufficient documentation

## 2021-07-29 NOTE — Progress Notes (Signed)
Subjective:  Patient ID: Jasmin [name], female    DOB: Apr 29, 1934  Age: 87 y.o. MRN: [id number]  CC: Follow-up (6 month f/u- Req referral to ENT for nosebleed. Pt states she had to go to the ER last week for nosebleed)   HPI Jasmin [name] presents for L nostril bleed on 11/25 - pt went to ER and once more. Using Afrin prn . Outpatient Medications Prior to Visit  Medication Sig Dispense Refill   acetaminophen (TYLENOL) 500 MG tablet Take 1 tablet (500 mg total) by mouth every 6 (six) hours as needed. (Patient taking differently: Take 1,000 mg by mouth every 6 (six) hours as needed for moderate pain or headache.) 30 tablet 0   colchicine 0.6 MG tablet Take two tablets prn gout attack.Then take another one in 1-2 hrs. Do not repeat for 3 days. (Patient taking differently: Take 0.6 mg by mouth as needed (gout flares).) 18 tablet 1   Cyanocobalamin (VITAMIN B-12 PO) Take 1,000 mcg by mouth daily.     famotidine (PEPCID) 40 MG tablet Take 1 tablet (40 mg total) by mouth daily. 90 tablet 3   gabapentin (NEURONTIN) 100 MG capsule Take 1-2 capsules (100-200 mg total) by mouth at bedtime. 60 capsule 3   hydrocortisone (ANUSOL-HC) 2.5 % rectal cream Place 1 application rectally at bedtime as needed for hemorrhoids or anal itching. 28 g 1   lovastatin (MEVACOR) 40 MG tablet Take 1 tablet (40 mg total) by mouth at bedtime. 90 tablet 3   metoprolol tartrate (LOPRESSOR) 50 MG tablet Take 1 tablet (50 mg total) by mouth 2 (two) times daily. 180 tablet 3   polyethylene glycol powder (GLYCOLAX/MIRALAX) powder Take 17 g by mouth 2 (two) times daily as needed for mild constipation, moderate constipation or severe constipation. (Patient taking differently: Take 17 g by mouth as needed for mild constipation, moderate constipation or severe constipation.) 500 g 3   spironolactone (ALDACTONE) 50 MG tablet Take 1 tablet (50 mg total) by mouth daily. 90 tablet 3   Cholecalciferol 1000 units tablet Take 2  tablets (2,000 Units total) by mouth daily. (Patient not taking: Reported on 07/17/2021) 100 tablet 5   diclofenac Sodium (VOLTAREN) 1 % GEL Apply 2 g topically 4 (four) times daily. (Patient not taking: Reported on 07/17/2021) 100 g 0   oxybutynin (DITROPAN) 5 MG tablet TAKE 1 TABLET(5 MG) BY MOUTH AT BEDTIME (Patient not taking: Reported on 07/17/2021) 30 tablet 5   No facility-administered medications prior to visit.    ROS: Review of Systems  Constitutional:  Negative for activity change, appetite change, chills, fatigue and unexpected weight change.  HENT:  Positive for nosebleeds. Negative for congestion, mouth sores and sinus pressure.   Eyes:  Negative for visual disturbance.  Respiratory:  Negative for cough and chest tightness.   Gastrointestinal:  Negative for abdominal pain and nausea.  Genitourinary:  Negative for difficulty urinating, frequency and vaginal pain.  Musculoskeletal:  Negative for back pain and gait problem.  Skin:  Negative for pallor and rash.  Neurological:  Negative for dizziness, tremors, weakness, light-headedness, numbness and headaches.  Psychiatric/Behavioral:  Negative for confusion and sleep disturbance.    Objective:  BP 138/62 (BP Location: Left Arm)   Pulse (!) 59   Temp 98.2 F (36.8 C) (Oral)   Ht 5\' 5"  (1.651 m)   Wt 222 lb (100.7 kg)   SpO2 97%   BMI 36.94 kg/m   BP Readings from Last 3 Encounters:  07/29/21  138/62  07/17/21 (!) 106/58  07/07/21 140/62    Wt Readings from Last 3 Encounters:  07/29/21 222 lb (100.7 kg)  07/17/21 217 lb (98.4 kg)  07/07/21 217 lb (98.4 kg)    Physical Exam Constitutional:      General: She is not in acute distress.    Appearance: She is well-developed.  HENT:     Head: Normocephalic.     Right Ear: External ear normal.     Left Ear: External ear normal.     Nose: Nose normal.  Eyes:     General:        Right eye: No discharge.        Left eye: No discharge.     Conjunctiva/sclera:  Conjunctivae normal.     Pupils: Pupils are equal, round, and reactive to light.  Neck:     Thyroid: No thyromegaly.     Vascular: No JVD.     Trachea: No tracheal deviation.  Cardiovascular:     Rate and Rhythm: Normal rate and regular rhythm.     Heart sounds: Normal heart sounds.  Pulmonary:     Effort: No respiratory distress.     Breath sounds: No stridor. No wheezing.  Abdominal:     General: Bowel sounds are normal. There is no distension.     Palpations: Abdomen is soft. There is no mass.     Tenderness: There is no abdominal tenderness. There is no guarding or rebound.  Musculoskeletal:        General: No tenderness.     Cervical back: Normal range of motion and neck supple. No rigidity.  Lymphadenopathy:     Cervical: No cervical adenopathy.  Skin:    Findings: No erythema or rash.  Neurological:     Cranial Nerves: No cranial nerve deficit.     Motor: No abnormal muscle tone.     Coordination: Coordination normal.     Deep Tendon Reflexes: Reflexes normal.  Psychiatric:        Behavior: Behavior normal.        Thought Content: Thought content normal.        Judgment: Judgment normal.   Mucus in B nares L>R, no blood now  Lab Results  Component Value Date   WBC 3.0 (L) 07/17/2021   HGB 12.9 07/17/2021   HCT 39.8 07/17/2021   PLT 76 (L) 07/17/2021   GLUCOSE 105 (H) 07/17/2021   CHOL 169 07/29/2020   TRIG 84.0 07/29/2020   HDL 61.70 07/29/2020   LDLDIRECT 139.3 05/31/2012   LDLCALC 91 07/29/2020   ALT 11 01/27/2021   AST 15 01/27/2021   NA 138 07/17/2021   K 4.1 07/17/2021   CL 108 07/17/2021   CREATININE 1.35 (H) 07/17/2021   BUN 20 07/17/2021   CO2 23 07/17/2021   TSH 1.56 07/29/2020   INR 1.1 07/17/2021   HGBA1C 6.0 01/27/2021    No results found.  Assessment & Plan:   Problem List Items Addressed This Visit     Epistaxis - Primary    Recurrent L nostril bleed on 11/25 - pt went to ER and once more. Use Afrin prn Use saline spray  ENT  consult      Relevant Orders   Ambulatory referral to ENT      No orders of the defined types were placed in this encounter.     Follow-up: No follow-ups on file.  Walker Kehr, MD

## 2021-07-29 NOTE — Assessment & Plan Note (Signed)
Recurrent L nostril bleed on 11/25 - pt went to ER and once more. Use Afrin prn Use saline spray  ENT consult

## 2021-07-29 NOTE — Patient Instructions (Signed)
Nosebleed, Adult A nosebleed is when blood comes out of the nose. Nosebleeds are common. Usually, they are not a sign of a serious condition. Nosebleeds can happen if a blood vessel in your nose starts to bleed or if the lining of your nose (mucous membrane) cracks. They are commonly caused by: Allergies. Colds. Picking your nose. Blowing your nose too hard. An injury from sticking an object into your nose or getting hit in the nose. Dry or cold air. Less common causes of nosebleeds include: Toxic fumes. Something abnormal in the nose or in the air-filled spaces in the bones of the face (sinuses). Growths in the nose, such as polyps. Blood thinners or conditions that cause blood to clot slowly. Certain illnesses or procedures that irritate or dry out the nasal passages. Follow these instructions at home: When you have a nosebleed:  Sit down and tilt your head slightly forward. Use a clean towel or tissue to pinch your nostrils under the bony part of your nose. After 5 minutes, let go of your nose and see if bleeding starts again. Do not release pressure before that time. If there is still bleeding, repeat the pinching and holding for 5 minutes or until the bleeding stops. Do not place tissues or gauze in the nose to stop the bleeding. Avoid lying down and avoid tilting your head backward. That may make blood collect in the throat and cause gagging or coughing. Use a nasal spray decongestant to help with a nosebleed as told by your health care provider. After a nosebleed: Avoid blowing your nose or sniffing for a number of hours. Avoid straining, lifting, or bending at the waist for several days. You may go back to other normal activities as you are able. If you are taking aspirin or blood thinners and you have nosebleeds, talk to your health care provider. These medicines make bleeding more likely. Ask your health care provider if you should stop taking the medicines or if you should  adjust the dose. Do not stop taking medicines that your health care provider has recommended unless he or she tells you to stop taking them. If your nosebleed was caused by dry mucous membranes, use over-the-counter saline nasal spray or gel and a humidifier as told by your health care provider. This will keep the mucous membranes moist and allow them to heal. If you need to use one of these products: Choose one that is water-soluble. Use only as much as you need and use it only as often as needed. Do not lie down right after you use it. If you get nosebleeds often, talk with your health care provider about medical treatments. Options may include: Nasal cautery. This treatment stops and prevents nosebleeds by using a chemical swab or electrical device to lightly burn tiny blood vessels inside the nose. Nasal packing. A gauze or other material is placed in the nose to keep constant pressure on the bleeding area. Contact a health care provider if you: Have a fever. Get nosebleeds often or more often than usual. Bruise very easily. Have a nosebleed from having something stuck in your nose. Have bleeding in your mouth. Vomit or cough up brown material. Have a nosebleed after you start a new medicine. Get help right away if: You have a nosebleed after a fall or a head injury. Your nosebleed does not go away after 20 minutes. You feel dizzy or weak. You have unusual bleeding from other parts of your body. You have unusual bruising on   other parts of your body. You become sweaty. You vomit blood. Summary A nosebleed is when blood comes out of the nose. Common causes include allergies, an injury to the nose, or cold or dry air. Initial treatment includes applying pressure for 5 minutes. Moisturizing the nose with saline nasal spray or gel after a nosebleed may help prevent future bleeding. Get help right away if your nosebleed does not go away after 20 minutes. This information is not intended  to replace advice given to you by your health care provider. Make sure you discuss any questions you have with your health care provider. Document Revised: 06/07/2019 Document Reviewed: 06/07/2019 [employer]vier Patient Education  2022 [employer]  

## 2021-11-11 ENCOUNTER — Other Ambulatory Visit: Payer: Self-pay | Admitting: Internal Medicine

## 2021-11-19 DIAGNOSIS — H02831 Dermatochalasis of right upper eyelid: Secondary | ICD-10-CM | POA: Diagnosis not present

## 2021-11-19 DIAGNOSIS — H02834 Dermatochalasis of left upper eyelid: Secondary | ICD-10-CM | POA: Diagnosis not present

## 2021-11-19 DIAGNOSIS — H04123 Dry eye syndrome of bilateral lacrimal glands: Secondary | ICD-10-CM | POA: Diagnosis not present

## 2021-11-19 DIAGNOSIS — Z961 Presence of intraocular lens: Secondary | ICD-10-CM | POA: Diagnosis not present

## 2021-12-26 ENCOUNTER — Other Ambulatory Visit: Payer: Self-pay | Admitting: Internal Medicine

## 2022-01-08 NOTE — Progress Notes (Signed)
01/11/2022 Jasmin [name] [id number] [birth year]  Referring provider: Cassandria Anger, MD Primary GI doctor: Dr. Henrene Pastor  ASSESSMENT AND PLAN:   Rectal bleeding Has follow up with PCP in 1-2 months, suggest checking CBC there -Sitz baths, increase fiber, increase water, need to fix toilet habits.  - add on squatty potty, avoid straining  Diverticulosis of colon Will call if any symptoms. Add on fiber supplement, avoid NSAIDS, information given  Follow up as needed.   History of Present Illness:  88 y.o. female  with a past medical history of hypertension, B12 deficiency, GERD, with esophageal stricture, diverticulosis, internal hemorrhoids and others listed below, returns to clinic today for evaluation of blood in stool.  03/19/2017 colonoscopy excellent bowel prep 2 adenomatous polyps transverse colon 2 mm in size, diverticulosis, internal hemorrhoids no repeat colonoscopy recommended secondary to age 64/11/2013 colonoscopy 2 diminutive adenomatous polyps recall 3 years  She states Monday morning on the 10th, she had a BM and looked down and saw BRB in the water. No rectal pain.  Used the cream at home once, has not seen any further blood.  She has been taking metamucil and miralax x 2 years, has always had good BMs. She has been having a BM twice a day, has been happening the the metamucil.  Denies AB pain. Denies nausea or vomiting.  No weight loss.  No NSAIDS, ETOH.  Has high rise toilets at home.    Current Medications:    Current Outpatient Medications (Cardiovascular):    lovastatin (MEVACOR) 40 MG tablet, TAKE 1 TABLET BY MOUTH AT  BEDTIME   metoprolol tartrate (LOPRESSOR) 50 MG tablet, Take 1 tablet (50 mg total) by mouth 2 (two) times daily. Annual appt due w/labs must see provider for future refills   spironolactone (ALDACTONE) 50 MG tablet, TAKE 1 TABLET BY MOUTH  DAILY   Current Outpatient Medications (Analgesics):    acetaminophen (TYLENOL) 500  MG tablet, Take 1 tablet (500 mg total) by mouth every 6 (six) hours as needed. (Patient taking differently: Take 1,000 mg by mouth every 6 (six) hours as needed for moderate pain or headache.)  Current Outpatient Medications (Hematological):    Cyanocobalamin (VITAMIN B-12 PO), Take 1,000 mcg by mouth daily.  Current Outpatient Medications (Other):    famotidine (PEPCID) 40 MG tablet, TAKE 1 TABLET BY MOUTH  DAILY   gabapentin (NEURONTIN) 100 MG capsule, Take 1-2 capsules (100-200 mg total) by mouth at bedtime.   hydrocortisone (ANUSOL-HC) 2.5 % rectal cream, Place 1 application rectally at bedtime as needed for hemorrhoids or anal itching.   polyethylene glycol powder (GLYCOLAX/MIRALAX) powder, Take 17 g by mouth 2 (two) times daily as needed for mild constipation, moderate constipation or severe constipation. (Patient taking differently: Take 17 g by mouth as needed for mild constipation, moderate constipation or severe constipation.)  Surgical History:  She  has a past surgical history that includes Tonsillectomy; Dilation and curettage of uterus (08/24/1975); Toe Surgery (08/23/2010); Cataract extraction (Bilateral, 08/24/2015); and Colonoscopy. Family History:  Her family history includes Hypertension in her father. Social History:   reports that she has never smoked. She has never used smokeless tobacco. She reports that she does not drink alcohol and does not use drugs.  Current Medications, Allergies, Past Medical History, Past Surgical History, Family History and Social History were reviewed in [employer] record.  Physical Exam: BP (!) 146/80   Pulse 60   Ht '5\' 6"'$  (1.676 m)   Wt 221  lb (100.2 kg)   BMI 35.67 kg/m  General:   Pleasant, well developed female in no acute distress Heart : Regular rate and rhythm; no murmurs Pulm: Clear anteriorly; no wheezing Abdomen:  Soft, Obese AB, Active bowel sounds. No tenderness . , No organomegaly  appreciated. Rectal: Skin tags, small possible prolapse of internal hemorrhoid, decreased rectal tone, appreciated internal hemorrhoids, nontender, no masses, , brown stool, hemoccult Negative Extremities:  without  edema. Neurologic:  Alert and  oriented x4;  No focal deficits.  Psych:  Cooperative. Normal mood and affect.   Vladimir Crofts, PA-C 01/11/22

## 2022-01-11 ENCOUNTER — Ambulatory Visit: Payer: Medicare Other | Admitting: Physician Assistant

## 2022-01-11 ENCOUNTER — Encounter: Payer: Self-pay | Admitting: Physician Assistant

## 2022-01-11 VITALS — BP 146/80 | HR 60 | Ht 66.0 in | Wt 221.0 lb

## 2022-01-11 DIAGNOSIS — K5730 Diverticulosis of large intestine without perforation or abscess without bleeding: Secondary | ICD-10-CM | POA: Diagnosis not present

## 2022-01-11 DIAGNOSIS — K625 Hemorrhage of anus and rectum: Secondary | ICD-10-CM | POA: Diagnosis not present

## 2022-01-11 NOTE — Progress Notes (Signed)
Full assessment and plans reviewed.

## 2022-01-11 NOTE — Patient Instructions (Addendum)
Continue metamucil and miralax Get a squatty potty to use at home or try a stool, goal is to get your knees above your hips during a bowel movement.  Miralax is an osmotic laxative.  It only brings more water into the stool.  This is safe to take daily.  Can take up to 17 gram of miralax twice a day.  Mix with juice or coffee.  Start 1 capful at night for 3-4 days and reassess your response in 3-4 days.  You can increase and decrease the dose based on your response.  Remember, it can take up to 3-4 days to take effect OR for the effects to wear off.   CAN ADD IN 1/2 CAPFUL DAILY RATHER THAN AS NEEDED  I often pair this with benefiber in the morning to help assure the stool is not too loose.    - Drink at least 64-80 ounces of water/liquid per day. - Establish a time to try to move your bowels every day.  For many people, this is after a cup of coffee or after a meal such as breakfast. - Sit all of the way back on the toilet keeping your back fairly straight and while sitting up, try to rest the tops of your forearms on your upper thighs.   - Raising your feet with a step stool/squatty potty can be helpful to improve the angle that allows your stool to pass through the rectum. - Relax the rectum feeling it bulge toward the toilet water.  If you feel your rectum raising toward your body, you are contracting rather than relaxing. - Breathe in and slowly exhale. "Belly breath" by expanding your belly towards your belly button. Keep belly expanded as you gently direct pressure down and back to the anus.  A low pitched GRRR sound can assist with increasing intra-abdominal pressure.  - Repeat 3-4 times. If unsuccessful, contract the pelvic floor to restore normal tone and get off the toilet.  Avoid excessive straining. - To reduce excessive wiping by teaching your anus to normally contract, place hands on outer aspect of knees and resist knee movement outward.  Hold 5-10 second then place hands just  inside of knees and resist inward movement of knees.  Hold 5 seconds.  Repeat a few times each way.  Go to the ER if unable to pass gas, severe AB pain, unable to hold down food, any shortness of breath of chest pain.

## 2022-02-14 ENCOUNTER — Emergency Department (HOSPITAL_COMMUNITY): Payer: Medicare Other

## 2022-02-14 ENCOUNTER — Encounter (HOSPITAL_COMMUNITY): Payer: Self-pay | Admitting: Pharmacy Technician

## 2022-02-14 ENCOUNTER — Other Ambulatory Visit: Payer: Self-pay

## 2022-02-14 ENCOUNTER — Emergency Department (HOSPITAL_COMMUNITY)
Admission: EM | Admit: 2022-02-14 | Discharge: 2022-02-14 | Disposition: A | Discharge status: Home / Self Care | Payer: Medicare Other | Attending: Emergency Medicine | Admitting: Emergency Medicine

## 2022-02-14 DIAGNOSIS — K449 Diaphragmatic hernia without obstruction or gangrene: Secondary | ICD-10-CM | POA: Diagnosis not present

## 2022-02-14 DIAGNOSIS — K5730 Diverticulosis of large intestine without perforation or abscess without bleeding: Secondary | ICD-10-CM | POA: Diagnosis not present

## 2022-02-14 DIAGNOSIS — X58XXXA Exposure to other specified factors, initial encounter: Secondary | ICD-10-CM | POA: Diagnosis not present

## 2022-02-14 DIAGNOSIS — S29012A Strain of muscle and tendon of back wall of thorax, initial encounter: Secondary | ICD-10-CM | POA: Diagnosis not present

## 2022-02-14 DIAGNOSIS — S29019A Strain of muscle and tendon of unspecified wall of thorax, initial encounter: Secondary | ICD-10-CM

## 2022-02-14 DIAGNOSIS — S3992XA Unspecified injury of lower back, initial encounter: Secondary | ICD-10-CM | POA: Diagnosis present

## 2022-02-14 DIAGNOSIS — R109 Unspecified abdominal pain: Secondary | ICD-10-CM | POA: Insufficient documentation

## 2022-02-14 DIAGNOSIS — N200 Calculus of kidney: Secondary | ICD-10-CM | POA: Diagnosis not present

## 2022-02-14 DIAGNOSIS — N281 Cyst of kidney, acquired: Secondary | ICD-10-CM | POA: Diagnosis not present

## 2022-02-14 MED ORDER — LIDOCAINE 5 % EX PTCH: 1.0000 | MEDICATED_PATCH | CUTANEOUS | 0 refills | Status: DC

## 2022-02-14 MED ORDER — ACETAMINOPHEN 325 MG PO TABS
650.0000 mg | ORAL_TABLET | Freq: Once | ORAL | Status: AC
  Administered 2022-02-14: 650 mg via ORAL
  Filled 2022-02-14: qty 2

## 2022-02-14 NOTE — ED Triage Notes (Signed)
Pt here with L upper back pain onset a week ago, worsening this morning when she bent over. Pain worse with movement.

## 2022-02-18 ENCOUNTER — Encounter: Payer: Self-pay | Admitting: Internal Medicine

## 2022-02-18 ENCOUNTER — Ambulatory Visit (INDEPENDENT_AMBULATORY_CARE_PROVIDER_SITE_OTHER): Payer: Medicare Other | Admitting: Internal Medicine

## 2022-02-18 DIAGNOSIS — M545 Low back pain, unspecified: Secondary | ICD-10-CM

## 2022-02-18 DIAGNOSIS — G8929 Other chronic pain: Secondary | ICD-10-CM

## 2022-02-18 DIAGNOSIS — I10 Essential (primary) hypertension: Secondary | ICD-10-CM | POA: Diagnosis not present

## 2022-02-18 NOTE — Progress Notes (Signed)
Subjective:  Patient ID: Jasmin [name], female    DOB: 1933-12-18  Age: 88 y.o. MRN: [id number]  CC: No chief complaint on file.   HPI Jasmin [name] presents for LBP - s/p ER visit  Outpatient Medications Prior to Visit  Medication Sig Dispense Refill   acetaminophen (TYLENOL) 500 MG tablet Take 1 tablet (500 mg total) by mouth every 6 (six) hours as needed. (Patient taking differently: Take 1,000 mg by mouth every 6 (six) hours as needed for moderate pain or headache.) 30 tablet 0   Cyanocobalamin (VITAMIN B-12 PO) Take 1,000 mcg by mouth daily.     famotidine (PEPCID) 40 MG tablet TAKE 1 TABLET BY MOUTH  DAILY 90 tablet 3   gabapentin (NEURONTIN) 100 MG capsule Take 1-2 capsules (100-200 mg total) by mouth at bedtime. 60 capsule 3   hydrocortisone (ANUSOL-HC) 2.5 % rectal cream Place 1 application rectally at bedtime as needed for hemorrhoids or anal itching. 28 g 1   lidocaine (LIDODERM) 5 % Place 1 patch onto the skin daily. Remove & Discard patch within 12 hours or as directed by MD 5 patch 0   lovastatin (MEVACOR) 40 MG tablet TAKE 1 TABLET BY MOUTH AT  BEDTIME 90 tablet 0   metoprolol tartrate (LOPRESSOR) 50 MG tablet Take 1 tablet (50 mg total) by mouth 2 (two) times daily. Annual appt due w/labs must see provider for future refills 180 tablet 0   polyethylene glycol powder (GLYCOLAX/MIRALAX) powder Take 17 g by mouth 2 (two) times daily as needed for mild constipation, moderate constipation or severe constipation. (Patient taking differently: Take 17 g by mouth as needed for mild constipation, moderate constipation or severe constipation.) 500 g 3   spironolactone (ALDACTONE) 50 MG tablet TAKE 1 TABLET BY MOUTH  DAILY 90 tablet 3   No facility-administered medications prior to visit.    ROS: Review of Systems  Constitutional:  Positive for fatigue.  Musculoskeletal:  Positive for back pain and gait problem.    Objective:  BP 104/70 (BP Location: Left Arm,  Patient Position: Sitting, Cuff Size: Normal)   Pulse 60   Temp (!) 93 F (33.9 C) (Oral)   Ht '5\' 6"'$  (1.676 m)   Wt 221 lb (100.2 kg)   SpO2 93%   BMI 35.67 kg/m   BP Readings from Last 3 Encounters:  02/18/22 104/70  02/14/22 (!) 153/77  01/11/22 (!) 146/80    Wt Readings from Last 3 Encounters:  02/18/22 221 lb (100.2 kg)  01/11/22 221 lb (100.2 kg)  07/29/21 222 lb (100.7 kg)    Physical Exam Constitutional:      General: She is not in acute distress.    Appearance: She is well-developed. She is obese.  HENT:     Head: Normocephalic.     Right Ear: External ear normal.     Left Ear: External ear normal.     Nose: Nose normal.  Eyes:     General:        Right eye: No discharge.        Left eye: No discharge.     Conjunctiva/sclera: Conjunctivae normal.     Pupils: Pupils are equal, round, and reactive to light.  Neck:     Thyroid: No thyromegaly.     Vascular: No JVD.     Trachea: No tracheal deviation.  Cardiovascular:     Rate and Rhythm: Normal rate and regular rhythm.     Heart sounds: Normal heart sounds.  Pulmonary:     Effort: No respiratory distress.     Breath sounds: No stridor. No wheezing.  Abdominal:     General: Bowel sounds are normal. There is no distension.     Palpations: Abdomen is soft. There is no mass.     Tenderness: There is no abdominal tenderness. There is no guarding or rebound.  Musculoskeletal:        General: Tenderness present.     Cervical back: Normal range of motion and neck supple. No rigidity.  Lymphadenopathy:     Cervical: No cervical adenopathy.  Skin:    Findings: No erythema or rash.  Neurological:     Cranial Nerves: No cranial nerve deficit.     Motor: No abnormal muscle tone.     Coordination: Coordination normal.     Deep Tendon Reflexes: Reflexes normal.  Psychiatric:        Behavior: Behavior normal.        Thought Content: Thought content normal.        Judgment: Judgment normal.   LS w/pain  Lab  Results  Component Value Date   WBC 3.0 (L) 07/17/2021   HGB 12.9 07/17/2021   HCT 39.8 07/17/2021   PLT 76 (L) 07/17/2021   GLUCOSE 105 (H) 07/17/2021   CHOL 169 07/29/2020   TRIG 84.0 07/29/2020   HDL 61.70 07/29/2020   LDLDIRECT 139.3 05/31/2012   LDLCALC 91 07/29/2020   ALT 11 01/27/2021   AST 15 01/27/2021   NA 138 07/17/2021   K 4.1 07/17/2021   CL 108 07/17/2021   CREATININE 1.35 (H) 07/17/2021   BUN 20 07/17/2021   CO2 23 07/17/2021   TSH 1.56 07/29/2020   INR 1.1 07/17/2021   HGBA1C 6.0 01/27/2021    CT Renal Stone Study  Result Date: 02/14/2022 CLINICAL DATA:  Right flank pain.  Nephrolithiasis. EXAM: CT ABDOMEN AND PELVIS WITHOUT CONTRAST TECHNIQUE: Multidetector CT imaging of the abdomen and pelvis was performed following the standard protocol without IV contrast. RADIATION DOSE REDUCTION: This exam was performed according to the departmental dose-optimization program which includes automated exposure control, adjustment of the mA and/or kV according to patient size and/or use of iterative reconstruction technique. COMPARISON:  11/09/2020 FINDINGS: Lower chest: No acute findings. Hepatobiliary: Multiple hepatic cysts remain stable since prior exam. Gallbladder is unremarkable. No evidence of biliary ductal dilatation. Pancreas: No mass or inflammatory process visualized on this unenhanced exam. Spleen:  Within normal limits in size. Adrenals/Urinary tract: Stable 1.5 cm low-attenuation left adrenal mass, consistent with benign adenoma. 2 mm calculus noted in upper pole of right kidney. Bilateral renal sinus cysts are again seen, however there is no evidence of ureteral calculi or hydroureteronephrosis. Unremarkable unopacified urinary bladder. Stomach/Bowel: Stable small hiatal hernia. No evidence of obstruction, inflammatory process, or abnormal fluid collections. Normal appendix visualized. Colonic diverticulosis noted, without evidence of diverticulitis. Vascular/Lymphatic:  No pathologically enlarged lymph nodes identified. No evidence of abdominal aortic aneurysm. Aortic atherosclerotic calcification incidentally noted. Reproductive:  No mass or other significant abnormality. Other:  None. Musculoskeletal:  No suspicious bone lesions identified. IMPRESSION: Tiny right renal calculus and bilateral renal sinus cysts. No evidence of ureteral calculi, hydroureteronephrosis, or other acute findings. Colonic diverticulosis, without radiographic evidence of diverticulitis. Stable small hiatal hernia. Stable small benign left adrenal adenoma. Electronically Signed   By: Marlaine Hind M.D.   On: 02/14/2022 11:01    Assessment & Plan:   Problem List Items Addressed This Visit     Essential  hypertension    Doing well      Low back pain    MSK LBP Blue-Emu cream was recommended to use 2-3 times a day          No orders of the defined types were placed in this encounter.     Follow-up: Return for a follow-up visit.  Walker Kehr, MD

## 2022-02-18 NOTE — Assessment & Plan Note (Addendum)
MSK LBP Blue-Emu cream was recommended to use 2-3 times a day

## 2022-02-18 NOTE — Assessment & Plan Note (Signed)
Doing well 

## 2022-02-18 NOTE — Patient Instructions (Signed)
Blue-Emu cream -- use 2-3 times a day ? ?

## 2022-02-22 ENCOUNTER — Ambulatory Visit: Payer: Medicare Other | Admitting: Internal Medicine

## 2022-03-17 ENCOUNTER — Other Ambulatory Visit: Payer: Self-pay | Admitting: Internal Medicine

## 2022-03-18 ENCOUNTER — Other Ambulatory Visit: Payer: Self-pay | Admitting: Internal Medicine

## 2022-04-01 DIAGNOSIS — R040 Epistaxis: Secondary | ICD-10-CM | POA: Diagnosis not present

## 2022-04-09 ENCOUNTER — Other Ambulatory Visit: Payer: Self-pay

## 2022-04-09 ENCOUNTER — Emergency Department (HOSPITAL_BASED_OUTPATIENT_CLINIC_OR_DEPARTMENT_OTHER)
Admission: EM | Admit: 2022-04-09 | Discharge: 2022-04-09 | Disposition: A | Discharge status: Home / Self Care | Payer: Medicare Other | Attending: Emergency Medicine | Admitting: Emergency Medicine

## 2022-04-09 ENCOUNTER — Encounter (HOSPITAL_BASED_OUTPATIENT_CLINIC_OR_DEPARTMENT_OTHER): Payer: Self-pay | Admitting: Pharmacy Technician

## 2022-04-09 DIAGNOSIS — Z79899 Other long term (current) drug therapy: Secondary | ICD-10-CM | POA: Insufficient documentation

## 2022-04-09 DIAGNOSIS — I10 Essential (primary) hypertension: Secondary | ICD-10-CM | POA: Diagnosis not present

## 2022-04-09 DIAGNOSIS — R040 Epistaxis: Secondary | ICD-10-CM | POA: Diagnosis not present

## 2022-04-09 NOTE — ED Provider Notes (Signed)
[facility]ERG[facility] DEPT Provider Note   CSN: [id number] Arrival date & time: 04/09/22  1524     History  Chief Complaint  Patient presents with   Epistaxis    Jasmin [name] is a 88 y.o. female.  The history is provided by the patient, the spouse and medical records. No language interpreter was used.  Epistaxis Location:  L nare Severity:  Moderate Duration:  30 minutes (30 min x 2 today) Timing:  Sporadic Progression:  Resolved Chronicity:  Recurrent Relieved by:  Applying pressure (and afrin) Worsened by:  Nothing Ineffective treatments:  None tried Associated symptoms: no blood in oropharynx, no congestion, no cough, no dizziness, no fever, no headaches, no sneezing, no sore throat and no syncope        Home Medications Prior to Admission medications   Medication Sig Start Date End Date Taking? Authorizing Provider  acetaminophen (TYLENOL) 500 MG tablet Take 1 tablet (500 mg total) by mouth every 6 (six) hours as needed. Patient taking differently: Take 1,000 mg by mouth every 6 (six) hours as needed for moderate pain or headache. 07/25/20   Zigmund Gottron, NP  Cyanocobalamin (VITAMIN B-12 PO) Take 1,000 mcg by mouth daily.    [provider]  famotidine (PEPCID) 40 MG tablet TAKE 1 TABLET BY MOUTH  DAILY 11/12/21   Plotnikov, Evie Lacks, MD  gabapentin (NEURONTIN) 100 MG capsule Take 1-2 capsules (100-200 mg total) by mouth at bedtime. 07/27/21   Plotnikov, Evie Lacks, MD  hydrocortisone (ANUSOL-HC) 2.5 % rectal cream Place 1 application rectally at bedtime as needed for hemorrhoids or anal itching. 02/18/21   Irene Shipper, MD  lidocaine (LIDODERM) 5 % Place 1 patch onto the skin daily. Remove & Discard patch within 12 hours or as directed by MD 02/14/22   Sherwood Gambler, MD  lovastatin (MEVACOR) 40 MG tablet TAKE 1 TABLET BY MOUTH AT  BEDTIME 03/18/22   Plotnikov, Evie Lacks, MD  metoprolol tartrate (LOPRESSOR) 50 MG tablet TAKE 1 TABLET BY  MOUTH TWICE  DAILY 03/18/22   Plotnikov, Evie Lacks, MD  polyethylene glycol powder (GLYCOLAX/MIRALAX) powder Take 17 g by mouth 2 (two) times daily as needed for mild constipation, moderate constipation or severe constipation. Patient taking differently: Take 17 g by mouth as needed for mild constipation, moderate constipation or severe constipation. 07/06/18   Plotnikov, Evie Lacks, MD  spironolactone (ALDACTONE) 50 MG tablet TAKE 1 TABLET BY MOUTH  DAILY 11/12/21   Plotnikov, Evie Lacks, MD      Allergies    Allopurinol, Doxycycline, Hctz [hydrochlorothiazide], Omeprazole, Oxybutynin, Sulfonamide derivatives, Clarithromycin, and Penicillins    Review of Systems   Review of Systems  Constitutional:  Negative for chills, fatigue and fever.  HENT:  Positive for nosebleeds. Negative for congestion, sneezing and sore throat.   Respiratory:  Negative for cough, choking, chest tightness, shortness of breath and wheezing.   Cardiovascular:  Negative for chest pain, palpitations and syncope.  Gastrointestinal:  Negative for abdominal pain, constipation, diarrhea, nausea and vomiting.  Genitourinary:  Negative for dysuria.  Musculoskeletal:  Negative for back pain, neck pain and neck stiffness.  Skin:  Negative for rash and wound.  Neurological:  Negative for dizziness, light-headedness, numbness and headaches.  Psychiatric/Behavioral:  Negative for agitation and confusion.   All other systems reviewed and are negative.   Physical Exam Updated Vital Signs BP 129/76   Pulse 63   Temp (!) 97.5 F (36.4 C) (Temporal)   Resp 16  SpO2 98%  Physical Exam  ED Results / Procedures / Treatments   Labs (all labs ordered are listed, but only abnormal results are displayed) Labs Reviewed - No data to display  EKG None  Radiology No results found.  Procedures Procedures    Medications Ordered in ED Medications - No data to display  ED Course/ Medical Decision Making/ A&P                            Medical Decision Making   Jasmin [name] is a 88 y.o. female with a past medical history significant for hypertension, hyperlipidemia, diverticulosis, GERD, pulses to kidneys, and gout who presents with epistaxis.  According to patient, she saw ENT last week for a follow-up visit from when she had a nosebleed back in December and they told her that there was an area in her left nare that could bleed.  I told her to return if any bleeding started or symptoms return.  Patient reports she called but they were out of town and told her to go to the nearest emergency department.  She reports that today she has had 230-minute episodes of nosebleed on the left nare but after using Afrin and pressure they stopped.  She reports otherwise no headache, lightheadedness, palpitations, chest pain, shortness of breath, near syncope, or other complaints.  She reports no head injury.  She does report her lips have been cracked and dry with the weather changes but otherwise no other complaints.  She does not take blood thinners no other symptoms of symptomatic anemia.  On my exam, patient has some small amount of dried blood at the left nare but is not obstructing and I can see farther back.  No active bleeding seen.  No blood in the posterior oropharynx.  Some mild soreness on her left nose but otherwise no tenderness on the sinuses.  Pupils symmetric and reactive normal extraocular movements.  Exam otherwise unremarkable with clear breath sounds.  As the patient has no further epistaxis, we agree with discharge home.  We do recommend she call her ENT to get seen sooner as that was the return precaution.  As there is no bleeding at this time.  There is no epistaxis needing intervention, packing, or balloon use.  Patient and family agree with plan of care.  They will call her ENT tomorrow to discuss follow-up this week if possible.  She will treat nosebleeds as she has before.  Blood pressure was not  critically elevated or hypotensive and given her lack of other symptoms we agreed to hold on labs.  She no other questions or concerns and was discharged in good condition with no acute epistaxis at this time.           Final Clinical Impression(s) / ED Diagnoses Final diagnoses:  Epistaxis    Rx / DC Orders ED Discharge Orders          Ordered    Ambulatory referral to ENT        04/09/22 1830           Clinical Impression: 1. Epistaxis     Disposition: Discharge  Condition: Good  I have discussed the results, Dx and Tx plan with the pt(& family if present). He/she/they expressed understanding and agree(s) with the plan. Discharge instructions discussed at great length. Strict return precautions discussed and pt &/or family have verbalized understanding of the instructions. No further questions at time  of discharge.    New Prescriptions   No medications on file    Follow Up: call back your ENT for follow up     [facility] [id number] [place], Evie Lacks, MD [place] [address] [phone]         Lanesha Azzaro, Gwenyth Allegra, MD 04/09/22 [phone]

## 2022-04-09 NOTE — Discharge Instructions (Signed)
Your history and exam today are consistent with recurrent nosebleeds.  As you just saw your ENT team last week and now had 2 nosebleeds today, we do feel you need to follow-up with them.  On my assessment you had no bleeding here thus there was no bleeding to intervene on.  There was no active bleeding in the back of your throat and your exam was otherwise reassuring.  Please rest and stay hydrated and if you have recurrent please use the Afrin and pressure as you have had success with today.  Please call your ENT to get seen the soon as possible for reassessment.  If any symptoms change or worsen acutely, please return to the nearest emergency department.

## 2022-04-09 NOTE — ED Triage Notes (Signed)
Pt reports intermittent nose bleeds onset today. Not currently bleeding. Pt not on anticoagulants.

## 2022-04-11 ENCOUNTER — Emergency Department (HOSPITAL_COMMUNITY)
Admission: EM | Admit: 2022-04-11 | Discharge: 2022-04-12 | Discharge status: Against Medical Advice | Payer: Medicare Other | Source: Home / Self Care

## 2022-04-11 ENCOUNTER — Other Ambulatory Visit: Payer: Self-pay

## 2022-04-11 ENCOUNTER — Emergency Department (HOSPITAL_BASED_OUTPATIENT_CLINIC_OR_DEPARTMENT_OTHER)
Admission: EM | Admit: 2022-04-11 | Discharge: 2022-04-11 | Disposition: A | Discharge status: Home / Self Care | Payer: Medicare Other | Attending: Emergency Medicine | Admitting: Emergency Medicine

## 2022-04-11 ENCOUNTER — Emergency Department (HOSPITAL_BASED_OUTPATIENT_CLINIC_OR_DEPARTMENT_OTHER): Payer: Medicare Other | Admitting: Radiology

## 2022-04-11 ENCOUNTER — Encounter (HOSPITAL_BASED_OUTPATIENT_CLINIC_OR_DEPARTMENT_OTHER): Payer: Self-pay | Admitting: Emergency Medicine

## 2022-04-11 DIAGNOSIS — M79672 Pain in left foot: Secondary | ICD-10-CM | POA: Insufficient documentation

## 2022-04-11 DIAGNOSIS — M25572 Pain in left ankle and joints of left foot: Secondary | ICD-10-CM | POA: Diagnosis not present

## 2022-04-11 DIAGNOSIS — Z79899 Other long term (current) drug therapy: Secondary | ICD-10-CM | POA: Insufficient documentation

## 2022-04-11 DIAGNOSIS — Z5321 Procedure and treatment not carried out due to patient leaving prior to being seen by health care provider: Secondary | ICD-10-CM | POA: Insufficient documentation

## 2022-04-11 DIAGNOSIS — M25472 Effusion, left ankle: Secondary | ICD-10-CM | POA: Insufficient documentation

## 2022-04-11 DIAGNOSIS — M7989 Other specified soft tissue disorders: Secondary | ICD-10-CM | POA: Diagnosis not present

## 2022-04-11 DIAGNOSIS — I10 Essential (primary) hypertension: Secondary | ICD-10-CM | POA: Insufficient documentation

## 2022-04-11 MED ORDER — GABAPENTIN 600 MG PO TABS
300.0000 mg | ORAL_TABLET | Freq: Once | ORAL | Status: DC
  Filled 2022-04-11: qty 0.5

## 2022-04-11 MED ORDER — GABAPENTIN 300 MG PO CAPS
300.0000 mg | ORAL_CAPSULE | Freq: Once | ORAL | Status: AC
  Administered 2022-04-11: 300 mg via ORAL
  Filled 2022-04-11: qty 1

## 2022-04-11 MED ORDER — ACETAMINOPHEN 500 MG PO TABS
1000.0000 mg | ORAL_TABLET | Freq: Once | ORAL | Status: AC
  Administered 2022-04-11: 1000 mg via ORAL
  Filled 2022-04-11: qty 2

## 2022-04-11 MED ORDER — KETOROLAC TROMETHAMINE 60 MG/2ML IM SOLN
30.0000 mg | Freq: Once | INTRAMUSCULAR | Status: AC
  Administered 2022-04-11: 30 mg via INTRAMUSCULAR
  Filled 2022-04-11: qty 2

## 2022-04-11 NOTE — ED Triage Notes (Signed)
Pt here for L medial foot pain that started yesterday. Pt tearful in triage states pain comes in waves, believes she has gout.

## 2022-04-11 NOTE — ED Provider Notes (Signed)
[facility] EMERGENCY DEPT Provider Note   CSN: [id number] Arrival date & time: 04/11/22  1009     History \ Chief Complaint  Patient presents with   Foot Pain    Jasmin [name] is a 88 y.o. female. With pmh hypertension, hyperlipidemia, diverticulosis, GERD, gout presenting with atraumatic left ankle and foot pain.  Patient notes a remote history of old ankle fracture.  She also notes recently pulling something on her right side of her body which is led to overcompensation and bearing more weight on her left ankle.  She had noted some swelling of her left ankle and some pain in her foot that felt burning and deep and went from her arch of her foot into her ankle.  She was trying to use lidocaine patches and Voltaren gel without relief.  She denies any recent falls or injuries.  Denies any skin changes, no erythema, warmth, abrasions or lacerations.  She denies any pain coming from her back into the leg or abdomen.  Denies any numbness or tingling.  Denies any history of diabetes.   Foot Pain       Home Medications Prior to Admission medications   Medication Sig Start Date End Date Taking? Authorizing Provider  acetaminophen (TYLENOL) 500 MG tablet Take 1 tablet (500 mg total) by mouth every 6 (six) hours as needed. Patient taking differently: Take 1,000 mg by mouth every 6 (six) hours as needed for moderate pain or headache. 07/25/20   Zigmund Gottron, NP  Cyanocobalamin (VITAMIN B-12 PO) Take 1,000 mcg by mouth daily.    [provider]  famotidine (PEPCID) 40 MG tablet TAKE 1 TABLET BY MOUTH  DAILY 11/12/21   Plotnikov, Evie Lacks, MD  gabapentin (NEURONTIN) 100 MG capsule Take 1-2 capsules (100-200 mg total) by mouth at bedtime. 07/27/21   Plotnikov, Evie Lacks, MD  hydrocortisone (ANUSOL-HC) 2.5 % rectal cream Place 1 application rectally at bedtime as needed for hemorrhoids or anal itching. 02/18/21   Irene Shipper, MD  lidocaine (LIDODERM) 5 % Place 1  patch onto the skin daily. Remove & Discard patch within 12 hours or as directed by MD 02/14/22   Sherwood Gambler, MD  lovastatin (MEVACOR) 40 MG tablet TAKE 1 TABLET BY MOUTH AT  BEDTIME 03/18/22   Plotnikov, Evie Lacks, MD  metoprolol tartrate (LOPRESSOR) 50 MG tablet TAKE 1 TABLET BY MOUTH TWICE  DAILY 03/18/22   Plotnikov, Evie Lacks, MD  polyethylene glycol powder (GLYCOLAX/MIRALAX) powder Take 17 g by mouth 2 (two) times daily as needed for mild constipation, moderate constipation or severe constipation. Patient taking differently: Take 17 g by mouth as needed for mild constipation, moderate constipation or severe constipation. 07/06/18   Plotnikov, Evie Lacks, MD  spironolactone (ALDACTONE) 50 MG tablet TAKE 1 TABLET BY MOUTH  DAILY 11/12/21   Plotnikov, Evie Lacks, MD      Allergies    Allopurinol, Doxycycline, Hctz [hydrochlorothiazide], Omeprazole, Oxybutynin, Sulfonamide derivatives, Clarithromycin, and Penicillins    Review of Systems   Review of Systems  Physical Exam Updated Vital Signs BP (!) 114/41 (BP Location: Left Arm)   Pulse (!) 52   Temp 98.3 F (36.8 C) (Oral)   Resp 16   SpO2 98%  Physical Exam Constitutional: Alert and oriented. Well appearing and in no distress. Eyes: Conjunctivae are normal. ENT      Head: Normocephalic and atraumatic.      Nose: No congestion.      Mouth/Throat: Mucous membranes are moist.  Neck: No stridor. Cardiovascular: S1, S2, bilateral DP pulses equal and strong.Warm and well perfused. Respiratory: Normal respiratory effort.  Gastrointestinal: Soft and nontender.  Musculoskeletal: Normal range of motion in all extremities.  Full 5 out of 5 bilateral dorsiflexion plantarflexion and knee flexion and extension.  Ambulatory with steady gait.      Right lower leg: No tenderness or edema.      Left lower leg: Mild swelling and tenderness around the left ankle but no point tenderness of the ankle, foot or tibia/fibula, no erythema, warmth or  skin changes. Sensation intact, Neurologic: Normal speech and language. No gross focal neurologic deficits are appreciated. Skin: Skin is warm, dry and intact. No rash noted. Psychiatric: Mood and affect are normal. Speech and behavior are normal.  ED Results / Procedures / Treatments   Labs (all labs ordered are listed, but only abnormal results are displayed) Labs Reviewed - No data to display  EKG None  Radiology DG Ankle Complete Left  Result Date: 04/11/2022 CLINICAL DATA:  Burning pain along medial left ankle/foot with no known injury. EXAM: LEFT ANKLE COMPLETE - 3+ VIEW COMPARISON:  None Available. FINDINGS: There are osseous fragments along the medial malleolus consistent with age-indeterminate fracture fragments. There is no other evidence of fracture. Ankle alignment is normal. There is bimalleolar soft tissue swelling, worse over the medial malleolus. IMPRESSION: 1. Osseous fragments along the medial malleolus consistent with age-indeterminate fractures. Correlate with point tenderness. 2. Soft tissue swelling worst over the medial malleolus. Electronically Signed   By: Valetta Mole M.D.   On: 04/11/2022 12:28   DG Foot Complete Left  Result Date: 04/11/2022 CLINICAL DATA:  Pain and swelling. Pain localizes to the medial left ankle and foot. No known injury. EXAM: LEFT FOOT - COMPLETE 3+ VIEW COMPARISON:  None Available. FINDINGS: Hallux valgus deformity. Lateral deviation of the second through fourth rays noted. Mild degenerative changes identified at the first MTP joint. Acute fracture or dislocation. No focal bone erosions identified. Soft tissues are unremarkable. IMPRESSION: 1. No acute bone abnormality. 2. Hallux valgus deformity. 3. Lateral deviation of the second through fourth rays. Electronically Signed   By: Kerby Moors M.D.   On: 04/11/2022 12:20    Procedures Procedures    Medications Ordered in ED Medications  acetaminophen (TYLENOL) tablet 1,000 mg (1,000 mg  Oral Given 04/11/22 1228)  ketorolac (TORADOL) injection 30 mg (30 mg Intramuscular Given 04/11/22 1229)  gabapentin (NEURONTIN) capsule 300 mg (300 mg Oral Given 04/11/22 1230)    ED Course/ Medical Decision Making/ A&P Clinical Course as of 04/11/22 1521  Sun Apr 11, 2022  1256 Patient's plain film of left foot also independently reviewed with no acute fracture, agree with radiology read.  Ankle read as bilateral ankle swelling consistent with exam with age indeterminate fractures.  She has no point tenderness and no recent injuries, unlikely to be acute fracture.  She does note having history of old fractures.  Suspect her pain today is likely due to underlying ligamentous swelling, possible sprain or strain versus neuropathic pain.  She is not diabetic.  She felt better with medications today.  She feels comfortable without a walking boot or postop shoe.  Advised continued supportive care and strict return precautions.  She is in agreement with plan. [VB]    Clinical Course User Index [VB] Elgie Congo, MD  Medical Decision Making Patient with atraumatic left ankle swelling and pain.  Her left lower extremity is neurovascularly intact, no concern for ischemic limb or vascular abnormality.  She denies any recent acute injuries and x-ray obtained showed no evidence of traumatic acute fracture.  She has no erythema warmth or skin changes concerning for cellulitis.  She does not have diffuse swelling throughout the leg consistent with DVT.  Patient's plain film of left foot also independently reviewed with no acute fracture, agree with radiology read.  Ankle read as bilateral ankle swelling consistent with exam with age indeterminate fractures.  She has no point tenderness and no recent injuries, unlikely to be acute fracture.  She does note having history of old fractures.  Suspect her pain today is likely due to underlying ligamentous swelling, possible sprain or  strain versus neuropathic pain.  She is not diabetic.  She felt better with medications today (toradol, Tylenol, gabapentin).  She feels comfortable without a walking boot or postop shoe.  Advised continued supportive care and strict return precautions.  She is in agreement with plan.   Amount and/or Complexity of Data Reviewed Radiology: ordered.  Risk OTC drugs. Prescription drug management.    Final Clinical Impression(s) / ED Diagnoses Final diagnoses:  Left foot pain  Acute left ankle pain  Left ankle swelling    Rx / DC Orders ED Discharge Orders     None         Elgie Congo, MD 04/11/22 1521

## 2022-04-11 NOTE — ED Provider Triage Note (Signed)
Emergency Medicine Provider Triage Evaluation Note  Jasmin [name] , a 88 y.o. female  was evaluated in triage.  Pt complains of left foot pain that began last night.  Evaluated at [facility] emergency room earlier today.  She was given multimodal pain control with resolution of her pain.  She states she went home and following supper pain came back.  Appears tearful during the interview.  Denies injury.  Review of Systems  Positive: As above Negative: As above  Physical Exam  There were no vitals taken for this visit. Gen:   Awake, no distress   Resp:  Normal effort  MSK:   Moves extremities without difficulty  Other:   Medical Decision Making  Medically screening exam initiated at 7:11 PM.  Appropriate orders placed.  Jasmin [name] was informed that the remainder of the evaluation will be completed by another provider, this initial triage assessment does not replace that evaluation, and the importance of remaining in the ED until their evaluation is complete.     Jasmin Courier, PA-C 04/11/22 1912

## 2022-04-11 NOTE — ED Notes (Signed)
Patient requesting pain medicine for foot. Triage rn notified

## 2022-04-11 NOTE — Discharge Instructions (Signed)
You were seen in the Emergency Department for left foot and ankle pain. Your x-ray did not show any signs of an acute fracture or dislocation.  However, it did show signs of old fractures in the ankle.  We suspect you could have some mild swelling and underlying sprain or strain from putting more weight on that ankle.  Take ibuprofen, 400 mg every 6 hours as directed on the packaging, to reduce pain and inflammation at home.  Take Tylenol 650 mg every 6 hours for pain.  You can also take your gabapentin as needed.  To take care of your left foot and ankle while you heal,  R- rest injury as needed, do not overexert yourself I - ice the affected area, 20 minutes every 4-6 hours, this will help with any swelling C- compress the affected area as needed to apply support and to decrease any swelling E- elevate frequently when in sitting or laying position.    Return to the Emergency Department if you develop worsening pain, swelling, redness, warmth, numbness, loss of sensation, fever, chills, vomiting, or any other symptoms you find concerning.

## 2022-04-11 NOTE — ED Triage Notes (Signed)
Pt reports bilateral foot pain to distal dorsal feet.  Pain somewhat relieved by lidocaine OTC patch.  Pt h/o arthritis, denies foot pain "like this" before.

## 2022-04-12 ENCOUNTER — Emergency Department (HOSPITAL_BASED_OUTPATIENT_CLINIC_OR_DEPARTMENT_OTHER)
Admission: EM | Admit: 2022-04-12 | Discharge: 2022-04-12 | Disposition: A | Discharge status: Home / Self Care | Payer: Medicare Other | Attending: Emergency Medicine | Admitting: Emergency Medicine

## 2022-04-12 ENCOUNTER — Telehealth: Payer: Self-pay

## 2022-04-12 ENCOUNTER — Encounter (HOSPITAL_BASED_OUTPATIENT_CLINIC_OR_DEPARTMENT_OTHER): Payer: Self-pay

## 2022-04-12 DIAGNOSIS — Z79899 Other long term (current) drug therapy: Secondary | ICD-10-CM | POA: Insufficient documentation

## 2022-04-12 DIAGNOSIS — M79672 Pain in left foot: Secondary | ICD-10-CM | POA: Insufficient documentation

## 2022-04-12 DIAGNOSIS — I10 Essential (primary) hypertension: Secondary | ICD-10-CM | POA: Diagnosis not present

## 2022-04-12 MED ORDER — OXYCODONE-ACETAMINOPHEN 5-325 MG PO TABS
1.0000 | ORAL_TABLET | Freq: Once | ORAL | Status: AC
  Administered 2022-04-12: 1 via ORAL
  Filled 2022-04-12: qty 1

## 2022-04-12 MED ORDER — METHYLPREDNISOLONE 4 MG PO TBPK: ORAL_TABLET | ORAL | 21 refills | Status: DC

## 2022-04-12 NOTE — ED Triage Notes (Signed)
Pt presents for L medial foot pain since yesterday. Seen yesterday for same. Pt states that she has had gout before and thinks this may be a recurrence. Pt tearful in triage.

## 2022-04-12 NOTE — ED Provider Notes (Signed)
[facility] EMERGENCY DEPT Provider Note   CSN: [id number] Arrival date & time: 04/12/22  0126     History  Chief Complaint  Patient presents with   Foot Pain    Jasmin [name] is a 88 y.o. female.  HPI     This is an 88 year old female who presents with ongoing foot pain.  She was seen and evaluated yesterday for the same around lunchtime.  She had x-rays which were unremarkable.  She was given Toradol, gabapentin, and Tylenol.  She was instructed to alternate Tylenol and ibuprofen at home.  Patient reports that she took 1 dose each at home yesterday afternoon and "I cannot tell if it made a difference."  She presented to [facility] around 5 PM and waited in the lobby.  She since that time has not taken any additional medication.  She states that the pain is mostly in the medial aspect of her left foot and extends into her MTP.  She does have a history of gout but cannot tell me whether this is similar.  She feels like her toe is more red than normal.  Pain is worse with ambulation.  Denies fevers.  Home Medications Prior to Admission medications   Medication Sig Start Date End Date Taking? Authorizing Provider  methylPREDNISolone (MEDROL DOSEPAK) 4 MG TBPK tablet Take as directed on package 04/12/22  Yes Toris Laverdiere, Barbette Hair, MD  acetaminophen (TYLENOL) 500 MG tablet Take 1 tablet (500 mg total) by mouth every 6 (six) hours as needed. Patient taking differently: Take 1,000 mg by mouth every 6 (six) hours as needed for moderate pain or headache. 07/25/20   Zigmund Gottron, NP  Cyanocobalamin (VITAMIN B-12 PO) Take 1,000 mcg by mouth daily.    [provider]  famotidine (PEPCID) 40 MG tablet TAKE 1 TABLET BY MOUTH  DAILY 11/12/21   Plotnikov, Evie Lacks, MD  gabapentin (NEURONTIN) 100 MG capsule Take 1-2 capsules (100-200 mg total) by mouth at bedtime. 07/27/21   Plotnikov, Evie Lacks, MD  hydrocortisone (ANUSOL-HC) 2.5 % rectal cream Place 1 application rectally  at bedtime as needed for hemorrhoids or anal itching. 02/18/21   Irene Shipper, MD  lidocaine (LIDODERM) 5 % Place 1 patch onto the skin daily. Remove & Discard patch within 12 hours or as directed by MD 02/14/22   Sherwood Gambler, MD  lovastatin (MEVACOR) 40 MG tablet TAKE 1 TABLET BY MOUTH AT  BEDTIME 03/18/22   Plotnikov, Evie Lacks, MD  metoprolol tartrate (LOPRESSOR) 50 MG tablet TAKE 1 TABLET BY MOUTH TWICE  DAILY 03/18/22   Plotnikov, Evie Lacks, MD  polyethylene glycol powder (GLYCOLAX/MIRALAX) powder Take 17 g by mouth 2 (two) times daily as needed for mild constipation, moderate constipation or severe constipation. Patient taking differently: Take 17 g by mouth as needed for mild constipation, moderate constipation or severe constipation. 07/06/18   Plotnikov, Evie Lacks, MD  spironolactone (ALDACTONE) 50 MG tablet TAKE 1 TABLET BY MOUTH  DAILY 11/12/21   Plotnikov, Evie Lacks, MD      Allergies    Allopurinol, Doxycycline, Hctz [hydrochlorothiazide], Omeprazole, Oxybutynin, Sulfonamide derivatives, Clarithromycin, and Penicillins    Review of Systems   Review of Systems  Musculoskeletal:        Left foot pain  All other systems reviewed and are negative.   Physical Exam Updated Vital Signs BP 128/79   Pulse 68   Temp 98.3 F (36.8 C) (Oral)   Resp 18   Ht 1.676 m (5'  6")   Wt 100.2 kg   SpO2 99%   BMI 35.67 kg/m  Physical Exam Vitals and nursing note reviewed.  Constitutional:      Appearance: She is well-developed.     Comments: Overweight  HENT:     Head: Normocephalic and atraumatic.  Eyes:     Pupils: Pupils are equal, round, and reactive to light.  Cardiovascular:     Rate and Rhythm: Normal rate and regular rhythm.  Pulmonary:     Effort: Pulmonary effort is normal. No respiratory distress.  Abdominal:     Palpations: Abdomen is soft.  Musculoskeletal:     Cervical back: Neck supple.     Comments: Focused examination of the left foot with tenderness to  palpation on the plantar surface just under the MTP joint, slight erythema, no warmth, no significant swelling, 2+ DP pulse  Skin:    General: Skin is warm and dry.  Neurological:     Mental Status: She is alert and oriented to person, place, and time.  Psychiatric:        Mood and Affect: Mood normal.     ED Results / Procedures / Treatments   Labs (all labs ordered are listed, but only abnormal results are displayed) Labs Reviewed - No data to display  EKG None  Radiology DG Ankle Complete Left  Result Date: 04/11/2022 CLINICAL DATA:  Burning pain along medial left ankle/foot with no known injury. EXAM: LEFT ANKLE COMPLETE - 3+ VIEW COMPARISON:  None Available. FINDINGS: There are osseous fragments along the medial malleolus consistent with age-indeterminate fracture fragments. There is no other evidence of fracture. Ankle alignment is normal. There is bimalleolar soft tissue swelling, worse over the medial malleolus. IMPRESSION: 1. Osseous fragments along the medial malleolus consistent with age-indeterminate fractures. Correlate with point tenderness. 2. Soft tissue swelling worst over the medial malleolus. Electronically Signed   By: Valetta Mole M.D.   On: 04/11/2022 12:28   DG Foot Complete Left  Result Date: 04/11/2022 CLINICAL DATA:  Pain and swelling. Pain localizes to the medial left ankle and foot. No known injury. EXAM: LEFT FOOT - COMPLETE 3+ VIEW COMPARISON:  None Available. FINDINGS: Hallux valgus deformity. Lateral deviation of the second through fourth rays noted. Mild degenerative changes identified at the first MTP joint. Acute fracture or dislocation. No focal bone erosions identified. Soft tissues are unremarkable. IMPRESSION: 1. No acute bone abnormality. 2. Hallux valgus deformity. 3. Lateral deviation of the second through fourth rays. Electronically Signed   By: Kerby Moors M.D.   On: 04/11/2022 12:20    Procedures Procedures    Medications Ordered in  ED Medications  oxyCODONE-acetaminophen (PERCOCET/ROXICET) 5-325 MG per tablet 1 tablet (1 tablet Oral Given 04/12/22 0258)    ED Course/ Medical Decision Making/ A&P                           Medical Decision Making Risk Prescription drug management.   This patient presents to the ED for concern of left foot pain, this involves an extensive number of treatment options, and is a complaint that carries with it a high risk of complications and morbidity.  I considered the following differential and admission for this acute, potentially life threatening condition.  The differential diagnosis includes gout, musculoskeletal etiology such as strain, neuropathy, less likely injury given absence of history and recent negative x-rays  MDM:    This is an 88 year old female who presents with  persistent left foot pain.  She is nontoxic and vital signs are largely reassuring.  Focused examination left foot reveals tenderness mostly at the MTP and may be some slight erythema.  Not consistent with cellulitis or infection.  Low suspicion for septic arthritis.  Gout is certainly a consideration given the location.  I have reviewed her prior lab work from last year.  She does have a baseline creatinine in the 1.2-1.3 range.  She may be a better candidate for a steroid Dosepak versus NSAIDs.  She was given a dose of pain medication.  Do not feel she needs repeat imaging or lab work.  Will discharge with a Medrol Dosepak and Tylenol as needed.  (Labs, imaging, consults)  Labs: I Ordered, and personally interpreted labs.  The pertinent results include: None  Imaging Studies ordered: I ordered imaging studies including none I independently visualized and interpreted imaging. I agree with the radiologist interpretation  Additional history obtained from husband at bedside.  External records from outside source obtained and reviewed including recent ED evaluation  Cardiac Monitoring: The patient was maintained  on a cardiac monitor.  I personally viewed and interpreted the cardiac monitored which showed an underlying rhythm of: Normal sinus rhythm  Reevaluation: After the interventions noted above, I reevaluated the patient and found that they have :improved  Social Determinants of Health: Elderly, lives independently  Disposition: Discharge  Co morbidities that complicate the patient evaluation  Past Medical History:  Diagnosis Date   Contact dermatitis and other eczema, due to unspecified cause    Diverticulosis of colon    Esophageal stricture    GERD with stricture    Dr Henrene Pastor   Gout    Hemorrhoids    Hiatal hernia    Hyperlipidemia    Hypertension    Onychomycosis    Personal history of urinary calculi    Polycystic kidney    Tubulovillous adenoma polyp of colon    Unspecified vitamin D deficiency    Vitamin B12 deficiency 2009   Borderline     Medicines Meds ordered this encounter  Medications   oxyCODONE-acetaminophen (PERCOCET/ROXICET) 5-325 MG per tablet 1 tablet   methylPREDNISolone (MEDROL DOSEPAK) 4 MG TBPK tablet    Sig: Take as directed on package    Dispense:  21 tablet    Refill:  21    I have reviewed the patients home medicines and have made adjustments as needed  Problem List / ED Course: Problem List Items Addressed This Visit   None Visit Diagnoses     Left foot pain    -  Primary                   Final Clinical Impression(s) / ED Diagnoses Final diagnoses:  Left foot pain    Rx / DC Orders ED Discharge Orders          Ordered    methylPREDNISolone (MEDROL DOSEPAK) 4 MG TBPK tablet        04/12/22 0252              Merryl Hacker, MD 04/12/22 [phone]

## 2022-04-12 NOTE — Telephone Encounter (Signed)
        Patient  visited [facility] on 8/21  for neck pain   Telephone encounter attempt :  1st  A HIPAA compliant voice message was left requesting a return call.  Instructed patient to call back at   earliest convenience.[facility], [facility]  [phone] [address], [place], [place] [address] Phone: [phone] Email: [email or web address]

## 2022-04-12 NOTE — Discharge Instructions (Signed)
You were seen today for foot pain.  This may be related to your known gout.  Start the Medrol Dosepak.  You may also take Tylenol as needed for pain.  Follow-up with your primary physician if not improving.

## 2022-04-12 NOTE — ED Notes (Signed)
Pt and Husband stated that the wait was too long and she was going to go home and take some pain medications. Pt was advised to come back or go to their PCP if the symptoms get worse. Pt seen leaving with family.

## 2022-04-14 ENCOUNTER — Emergency Department (HOSPITAL_COMMUNITY)
Admission: EM | Admit: 2022-04-14 | Discharge: 2022-04-14 | Disposition: A | Discharge status: Home / Self Care | Payer: Medicare Other | Attending: Emergency Medicine | Admitting: Emergency Medicine

## 2022-04-14 ENCOUNTER — Encounter (HOSPITAL_COMMUNITY): Payer: Medicare Other

## 2022-04-14 ENCOUNTER — Other Ambulatory Visit: Payer: Self-pay

## 2022-04-14 ENCOUNTER — Encounter (HOSPITAL_COMMUNITY): Payer: Self-pay

## 2022-04-14 ENCOUNTER — Emergency Department (HOSPITAL_COMMUNITY): Payer: Medicare Other

## 2022-04-14 DIAGNOSIS — Z79899 Other long term (current) drug therapy: Secondary | ICD-10-CM | POA: Insufficient documentation

## 2022-04-14 DIAGNOSIS — I129 Hypertensive chronic kidney disease with stage 1 through stage 4 chronic kidney disease, or unspecified chronic kidney disease: Secondary | ICD-10-CM | POA: Insufficient documentation

## 2022-04-14 DIAGNOSIS — R4701 Aphasia: Secondary | ICD-10-CM

## 2022-04-14 DIAGNOSIS — Z20822 Contact with and (suspected) exposure to covid-19: Secondary | ICD-10-CM | POA: Diagnosis not present

## 2022-04-14 DIAGNOSIS — N390 Urinary tract infection, site not specified: Secondary | ICD-10-CM | POA: Diagnosis not present

## 2022-04-14 DIAGNOSIS — G478 Other sleep disorders: Secondary | ICD-10-CM | POA: Diagnosis not present

## 2022-04-14 DIAGNOSIS — N1832 Chronic kidney disease, stage 3b: Secondary | ICD-10-CM | POA: Insufficient documentation

## 2022-04-14 DIAGNOSIS — R531 Weakness: Secondary | ICD-10-CM | POA: Insufficient documentation

## 2022-04-14 DIAGNOSIS — D320 Benign neoplasm of cerebral meninges: Secondary | ICD-10-CM | POA: Diagnosis not present

## 2022-04-14 DIAGNOSIS — I6789 Other cerebrovascular disease: Secondary | ICD-10-CM | POA: Diagnosis not present

## 2022-04-14 DIAGNOSIS — Q613 Polycystic kidney, unspecified: Secondary | ICD-10-CM | POA: Diagnosis not present

## 2022-04-14 DIAGNOSIS — G459 Transient cerebral ischemic attack, unspecified: Secondary | ICD-10-CM | POA: Diagnosis not present

## 2022-04-14 DIAGNOSIS — Z743 Need for continuous supervision: Secondary | ICD-10-CM | POA: Diagnosis not present

## 2022-04-14 DIAGNOSIS — D696 Thrombocytopenia, unspecified: Secondary | ICD-10-CM | POA: Insufficient documentation

## 2022-04-14 DIAGNOSIS — N189 Chronic kidney disease, unspecified: Secondary | ICD-10-CM | POA: Diagnosis not present

## 2022-04-14 DIAGNOSIS — E785 Hyperlipidemia, unspecified: Secondary | ICD-10-CM | POA: Insufficient documentation

## 2022-04-14 DIAGNOSIS — R479 Unspecified speech disturbances: Secondary | ICD-10-CM | POA: Diagnosis not present

## 2022-04-14 LAB — CBC
HCT: 42.5 % (ref 36.0–46.0)
Hemoglobin: 14.1 g/dL (ref 12.0–15.0)
MCH: 31.3 pg (ref 26.0–34.0)
MCHC: 33.2 g/dL (ref 30.0–36.0)
MCV: 94.2 fL (ref 80.0–100.0)
Platelets: 79 10*3/uL — ABNORMAL LOW (ref 150–400)
RBC: 4.51 MIL/uL (ref 3.87–5.11)
RDW: 14.1 % (ref 11.5–15.5)
WBC: 3.3 10*3/uL — ABNORMAL LOW (ref 4.0–10.5)
nRBC: 0 % (ref 0.0–0.2)

## 2022-04-14 LAB — LIPID PANEL
Cholesterol: 148 mg/dL (ref 0–200)
HDL: 60 mg/dL (ref >40)
LDL Cholesterol: 73 mg/dL (ref 0–99)
Total CHOL/HDL Ratio: 2.5 RATIO
Triglycerides: 73 mg/dL (ref <150)
VLDL: 15 mg/dL (ref 0–40)

## 2022-04-14 LAB — DIFFERENTIAL
Abs Immature Granulocytes: 0.01 10*3/uL (ref 0.00–0.07)
Basophils Absolute: 0 10*3/uL (ref 0.0–0.1)
Basophils Relative: 1 %
Eosinophils Absolute: 0 10*3/uL (ref 0.0–0.5)
Eosinophils Relative: 0 %
Immature Granulocytes: 0 %
Lymphocytes Relative: 50 %
Lymphs Abs: 1.6 10*3/uL (ref 0.7–4.0)
Monocytes Absolute: 0.4 10*3/uL (ref 0.1–1.0)
Monocytes Relative: 12 %
Neutro Abs: 1.2 10*3/uL — ABNORMAL LOW (ref 1.7–7.7)
Neutrophils Relative %: 37 %

## 2022-04-14 LAB — COMPREHENSIVE METABOLIC PANEL
ALT: 19 U/L (ref 0–44)
AST: 33 U/L (ref 15–41)
Albumin: 4.3 g/dL (ref 3.5–5.0)
Alkaline Phosphatase: 46 U/L (ref 38–126)
Anion gap: 10 (ref 5–15)
BUN: 20 mg/dL (ref 8–23)
CO2: 22 mmol/L (ref 22–32)
Calcium: 9.8 mg/dL (ref 8.9–10.3)
Chloride: 107 mmol/L (ref 98–111)
Creatinine, Ser: 1.46 mg/dL — ABNORMAL HIGH (ref 0.44–1.00)
GFR, Estimated: 35 mL/min — ABNORMAL LOW (ref >60)
Glucose, Bld: 100 mg/dL — ABNORMAL HIGH (ref 70–99)
Potassium: 4 mmol/L (ref 3.5–5.1)
Sodium: 139 mmol/L (ref 135–145)
Total Bilirubin: 0.6 mg/dL (ref 0.3–1.2)
Total Protein: 7 g/dL (ref 6.5–8.1)

## 2022-04-14 LAB — URINALYSIS, ROUTINE W REFLEX MICROSCOPIC
Bilirubin Urine: NEGATIVE
Glucose, UA: NEGATIVE mg/dL
Hgb urine dipstick: NEGATIVE
Ketones, ur: NEGATIVE mg/dL
Leukocytes,Ua: NEGATIVE
Nitrite: POSITIVE — AB
Protein, ur: NEGATIVE mg/dL
Specific Gravity, Urine: 1.003 — ABNORMAL LOW (ref 1.005–1.030)
pH: 5 (ref 5.0–8.0)

## 2022-04-14 LAB — RAPID URINE DRUG SCREEN, HOSP PERFORMED
Amphetamines: NOT DETECTED
Barbiturates: NOT DETECTED
Benzodiazepines: NOT DETECTED
Cocaine: NOT DETECTED
Opiates: NOT DETECTED
Tetrahydrocannabinol: NOT DETECTED

## 2022-04-14 LAB — HEMOGLOBIN A1C
Hgb A1c MFr Bld: 5.5 % (ref 4.8–5.6)
Mean Plasma Glucose: 111.15 mg/dL

## 2022-04-14 LAB — RESP PANEL BY RT-PCR (FLU A&B, COVID) ARPGX2
Influenza A by PCR: NEGATIVE
Influenza B by PCR: NEGATIVE
SARS Coronavirus 2 by RT PCR: NEGATIVE

## 2022-04-14 LAB — ETHANOL: Alcohol, Ethyl (B): <10 mg/dL (ref <10)

## 2022-04-14 LAB — APTT: aPTT: 27 seconds (ref 24–36)

## 2022-04-14 LAB — PROTIME-INR
INR: 1.1 (ref 0.8–1.2)
Prothrombin Time: 13.7 seconds (ref 11.4–15.2)

## 2022-04-14 MED ORDER — SODIUM CHLORIDE 0.9 % IV SOLN
1.0000 g | Freq: Once | INTRAVENOUS | Status: AC
  Administered 2022-04-14: 1 g via INTRAVENOUS
  Filled 2022-04-14: qty 10

## 2022-04-14 MED ORDER — ATORVASTATIN CALCIUM 40 MG PO TABS: 80.0000 mg | ORAL_TABLET | Freq: Every day | ORAL | Status: DC

## 2022-04-14 MED ORDER — GADOBUTROL 1 MMOL/ML IV SOLN
10.0000 mL | Freq: Once | INTRAVENOUS | Status: AC | PRN
  Administered 2022-04-14: 10 mL via INTRAVENOUS

## 2022-04-14 MED ORDER — CEPHALEXIN 500 MG PO CAPS: 500.0000 mg | ORAL_CAPSULE | Freq: Three times a day (TID) | ORAL | 0 refills | Status: DC

## 2022-04-14 NOTE — ED Provider Notes (Signed)
Emergency Department Provider Note   I have reviewed the triage vital signs and the nursing notes.   HISTORY  Chief Complaint Aphasia   HPI Jasmin [name] is a 88 y.o. female with past history reviewed below including hypertension and hyperlipidemia presents to the emergency department with acute speech disturbance.  Patient last normal at 9:30 PM prior to going to bed.  She woke at 3:30 AM when her husband got up to go to the bathroom.  She tried to speak with him but noticed that she could not get out any words.  She felt like she could not make sounds at all.  She did not have headache, unilateral weakness/numbness, vision disturbance.  She estimates this went on for 30 to 45 minutes and then resolved.  EMS arrived on scene to find the patient back to her baseline with no focal neuro deficits.  Blood sugar 85.  Sinus rhythm.  No severe hypertension.    Past Medical History:  Diagnosis Date   Contact dermatitis and other eczema, due to unspecified cause    Diverticulosis of colon    Esophageal stricture    GERD with stricture    Dr Henrene Pastor   Gout    Hemorrhoids    Hiatal hernia    Hyperlipidemia    Hypertension    Onychomycosis    Personal history of urinary calculi    Polycystic kidney    Tubulovillous adenoma polyp of colon    Unspecified vitamin D deficiency    Vitamin B12 deficiency 2009   Borderline    Review of Systems  Constitutional: No fever/chills Eyes: No visual changes. ENT: No sore throat. Cardiovascular: Denies chest pain. Respiratory: Denies shortness of breath. Gastrointestinal: No abdominal pain.  No nausea, no vomiting.  No diarrhea.  No constipation. Genitourinary: Negative for dysuria. Musculoskeletal: Negative for back pain. Skin: Negative for rash. Neurological: Negative for headaches, focal weakness or numbness. Positive aphasia.    ____________________________________________   PHYSICAL EXAM:  VITAL SIGNS: Vitals:   04/14/22  0924 04/14/22 1056  BP:  (!) 141/68  Pulse:  67  Resp:  16  Temp: 97.7 F (36.5 C) 99.9 F (37.7 C)  SpO2:  100%    Constitutional: Alert and oriented. Well appearing and in no acute distress. Eyes: Conjunctivae are normal. EOMI.  Head: Atraumatic. Nose: No congestion/rhinnorhea. Mouth/Throat: Mucous membranes are moist.   Neck: No stridor.   Cardiovascular: Normal rate, regular rhythm. Good peripheral circulation. Grossly normal heart sounds.   Respiratory: Normal respiratory effort.  No retractions. Lungs CTAB. Gastrointestinal: Soft and nontender. No distention.  Musculoskeletal: No lower extremity tenderness nor edema. No gross deformities of extremities. Neurologic:  Normal speech and language. No gross focal neurologic deficits are appreciated. 5/5 strength in the bilateral upper/lower extremities. Normal sensation throughout. No facial asymmetry.  Skin:  Skin is warm, dry and intact. No rash noted.   ____________________________________________   LABS (all labs ordered are listed, but only abnormal results are displayed)  Labs Reviewed  CBC - Abnormal; Notable for the following components:      Result Value   WBC 3.3 (*)    Platelets 79 (*)    All other components within normal limits  DIFFERENTIAL - Abnormal; Notable for the following components:   Neutro Abs 1.2 (*)    All other components within normal limits  COMPREHENSIVE METABOLIC PANEL - Abnormal; Notable for the following components:   Glucose, Bld 100 (*)    Creatinine, Ser 1.46 (*)  GFR, Estimated 35 (*)    All other components within normal limits  URINALYSIS, ROUTINE W REFLEX MICROSCOPIC - Abnormal; Notable for the following components:   Color, Urine STRAW (*)    APPearance HAZY (*)    Specific Gravity, Urine 1.003 (*)    Nitrite POSITIVE (*)    Bacteria, UA MANY (*)    All other components within normal limits  RESP PANEL BY RT-PCR (FLU A&B, COVID) ARPGX2  ETHANOL  PROTIME-INR  APTT  RAPID  URINE DRUG SCREEN, HOSP PERFORMED  HEMOGLOBIN A1C  LIPID PANEL   ____________________________________________  EKG   EKG Interpretation  Date/Time:  Wednesday April 14 2022 06:13:10 EDT Ventricular Rate:  65 PR Interval:  244 QRS Duration: 88 QT Interval:  403 QTC Calculation: 419 R Axis:   17 Text Interpretation: Sinus rhythm Prolonged PR interval Low voltage, precordial leads Confirmed by Nanda Quinton [phone]) on 04/14/2022 6:31:01 AM        ____________________________________________   PROCEDURES  Procedure(s) performed:   Procedures  None  ____________________________________________   INITIAL IMPRESSION / ASSESSMENT AND PLAN / ED COURSE  Pertinent labs & imaging results that were available during my care of the patient were reviewed by me and considered in my medical decision making (see chart for details).   This patient is Presenting for Evaluation of AMS, which does require a range of treatment options, and is a complaint that involves a high risk of morbidity and mortality.  The Differential Diagnoses includes but is not exclusive to alcohol, illicit or prescription medications, intracranial pathology such as stroke, intracerebral hemorrhage, fever or infectious causes including sepsis, hypoxemia, uremia, trauma, endocrine related disorders such as diabetes, hypoglycemia, thyroid-related diseases, etc.   I did obtain Additional Historical Information from EMS.   I decided to review pertinent External Data, and in summary no prior CVA/TIA history.    Clinical Laboratory Tests Ordered, included COVID test is negative.  Normal electrolytes.  Mild CKD.  UA pending. No anemia on CBC.   Radiologic Tests Ordered, included CT head. I independently interpreted the images and agree with radiology interpretation.   Cardiac Monitor Tracing which shows NSR.   Social Determinants of Health Risk patient is a non-smoker.   Consult complete with Neurology, Dr.  Curly Shores.   Medical Decision Making: Summary:  Patient arrives to the emergency department with acute speech disturbance with return to normal function prior to ED presentation.  She is not a code stroke in this setting.  She is neuro intact on my initial evaluation.  She is at higher risk for stroke/TIA.   Reevaluation with update and discussion with patient. Neurology to evaluate and follow MRI. Care transferred to Dr. Ashok Cordia.   Disposition: pending  ____________________________________________  FINAL CLINICAL IMPRESSION(S) / ED DIAGNOSES  Final diagnoses:  Speech disturbance, unspecified type  Generalized weakness  Stage 3b chronic kidney disease (HCC)  Thrombocytopenia (HCC)  Urinary tract infection without hematuria, site unspecified     NEW OUTPATIENT MEDICATIONS STARTED DURING THIS VISIT:  Discharge Medication List as of 04/14/2022 10:47 AM     START taking these medications   Details  cephALEXin (KEFLEX) 500 MG capsule Take 1 capsule (500 mg total) by mouth 3 (three) times daily., Starting Wed 04/14/2022, Print        Note:  This document was prepared using Dragon voice recognition software and may include unintentional dictation errors.  Nanda Quinton, MD, [facility] Emergency Medicine    Devantae Babe, Wonda Olds, MD 04/14/22 [phone]

## 2022-04-14 NOTE — Consult Note (Signed)
Neurology Consultation Reason for Consult: Transient difficulty speaking/moving Requesting Physician: Nanda Quinton  CC: Reduced responsiveness, possible aphasia  History is obtained from: Wife and husband as well as chart review  HPI: Jasmin [name] is a 88 y.o. female with a past medical history significant for hypertension, hyperlipidemia, aortic atherosclerosis, polycystic kidney disease, CKD, gout, thrombocytopenia  She has been having a gout attack of the left foot which has made sleep very challenging for the last few days.  Last night she awoke and could not move her and could not speak.  She states she recalls her husband talking to her but could not respond to him.  He provides a slightly different history stating that even when he was touching her and shaking her lightly she was unresponsive.  However by the time EMS arrived her symptoms had resolved.  He estimates her symptoms been ongoing for more than an hour.  LKW: 10 PM tPA given?: No, back to baseline (too mild to treat) IA performed?: No, exam not consistent with LVO Premorbid modified rankin scale:      0 - No symptoms.  ROS: All other review of systems was negative except as noted in the HPI.  Past Medical History:  Diagnosis Date   Contact dermatitis and other eczema, due to unspecified cause    Diverticulosis of colon    Esophageal stricture    GERD with stricture    Dr Henrene Pastor   Gout    Hemorrhoids    Hiatal hernia    Hyperlipidemia    Hypertension    Onychomycosis    Personal history of urinary calculi    Polycystic kidney    Tubulovillous adenoma polyp of colon    Unspecified vitamin D deficiency    Vitamin B12 deficiency 2009   Borderline   Past Surgical History:  Procedure Laterality Date   CATARACT EXTRACTION Bilateral 08/24/2015   COLONOSCOPY     DILATION AND CURETTAGE OF UTERUS  08/24/1975   TOE SURGERY  08/23/2010   Right foot    TONSILLECTOMY     as child   Current Outpatient  Medications  Medication Instructions   acetaminophen (TYLENOL) 500 mg, Oral, Every 6 hours PRN   Cyanocobalamin (VITAMIN B-12 PO) 1,000 mcg, Oral, Daily   famotidine (PEPCID) 40 MG tablet TAKE 1 TABLET BY MOUTH  DAILY   gabapentin (NEURONTIN) 100-200 mg, Oral, Daily at bedtime   hydrocortisone (ANUSOL-HC) 2.5 % rectal cream 1 application , Rectal, At bedtime PRN   lidocaine (LIDODERM) 5 % 1 patch, Transdermal, Every 24 hours, Remove & Discard patch within 12 hours or as directed by MD   lovastatin (MEVACOR) 40 MG tablet TAKE 1 TABLET BY MOUTH AT  BEDTIME   methylPREDNISolone (MEDROL DOSEPAK) 4 MG TBPK tablet Take as directed on package   metoprolol tartrate (LOPRESSOR) 50 mg, Oral, 2 times daily   polyethylene glycol powder (GLYCOLAX/MIRALAX) 17 g, Oral, 2 times daily PRN   spironolactone (ALDACTONE) 50 MG tablet TAKE 1 TABLET BY MOUTH  DAILY     Family History  Problem Relation Age of Onset   Hypertension Father    Colon cancer Neg Hx    Esophageal cancer Neg Hx    Rectal cancer Neg Hx    Stomach cancer Neg Hx    Social History:  reports that she has never smoked. She has never used smokeless tobacco. She reports that she does not drink alcohol and does not use drugs.   Exam: Current vital signs: BP 127/62  Pulse 66   Temp 97.8 F (36.6 C) (Oral)   Resp 18   Ht '5\' 6"'$  (1.676 m)   Wt 100.2 kg   SpO2 96%   BMI 35.65 kg/m  Vital signs in last 24 hours: Temp:  [97.8 F (36.6 C)] 97.8 F (36.6 C) (08/23 0523) Pulse Rate:  [66] 66 (08/23 0523) Resp:  [18] 18 (08/23 0523) BP: (127)/(62) 127/62 (08/23 0523) SpO2:  [96 %] 96 % (08/23 0523) Weight:  [100.2 kg] 100.2 kg (08/23 0458)   Physical Exam  Constitutional: Appears well-developed and well-nourished.  Psych: Affect appropriate to situation, calm and cooperative Eyes: No scleral injection HENT: No oropharyngeal obstruction.  MSK: no joint deformities.  Cardiovascular: Perfusing extremities well Respiratory: Effort  normal, non-labored breathing GI: Soft.  No distension. There is no tenderness.  Skin: Warm dry and intact visible skin  Neuro: Mental Status: Patient is awake, alert, oriented to person, place, month, year, and situation. Patient is able to give a clear and coherent history. No signs of aphasia or neglect Cranial Nerves: II: Visual Fields are full. Pupils are equal, round, and reactive to light.   III,IV, VI: EOMI without ptosis or diploplia.  V: Facial sensation is symmetric to temperature VII: Facial movement is symmetric.  VIII: hearing is intact to voice X: Uvula elevates symmetrically XI: Shoulder shrug is symmetric. XII: tongue is midline without atrophy or fasciculations.  Motor: Tone is normal. Bulk is normal. 5/5 strength was present in all four extremities; other than mild bilateral hip flexion weakness slightly worse on the right than the left; additionally left foot dorsiflexion and plantarflexion declined secondary to pain.  Sensory: Sensation is symmetric to light touch and temperature in the arms and legs. Deep Tendon Reflexes: 3+ and symmetric in the brachioradialis and patellae.  Cerebellar: FNF and HKS are intact bilaterally  NIHSS total 0   I have reviewed labs in epic and the results pertinent to this consultation are:  Basic Metabolic Panel: Recent Labs  Lab 04/14/22 0604  NA 139  K 4.0  CL 107  CO2 22  GLUCOSE 100*  BUN 20  CREATININE 1.46*  CALCIUM 9.8    CBC: Recent Labs  Lab 04/14/22 0604  WBC 3.3*  NEUTROABS 1.2*  HGB 14.1  HCT 42.5  MCV 94.2  PLT 79*    Coagulation Studies: Recent Labs    04/14/22 0604  LABPROT 13.7  INR 1.1      I have reviewed the images obtained:  Head CT personally reviewed, agree with radiology:   No acute intracranial abnormality. Mild to moderate for age cerebral white matter changes are stable since 2019. Evidence of a chronic, slowly enlarging left middle cranial fossa Meningioma. Suspect  this is clinically silent. But in light of this finding the scattered dural calcifications could reflect additional smaller meningiomas.  Impression: Given her severe sleep deprivation, I favor this event to be sleep paralysis/deep sleep based on description.  However given her age and risk factors, will obtain MRI brain with without contrast (to better delineate incidental meningioma as well)  Recommendations: -MRI brain with and without contrast -If this study is negative, and patient is appropriate for discharge and follow-up with PCP (full medical clearance per ED) -If study is positive admit for stroke work-up (MRA head and neck, carotid ultrasound, lipid, A1c, echocardiogram  Lesleigh Noe MD-PhD [facility] [phone] Available 7 PM to 7 AM, outside of these hours please call Neurologist on call as listed on Amion.

## 2022-04-14 NOTE — ED Provider Notes (Signed)
Signed out by Dr Laverta Baltimore that if MRI neg, probable d/c to home.   Neurology note noted:  Recommendations: -MRI brain with and without contrast -If this study is negative, and patient is appropriate for discharge and follow-up with PCP (full medical clearance per ED) -If study is positive admit for stroke work-up (MRA head and neck, carotid ultrasound, lipid, A1c, echocardiogram   Lesleigh Noe MD-PhD   MRI is neg for cva. NIHSS 0. No current aphasia or other acute neurologic symptoms.   Pt currently appears stable for d/c.   Rec outpatient pcp/neuro f/u.  Return precautions provided.     Lajean Saver, MD 04/14/22 [phone]

## 2022-04-14 NOTE — Discharge Instructions (Signed)
It was our pleasure to provide your ER care today - we hope that you feel better.  Drink plenty of fluids/stay well hydrated. Get adequate rest.   Your lab tests show a possible urine infection - take antibiotic (keflex) as prescribed.  For earlier symptoms, follow up closely with primary care doctor and neurology in the coming week.  Return to ER right away if worse, new symptoms, fevers, one-sided numbness/weakness, change in speech or vision, chest pain, trouble breathing, or other concern.

## 2022-04-14 NOTE — Progress Notes (Addendum)
STROKE TEAM PROGRESS NOTE   INTERVAL HISTORY Patient is seen in the ED with her husband at the bedside.  She reports that a few days ago, she developed severe left foot pain which made it difficult to sleep.  Last night, she was able to sleep but had an episode where her eyes were open and she could hear her husband speaking to her but could not move and could not respond.  Her husband called EMS and she was brought to the ED.  Symptoms have completely resolved at this point and MRI was negative for stroke.  Vitals:   04/14/22 0458 04/14/22 0523 04/14/22 0741 04/14/22 0924  BP:  127/62 124/67   Pulse:  66 60   Resp:  18 19   Temp:  97.8 F (36.6 C)  97.7 F (36.5 C)  TempSrc:  Oral  Oral  SpO2:  96% 98%   Weight: 100.2 kg     Height: '5\' 6"'$  (1.676 m)      CBC:  Recent Labs  Lab 04/14/22 0604  WBC 3.3*  NEUTROABS 1.2*  HGB 14.1  HCT 42.5  MCV 94.2  PLT 79*   Basic Metabolic Panel:  Recent Labs  Lab 04/14/22 0604  NA 139  K 4.0  CL 107  CO2 22  GLUCOSE 100*  BUN 20  CREATININE 1.46*  CALCIUM 9.8   Lipid Panel: No results for input(s): "CHOL", "TRIG", "HDL", "CHOLHDL", "VLDL", "LDLCALC" in the last 168 hours. HgbA1c: No results for input(s): "HGBA1C" in the last 168 hours. Urine Drug Screen:  Recent Labs  Lab 04/14/22 0753  LABOPIA NONE DETECTED  COCAINSCRNUR NONE DETECTED  LABBENZ NONE DETECTED  AMPHETMU NONE DETECTED  THCU NONE DETECTED  LABBARB NONE DETECTED    Alcohol Level  Recent Labs  Lab 04/14/22 0604  ETH <10    IMAGING past 24 hours MR BRAIN W WO CONTRAST  Result Date: 04/14/2022 CLINICAL DATA:  TIA.  Difficulty moving speaking last night. EXAM: MRI HEAD WITHOUT AND WITH CONTRAST MRA HEAD WITHOUT CONTRAST TECHNIQUE: Multiplanar, multi-echo pulse sequences of the brain and surrounding structures were acquired without and with intravenous contrast. Angiographic images of the Circle of Willis were acquired using MRA technique without intravenous  contrast. CONTRAST:  10 cc Gadavist COMPARISON:  Same-day CT head FINDINGS: MRI HEAD FINDINGS Brain: There is no acute intracranial hemorrhage, extra-axial fluid collection, or acute infarct. There is mild parenchymal volume loss with prominence of the extra-axial CSF spaces. There is proportionate hippocampal atrophy. The ventricles are normal in size. Gray-white differentiation is preserved. Patchy and confluent FLAIR signal abnormality in the supratentorial brain likely reflects sequela of mild-to-moderate chronic white matter microangiopathy. There is no suspicious parenchymal signal abnormality. There is a homogeneously enhancing extra-axial lesion overlying the left anterior temporal lobe measuring 1.7 cm x 1.5 cm by 1.7 cm, most consistent with a meningioma. There is no mass effect on the underlying brain parenchyma. There is no vascular encasement or evidence of cavernous sinus invasion. There is no midline shift. There is no other abnormal enhancement or mass lesion. Vascular: See below. Skull and upper cervical spine: Normal marrow signal. Sinuses/Orbits: The paranasal sinuses are clear. Bilateral lens implants are in place. The globes and orbits are otherwise unremarkable. Other: None. MRA HEAD FINDINGS Anterior circulation: The intracranial ICAs are patent. The bilateral MCAs are patent without significant stenosis or occlusion. The bilateral ACAs are patent without significant stenosis or occlusion. The anterior communicating artery is normal. There is no aneurysm or  AVM. Posterior circulation: The bilateral V4 segments are patent. The basilar artery is patent. The major cerebellar arteries appear patent. The bilateral PCAs are patent. The posterior communicating arteries are not identified. There is no aneurysm or AVM. Anatomic variants: None. IMPRESSION: 1. No acute intracranial pathology. 2. 1.7 cm homogeneously enhancing lesion overlying the left anterior temporal lobe consistent with a meningioma.  No mass effect on the underlying brain parenchyma. No other meningiomas. 3. Normal intracranial MRA. Electronically Signed   By: Valetta Mole M.D.   On: 04/14/2022 08:59   MR ANGIO HEAD WO CONTRAST  Result Date: 04/14/2022 CLINICAL DATA:  TIA.  Difficulty moving speaking last night. EXAM: MRI HEAD WITHOUT AND WITH CONTRAST MRA HEAD WITHOUT CONTRAST TECHNIQUE: Multiplanar, multi-echo pulse sequences of the brain and surrounding structures were acquired without and with intravenous contrast. Angiographic images of the Circle of Willis were acquired using MRA technique without intravenous contrast. CONTRAST:  10 cc Gadavist COMPARISON:  Same-day CT head FINDINGS: MRI HEAD FINDINGS Brain: There is no acute intracranial hemorrhage, extra-axial fluid collection, or acute infarct. There is mild parenchymal volume loss with prominence of the extra-axial CSF spaces. There is proportionate hippocampal atrophy. The ventricles are normal in size. Gray-white differentiation is preserved. Patchy and confluent FLAIR signal abnormality in the supratentorial brain likely reflects sequela of mild-to-moderate chronic white matter microangiopathy. There is no suspicious parenchymal signal abnormality. There is a homogeneously enhancing extra-axial lesion overlying the left anterior temporal lobe measuring 1.7 cm x 1.5 cm by 1.7 cm, most consistent with a meningioma. There is no mass effect on the underlying brain parenchyma. There is no vascular encasement or evidence of cavernous sinus invasion. There is no midline shift. There is no other abnormal enhancement or mass lesion. Vascular: See below. Skull and upper cervical spine: Normal marrow signal. Sinuses/Orbits: The paranasal sinuses are clear. Bilateral lens implants are in place. The globes and orbits are otherwise unremarkable. Other: None. MRA HEAD FINDINGS Anterior circulation: The intracranial ICAs are patent. The bilateral MCAs are patent without significant stenosis or  occlusion. The bilateral ACAs are patent without significant stenosis or occlusion. The anterior communicating artery is normal. There is no aneurysm or AVM. Posterior circulation: The bilateral V4 segments are patent. The basilar artery is patent. The major cerebellar arteries appear patent. The bilateral PCAs are patent. The posterior communicating arteries are not identified. There is no aneurysm or AVM. Anatomic variants: None. IMPRESSION: 1. No acute intracranial pathology. 2. 1.7 cm homogeneously enhancing lesion overlying the left anterior temporal lobe consistent with a meningioma. No mass effect on the underlying brain parenchyma. No other meningiomas. 3. Normal intracranial MRA. Electronically Signed   By: Valetta Mole M.D.   On: 04/14/2022 08:59   CT HEAD WO CONTRAST  Addendum Date: 04/14/2022   ADDENDUM REPORT: 04/14/2022 06:28 ADDENDUM: Dr. Curly Shores called to discuss this head CT at 0609 hours, and questions the presence of a left middle cranial fossa meningioma. And in addition to the multiple scattered para falcine and peripheral dural calcifications, there is strong evidence of a chronic noncalcified Meningioma in the left middle cranial fossa on series 3, image 10 (approximally 2 cm long axis now versus 17 mm in 2019). No associated left temporal lobe mass effect or edema. CONCLUSION: Evidence of a chronic, slowly enlarging left middle cranial fossa Meningioma. Suspect this is clinically silent. But in light of this finding the scattered dural calcifications could reflect additional smaller meningiomas. Study discussed by telephone with Dr. Curly Shores on 04/14/2022  at 0609 hours. She plans of follow-up MRI head, and agreed to without and with contrast to further evaluate meningiomas. Electronically Signed   By: Genevie Ann M.D.   On: 04/14/2022 06:28   Result Date: 04/14/2022 CLINICAL DATA:  88 year old female with transient aphasia beginning at 0330 hours today. TIA. EXAM: CT HEAD WITHOUT CONTRAST  TECHNIQUE: Contiguous axial images were obtained from the base of the skull through the vertex without intravenous contrast. RADIATION DOSE REDUCTION: This exam was performed according to the departmental dose-optimization program which includes automated exposure control, adjustment of the mA and/or kV according to patient size and/or use of iterative reconstruction technique. COMPARISON:  Head CT 03/30/2018. FINDINGS: Brain: Scattered chronic dural calcifications appear stable since 2019. Cerebral volume is within normal limits for age. No midline shift, ventriculomegaly, mass effect, evidence of mass lesion, intracranial hemorrhage or evidence of cortically based acute infarction. Moderate patchy and confluent posterior periventricular white matter hypodensity in both hemispheres appears stable. Vascular: Calcified atherosclerosis at the skull base. No suspicious intracranial vascular hyperdensity. Skull: Stable.  Hyperostosis frontalis. Sinuses/Orbits: Visualized paranasal sinuses and mastoids are stable and well aerated. Other: No acute orbit or scalp soft tissue finding. IMPRESSION: No acute intracranial abnormality. Mild to moderate for age cerebral white matter changes are stable since 2019. Electronically Signed: By: Genevie Ann M.D. On: 04/14/2022 05:56    PHYSICAL EXAM  General:  Alert, well-nourished, well-developed patient in no acute distress Respiratory:  Regular, unlabored respirations on room air  NEURO:  Mental Status: AA&Ox3  Speech/Language: speech is without dysarthria or aphasia.  Fluency, and comprehension intact.  Cranial Nerves:  II: PERRL. Visual fields full.  III, IV, VI: EOMI. Eyelids elevate symmetrically.  V: Sensation is intact to light touch and symmetrical to face.  VII: Smile is symmetrical.   VIII: hearing intact to voice. IX, X:Phonation is normal.  XII: tongue is midline without fasciculations. Motor: 5/5 strength to all muscle groups tested.  Tone: is normal and  bulk is normal Sensation- Intact to light touch bilaterally.  Coordination: FTN intact bilaterally.No drift.  Gait- deferred    ASSESSMENT/PLAN Jasmin [name] is a 88 y.o. female with history of HTN, HLD, CKD, polycystic kidney disease and gout presenting with an episode of unresponsiveness with intact awareness. A few days ago, patient had an episode of severe left foot pain which made it difficult to sleep.  She was seen in the ED for this.  Last night, she was able to sleep but had an episode where her eyes were open and she could hear her husband speaking to her but could not move and could not respond.  Her husband called EMS and she was brought to the ED.  Symptoms have completely resolved at this point and MRI was negative for stroke.  Possible Sleep paralysis in setting of disturbed sleep CT head No acute abnormality. Small vessel disease.  MRI  No acute abnormality, 1.7 cm lesion in left anterior temporal lobe, likely meningioma MRA  Normal intracranial MRA LDL pending HgbA1c 6.0 VTE prophylaxis - SCDs No antithrombotic prior to admission, now on No antithrombotic secondary to thrombocytopenia and not considered vascular event this time Per Dr. Curly Shores, MRI negative for stroke, no further work up needed.   Hypertension Home meds:  metoprolol 50 mg BID Stable Long-term BP goal normotensive  Hyperlipidemia Home meds:  lovastatin 40 mg daily LDL pending, goal < 70 Continue statin at discharge  Other Stroke Risk Factors Advanced Age >/= 90  Obesity, Body mass index is 35.65 kg/m., BMI >/= 30 associated with increased stroke risk, recommend weight loss, diet and exercise as appropriate    Other Active Problems Gout Continue home [facility] day # [place] , MSN, AGACNP-BC [facility] See Amion for schedule and pager information 04/14/2022 10:35 AM    ATTENDING NOTE: I reviewed above note and agree with the assessment  and plan. Follow up with PCP after discharge. Neuro follow up as needed.   Rosalin Hawking, MD PhD Stroke Neurology 04/14/2022 10:49 AM    To contact Stroke Continuity provider, please refer to [email or web address] After hours, contact General Neurology

## 2022-04-14 NOTE — ED Notes (Signed)
Patient transported to MRI 

## 2022-04-14 NOTE — ED Triage Notes (Signed)
Pt BIB EMS from home. LKN 2230 yesterday. Pt husband reports around 0330 pt began having aphasia, not able to get words out. EMS reports around 30-45 min later aphasia had resolved. No prior hx of stroke. VS with EMS  146/76 NSR  CBG 85

## 2022-04-14 NOTE — ED Notes (Signed)
Neurology at bedside.

## 2022-04-16 ENCOUNTER — Telehealth: Payer: Self-pay | Admitting: Internal Medicine

## 2022-04-16 NOTE — Telephone Encounter (Signed)
Called pt x's 2 no answer x's 10 rings.Marland KitchenJohny [name]

## 2022-04-16 NOTE — Telephone Encounter (Signed)
Patient states she was recently in ER and prescribed medication for gout. She read the list of side effects and she is concerned that the medication might not be safe for her to take. She is requesting call back to discuss at [phone]

## 2022-05-04 ENCOUNTER — Ambulatory Visit (INDEPENDENT_AMBULATORY_CARE_PROVIDER_SITE_OTHER): Payer: Medicare Other | Admitting: *Deleted

## 2022-05-04 DIAGNOSIS — Z23 Encounter for immunization: Secondary | ICD-10-CM | POA: Diagnosis not present

## 2022-05-04 NOTE — Progress Notes (Signed)
flu

## 2022-05-07 ENCOUNTER — Encounter (HOSPITAL_COMMUNITY): Payer: Self-pay

## 2022-05-07 ENCOUNTER — Ambulatory Visit (HOSPITAL_COMMUNITY)
Admission: EM | Admit: 2022-05-07 | Discharge: 2022-05-07 | Disposition: A | Discharge status: Home / Self Care | Payer: Medicare Other | Attending: Internal Medicine | Admitting: Internal Medicine

## 2022-05-07 DIAGNOSIS — M10371 Gout due to renal impairment, right ankle and foot: Secondary | ICD-10-CM | POA: Diagnosis not present

## 2022-05-07 MED ORDER — METHYLPREDNISOLONE SODIUM SUCC 125 MG IJ SOLR
80.0000 mg | Freq: Once | INTRAMUSCULAR | Status: AC
Start: 2022-05-07 — End: 2022-05-07
  Administered 2022-05-07: 80 mg via INTRAMUSCULAR

## 2022-05-07 MED ORDER — PREDNISONE 20 MG PO TABS: 40.0000 mg | ORAL_TABLET | Freq: Every day | ORAL | 0 refills | Status: DC

## 2022-05-07 MED ORDER — METHYLPREDNISOLONE SODIUM SUCC 125 MG IJ SOLR
INTRAMUSCULAR | Status: AC
  Filled 2022-05-07: qty 2

## 2022-05-07 NOTE — ED Provider Notes (Signed)
[place]    CSN: [id number] Arrival date & time: 05/07/22  1049      History   Chief Complaint Chief Complaint  Patient presents with   Gout    HPI Jasmin [name] is a 88 y.o. female.   Patient presents urgent care for evaluation of pain to the right pinky toe that started yesterday.  She states that this feels like a gout attack and has a history of gout to bilateral feet.  She was recently treated for a gout flareup to the left great toe 2 weeks ago with a steroid Dosepak but has not had any of this for the last 2 weeks.  Pinky toe is red, swollen, and very painful to touch.  Denies recent falls/injuries or trauma to the area.  Pain is currently a 10 on a scale of 0-10 to the right pinky toe and is worse with walking.  She is able to move all 5 toes on the right foot without difficulty.  Walks with a slight limp due to pain to the right pinky toe.  She has a history of chronic kidney disease and does not currently take any medications for gout prevention.  She called her primary care doctor this morning to attempt to schedule an appointment but was told that he is out of town.  She scheduled an appointment for May 19, 2022 to discuss gout further after urgent care appointment.  She has been taking Tylenol at home without relief of symptoms.  Denies recent intake of red meat, soda, and fish.     Past Medical History:  Diagnosis Date   Contact dermatitis and other eczema, due to unspecified cause    Diverticulosis of colon    Esophageal stricture    GERD with stricture    Dr Henrene Pastor   Gout    Hemorrhoids    Hiatal hernia    Hyperlipidemia    Hypertension    Onychomycosis    Personal history of urinary calculi    Polycystic kidney    Tubulovillous adenoma polyp of colon    Unspecified vitamin D deficiency    Vitamin B12 deficiency 2009   Borderline    Patient Active Problem List   Diagnosis Date Noted   Epistaxis 07/29/2021   Hyperglycemia  01/27/2021   Aortic atherosclerosis ([place]) 11/10/2020   Nocturia 01/28/2020   RLQ abdominal pain 06/07/2019   Bilateral primary osteoarthritis of knee 09/13/2018   Constipation 07/06/2018   Peripheral neuropathic pain 12/28/2017   Gout 08/30/2017   History of colonic polyps 08/13/2016   Lipoma of arm 09/30/2014   WBC decreased 05/22/2013   Low back pain 11/16/2012   Obesity 05/31/2012   Dyspnea 03/10/2012   Edema 03/10/2012   Memory problem 11/24/2011   Internal hemorrhoid 05/20/2011   Well adult exam 11/17/2010   ONYCHOMYCOSIS 06/10/2009   DYSPHAGIA 09/10/2008   B12 deficiency 04/15/2008   ESOPHAGEAL STRICTURE 12/05/2007   Hiatal hernia 12/05/2007   FRACTURE, RIGHT LEG 12/05/2007   NEPHROLITHIASIS, HX OF 12/05/2007   PARESTHESIA 09/22/2007   GERD 08/09/2007   Vitamin D deficiency 03/20/2007   Dyslipidemia 03/20/2007   Essential hypertension 03/20/2007   Diverticulosis of colon 03/20/2007    Past Surgical History:  Procedure Laterality Date   CATARACT EXTRACTION Bilateral 08/24/2015   COLONOSCOPY     DILATION AND CURETTAGE OF UTERUS  08/24/1975   TOE SURGERY  08/23/2010   Right foot    TONSILLECTOMY     as child  OB History   No obstetric history on file.      Home Medications    Prior to Admission medications   Medication Sig Start Date End Date Taking? Authorizing Provider  famotidine (PEPCID) 40 MG tablet TAKE 1 TABLET BY MOUTH  DAILY 11/12/21  Yes Plotnikov, Evie Lacks, MD  lovastatin (MEVACOR) 40 MG tablet TAKE 1 TABLET BY MOUTH AT  BEDTIME 03/18/22  Yes Plotnikov, Evie Lacks, MD  metoprolol tartrate (LOPRESSOR) 50 MG tablet TAKE 1 TABLET BY MOUTH TWICE  DAILY 03/18/22  Yes Plotnikov, Evie Lacks, MD  predniSONE (DELTASONE) 20 MG tablet Take 2 tablets (40 mg total) by mouth daily for 5 days. 05/07/22 05/12/22 Yes Talbot Grumbling, FNP  acetaminophen (TYLENOL) 500 MG tablet Take 1 tablet (500 mg total) by mouth every 6 (six) hours as needed. Patient taking  differently: Take 1,000 mg by mouth every 6 (six) hours as needed for moderate pain or headache. 07/25/20   Zigmund Gottron, NP  cephALEXin (KEFLEX) 500 MG capsule Take 1 capsule (500 mg total) by mouth 3 (three) times daily. 04/14/22   Lajean Saver, MD  Cyanocobalamin (VITAMIN B-12 PO) Take 1,000 mcg by mouth daily.    [provider]  gabapentin (NEURONTIN) 100 MG capsule Take 1-2 capsules (100-200 mg total) by mouth at bedtime. 07/27/21   Plotnikov, Evie Lacks, MD  hydrocortisone (ANUSOL-HC) 2.5 % rectal cream Place 1 application rectally at bedtime as needed for hemorrhoids or anal itching. 02/18/21   Irene Shipper, MD  lidocaine (LIDODERM) 5 % Place 1 patch onto the skin daily. Remove & Discard patch within 12 hours or as directed by MD 02/14/22   Sherwood Gambler, MD  polyethylene glycol powder (GLYCOLAX/MIRALAX) powder Take 17 g by mouth 2 (two) times daily as needed for mild constipation, moderate constipation or severe constipation. Patient taking differently: Take 17 g by mouth as needed for mild constipation, moderate constipation or severe constipation. 07/06/18   Plotnikov, Evie Lacks, MD  spironolactone (ALDACTONE) 50 MG tablet TAKE 1 TABLET BY MOUTH  DAILY 11/12/21   Plotnikov, Evie Lacks, MD    Family History Family History  Problem Relation Age of Onset   Hypertension Father    Colon cancer Neg Hx    Esophageal cancer Neg Hx    Rectal cancer Neg Hx    Stomach cancer Neg Hx     Social History Social History   Tobacco Use   Smoking status: Never   Smokeless tobacco: Never  Vaping Use   Vaping Use: Never used  Substance Use Topics   Alcohol use: No    Alcohol/week: 0.0 standard drinks of alcohol   Drug use: No     Allergies   Allopurinol, Doxycycline, Hctz [hydrochlorothiazide], Omeprazole, Oxybutynin, Sulfonamide derivatives, Clarithromycin, and Penicillins   Review of Systems Review of Systems Per HPI  Physical Exam Triage Vital Signs ED Triage Vitals   Enc Vitals Group     BP 05/07/22 1227 (!) 146/54     Pulse Rate 05/07/22 1227 (!) 59     Resp 05/07/22 1227 16     Temp 05/07/22 1227 97.7 F (36.5 C)     Temp Source 05/07/22 1227 Oral     SpO2 05/07/22 1227 97 %     Weight --      Height --      Head Circumference --      Peak Flow --      Pain Score 05/07/22 1231 10  Pain Loc --      Pain Edu? --      Excl. in [place]? --    No data found.  Updated Vital Signs BP (!) 146/54 (BP Location: Right Arm)   Pulse (!) 59   Temp 97.7 F (36.5 C) (Oral)   Resp 16   SpO2 97%   Visual Acuity Right Eye Distance:   Left Eye Distance:   Bilateral Distance:    Right Eye Near:   Left Eye Near:    Bilateral Near:     Physical Exam Vitals and nursing note reviewed.  Constitutional:      Appearance: Normal appearance. She is not ill-appearing or toxic-appearing.     Comments: Very pleasant patient sitting on exam in position of comfort table in no acute distress.   HENT:     Head: Normocephalic and atraumatic.     Right Ear: Hearing and external ear normal.     Left Ear: Hearing and external ear normal.     Nose: Nose normal.     Mouth/Throat:     Lips: Pink.     Mouth: Mucous membranes are moist.  Eyes:     General: Lids are normal. Vision grossly intact. Gaze aligned appropriately.     Extraocular Movements: Extraocular movements intact.     Conjunctiva/sclera: Conjunctivae normal.  Cardiovascular:     Rate and Rhythm: Normal rate and regular rhythm.     Heart sounds: Normal heart sounds, S1 normal and S2 normal.  Pulmonary:     Effort: Pulmonary effort is normal. No respiratory distress.     Breath sounds: Normal breath sounds and air entry.  Abdominal:     Palpations: Abdomen is soft.  Musculoskeletal:     Cervical back: Neck supple.     Comments: Swelling, erythema, and tenderness present to the right pinky toe.  Range of motion is normal and not limited by tenderness.  +2 dorsalis pedis pulses bilaterally present.   Patient walks with a steady gait that is slightly antalgic due to pain to the right pinky toe.  Capillary refill is less than 3.  Skin:    General: Skin is warm and dry.     Capillary Refill: Capillary refill takes less than 2 seconds.     Findings: No rash.  Neurological:     General: No focal deficit present.     Mental Status: She is alert and oriented to person, place, and time. Mental status is at baseline.     Cranial Nerves: No dysarthria or facial asymmetry.     Gait: Gait is intact.  Psychiatric:        Mood and Affect: Mood normal.        Speech: Speech normal.        Behavior: Behavior normal.        Thought Content: Thought content normal.        Judgment: Judgment normal.      UC Treatments / Results  Labs (all labs ordered are listed, but only abnormal results are displayed) Labs Reviewed - No data to display  EKG   Radiology No results found.  Procedures Procedures (including critical care time)  Medications Ordered in UC Medications  methylPREDNISolone sodium succinate (SOLU-MEDROL) 125 mg/2 mL injection 80 mg (80 mg Intramuscular Given 05/07/22 1310)    Initial Impression / Assessment and Plan / UC Course  I have reviewed the triage vital signs and the nursing notes.  Pertinent labs & imaging results that were  available during my care of the patient were reviewed by me and considered in my medical decision making (see chart for details).   1.  Acute gout due to renal impairment involving the toe of the right foot Symptoms and physical exam are consistent with acute gouty arthritis of the right pinky toe.  Patient was recently on a steroid Dosepak 2 weeks ago.  We will treat this with a steroid injection of 80 mg Solu-Medrol in the clinic today for acute pain relief.  Patient is to begin taking 40 mg prednisone burst starting tomorrow for the next 5 days with food to avoid stomach upset.  Advised no NSAIDs due to CKD as well as prednisone administration.   She may take Tylenol every 6 hours as needed for continued pain.  Advised ice and elevation.  Low purine eating plan provided in handout.  Advised to follow-up with primary care as scheduled on May 19, 2022 to discuss future gout prevention and follow-up on current flare.   Discussed physical exam and available lab work findings in clinic with patient.  Counseled patient regarding appropriate use of medications and potential side effects for all medications recommended or prescribed today. Discussed red flag signs and symptoms of worsening condition,when to call the PCP office, return to urgent care, and when to seek higher level of care in the emergency department. Patient verbalizes understanding and agreement with plan. All questions answered. Patient discharged in stable condition.      Final Clinical Impressions(s) / UC Diagnoses   Final diagnoses:  Acute gout due to renal impairment involving toe of right foot     Discharge Instructions      We gave you an injection of steroid in the clinic to help with your gout pain.  Start taking prednisone tomorrow 40 mg once daily with breakfast for the next 5 days.  Take this medicine with food to avoid stomach upset.  You may take tylenol while taking this medicine, but you may not take ibuprofen.   Follow-up with your primary care provider as scheduled on September 27.  Review the information provided in your handout regarding the low purine eating plan.  If you develop any new or worsening symptoms or do not improve in the next 2 to 3 days, please return.  If your symptoms are severe, please go to the emergency room.  Follow-up with your primary care provider for further evaluation and management of your symptoms as well as ongoing wellness visits.  I hope you feel better!     ED Prescriptions     Medication Sig Dispense Auth. Provider   predniSONE (DELTASONE) 20 MG tablet Take 2 tablets (40 mg total) by mouth daily for 5 days.  10 tablet Talbot Grumbling, FNP      PDMP not reviewed this encounter.   Talbot Grumbling, [place] 05/07/22 1319

## 2022-05-07 NOTE — ED Triage Notes (Signed)
Patient having gout flare up in the right foot onset of last night. In the right pinky toe. Patient was diagnosed with gout in the past. Was in the hospital recently for gout in the left foot.   Patient does not have any OTC or prescription medication for gout.

## 2022-05-07 NOTE — Discharge Instructions (Addendum)
We gave you an injection of steroid in the clinic to help with your gout pain.  Start taking prednisone tomorrow 40 mg once daily with breakfast for the next 5 days.  Take this medicine with food to avoid stomach upset.  You may take tylenol while taking this medicine, but you may not take ibuprofen.   Follow-up with your primary care provider as scheduled on September 27.  Review the information provided in your handout regarding the low purine eating plan.  If you develop any new or worsening symptoms or do not improve in the next 2 to 3 days, please return.  If your symptoms are severe, please go to the emergency room.  Follow-up with your primary care provider for further evaluation and management of your symptoms as well as ongoing wellness visits.  I hope you feel better!

## 2022-05-19 ENCOUNTER — Ambulatory Visit (INDEPENDENT_AMBULATORY_CARE_PROVIDER_SITE_OTHER): Payer: Medicare Other | Admitting: Internal Medicine

## 2022-05-19 ENCOUNTER — Encounter: Payer: Self-pay | Admitting: Internal Medicine

## 2022-05-19 DIAGNOSIS — M1000 Idiopathic gout, unspecified site: Secondary | ICD-10-CM | POA: Diagnosis not present

## 2022-05-19 MED ORDER — COLCHICINE 0.6 MG PO TABS: ORAL_TABLET | ORAL | 1 refills | Status: DC

## 2022-05-19 MED ORDER — METHYLPREDNISOLONE 4 MG PO TBPK: ORAL_TABLET | ORAL | 0 refills | Status: DC

## 2022-05-19 NOTE — Progress Notes (Signed)
Subjective:  Patient ID: Jasmin [name], female    DOB: Mar 07, 1934  Age: 88 y.o. MRN: [id number]  CC: Gout (In her feet)   HPI Tiffane L Duzan presents for gout in B toes, pain x 1  week  Outpatient Medications Prior to Visit  Medication Sig Dispense Refill   acetaminophen (TYLENOL) 500 MG tablet Take 1 tablet (500 mg total) by mouth every 6 (six) hours as needed. (Patient taking differently: Take 1,000 mg by mouth every 6 (six) hours as needed for moderate pain or headache.) 30 tablet 0   famotidine (PEPCID) 40 MG tablet TAKE 1 TABLET BY MOUTH  DAILY 90 tablet 3   hydrocortisone (ANUSOL-HC) 2.5 % rectal cream Place 1 application rectally at bedtime as needed for hemorrhoids or anal itching. 28 g 1   lidocaine (LIDODERM) 5 % Place 1 patch onto the skin daily. Remove & Discard patch within 12 hours or as directed by MD 5 patch 0   lovastatin (MEVACOR) 40 MG tablet TAKE 1 TABLET BY MOUTH AT  BEDTIME 90 tablet 3   metoprolol tartrate (LOPRESSOR) 50 MG tablet TAKE 1 TABLET BY MOUTH TWICE  DAILY 180 tablet 3   polyethylene glycol powder (GLYCOLAX/MIRALAX) powder Take 17 g by mouth 2 (two) times daily as needed for mild constipation, moderate constipation or severe constipation. (Patient taking differently: Take 17 g by mouth as needed for mild constipation, moderate constipation or severe constipation.) 500 g 3   spironolactone (ALDACTONE) 50 MG tablet TAKE 1 TABLET BY MOUTH  DAILY 90 tablet 3   cephALEXin (KEFLEX) 500 MG capsule Take 1 capsule (500 mg total) by mouth 3 (three) times daily. (Patient not taking: Reported on 05/19/2022) 15 capsule 0   Cyanocobalamin (VITAMIN B-12 PO) Take 1,000 mcg by mouth daily. (Patient not taking: Reported on 05/19/2022)     gabapentin (NEURONTIN) 100 MG capsule Take 1-2 capsules (100-200 mg total) by mouth at bedtime. (Patient not taking: Reported on 05/19/2022) 60 capsule 3   No facility-administered medications prior to visit.    ROS: Review of  Systems  Constitutional:  Negative for activity change, appetite change, chills, fatigue and unexpected weight change.  HENT:  Negative for congestion, mouth sores and sinus pressure.   Eyes:  Negative for visual disturbance.  Respiratory:  Negative for cough and chest tightness.   Gastrointestinal:  Negative for abdominal pain and nausea.  Genitourinary:  Negative for difficulty urinating, frequency and vaginal pain.  Musculoskeletal:  Positive for arthralgias and gait problem. Negative for back pain.  Skin:  Negative for pallor and rash.  Neurological:  Negative for dizziness, tremors, weakness, numbness and headaches.  Psychiatric/Behavioral:  Negative for confusion and sleep disturbance.     Objective:  BP (!) 142/68 (BP Location: Left Arm)   Pulse 64   Temp 98.2 F (36.8 C) (Oral)   Ht '5\' 6"'$  (1.676 m)   Wt 216 lb (98 kg)   SpO2 96%   BMI 34.86 kg/m   BP Readings from Last 3 Encounters:  05/19/22 (!) 142/68  05/07/22 (!) 146/54  04/14/22 (!) 141/68    Wt Readings from Last 3 Encounters:  05/19/22 216 lb (98 kg)  04/14/22 220 lb 14.4 oz (100.2 kg)  04/12/22 221 lb (100.2 kg)    Physical Exam Constitutional:      General: She is not in acute distress.    Appearance: She is well-developed. She is obese.  HENT:     Head: Normocephalic.     Right  Ear: External ear normal.     Left Ear: External ear normal.     Nose: Nose normal.  Eyes:     General:        Right eye: No discharge.        Left eye: No discharge.     Conjunctiva/sclera: Conjunctivae normal.     Pupils: Pupils are equal, round, and reactive to light.  Neck:     Thyroid: No thyromegaly.     Vascular: No JVD.     Trachea: No tracheal deviation.  Cardiovascular:     Rate and Rhythm: Normal rate and regular rhythm.     Heart sounds: Normal heart sounds.  Pulmonary:     Effort: No respiratory distress.     Breath sounds: No stridor. No wheezing.  Abdominal:     General: Bowel sounds are normal.  There is no distension.     Palpations: Abdomen is soft. There is no mass.     Tenderness: There is no abdominal tenderness. There is no guarding or rebound.  Musculoskeletal:        General: Tenderness present.     Cervical back: Normal range of motion and neck supple. No rigidity.  Lymphadenopathy:     Cervical: No cervical adenopathy.  Skin:    Findings: No erythema or rash.  Neurological:     Mental Status: She is oriented to person, place, and time.     Cranial Nerves: No cranial nerve deficit.     Motor: No abnormal muscle tone.     Coordination: Coordination normal.     Gait: Gait abnormal.     Deep Tendon Reflexes: Reflexes normal.  Psychiatric:        Behavior: Behavior normal.        Thought Content: Thought content normal.        Judgment: Judgment normal.   B 5th toes are sensitive  Lab Results  Component Value Date   WBC 3.3 (L) 04/14/2022   HGB 14.1 04/14/2022   HCT 42.5 04/14/2022   PLT 79 (L) 04/14/2022   GLUCOSE 100 (H) 04/14/2022   CHOL 148 04/14/2022   TRIG 73 04/14/2022   HDL 60 04/14/2022   LDLDIRECT 139.3 05/31/2012   LDLCALC 73 04/14/2022   ALT 19 04/14/2022   AST 33 04/14/2022   NA 139 04/14/2022   K 4.0 04/14/2022   CL 107 04/14/2022   CREATININE 1.46 (H) 04/14/2022   BUN 20 04/14/2022   CO2 22 04/14/2022   TSH 1.56 07/29/2020   INR 1.1 04/14/2022   HGBA1C 5.5 04/14/2022    No results found.  Assessment & Plan:   Problem List Items Addressed This Visit     Gout    Colchicine prn Medrol pack prn Allopurinol intolerant         Meds ordered this encounter  Medications   methylPREDNISolone (MEDROL DOSEPAK) 4 MG TBPK tablet    Sig: As directed for gout    Dispense:  21 tablet    Refill:  0   colchicine 0.6 MG tablet    Sig: Take two tablets prn gout attack. Then take another one in 1-2 hrs. Do not repeat for 3 days.    Dispense:  18 tablet    Refill:  1      Follow-up: No follow-ups on file.  Walker Kehr, MD

## 2022-05-19 NOTE — Assessment & Plan Note (Addendum)
Colchicine prn Medrol pack prn Allopurinol intolerant

## 2022-05-24 ENCOUNTER — Ambulatory Visit: Payer: Medicare Other | Admitting: Internal Medicine

## 2022-06-23 DIAGNOSIS — R350 Frequency of micturition: Secondary | ICD-10-CM | POA: Diagnosis not present

## 2022-07-05 ENCOUNTER — Ambulatory Visit (INDEPENDENT_AMBULATORY_CARE_PROVIDER_SITE_OTHER): Payer: Medicare Other

## 2022-07-05 VITALS — Ht 66.0 in

## 2022-07-05 DIAGNOSIS — Z0000 Encounter for general adult medical examination without abnormal findings: Secondary | ICD-10-CM | POA: Diagnosis not present

## 2022-07-05 NOTE — Progress Notes (Addendum)
Virtual Visit via Telephone Note  I connected with  Jasmin [name] on 07/05/22 at 11:15 AM EST by telephone and verified that I am speaking with the correct person using two identifiers.  Location: Patient: Home Provider: [place] Persons participating in the virtual visit: [place]   I discussed the limitations, risks, security and privacy concerns of performing an evaluation and management service by telephone and the availability of in person appointments. The patient expressed understanding and agreed to proceed.  Interactive audio and video telecommunications were attempted between this nurse and patient, however failed, due to patient having technical difficulties OR patient did not have access to video capability.  We continued and completed visit with audio only.  Some vital signs may be absent or patient reported.   Sheral Flow, LPN  Subjective:   Jasmin [name] is a 88 y.o. female who presents for Medicare Annual (Subsequent) preventive examination.  Review of Systems     Cardiac Risk Factors include: advanced age (>73mn, >>68women);hypertension;family history of premature cardiovascular disease;dyslipidemia;obesity (BMI >30kg/m2)     Objective:    Today's Vitals   07/05/22 1129  Height: '5\' 6"'$  (1.676 m)  PainSc: 0-No pain   Body mass index is 34.86 kg/m.     07/05/2022   11:35 AM 04/14/2022    4:59 AM 04/12/2022    1:36 AM 02/14/2022    8:32 AM 07/17/2021    2:38 AM 07/03/2021   11:33 AM 03/13/2020   12:10 PM  Advanced Directives  Does Patient Have a Medical Advance Directive? Yes No No No Yes Yes Yes  Type of Advance Directive Living will    [place]  Does patient want to make changes to medical advance directive? No - Patient declined   Yes (ED - Information included in AVS)   No - Patient declined  Copy of [place]?      No - copy requested No - copy requested  Would patient like information on creating a medical advance directive?   No - Patient declined Yes (ED - Information included in AVS)       Current Medications (verified) Outpatient Encounter Medications as of 07/05/2022  Medication Sig   acetaminophen (TYLENOL) 500 MG tablet Take 1 tablet (500 mg total) by mouth every 6 (six) hours as needed. (Patient taking differently: Take 1,000 mg by mouth every 6 (six) hours as needed for moderate pain or headache.)   colchicine 0.6 MG tablet Take two tablets prn gout attack. Then take another one in 1-2 hrs. Do not repeat for 3 days.   famotidine (PEPCID) 40 MG tablet TAKE 1 TABLET BY MOUTH  DAILY   hydrocortisone (ANUSOL-HC) 2.5 % rectal cream Place 1 application rectally at bedtime as needed for hemorrhoids or anal itching.   lidocaine (LIDODERM) 5 % Place 1 patch onto the skin daily. Remove & Discard patch within 12 hours or as directed by MD   lovastatin (MEVACOR) 40 MG tablet TAKE 1 TABLET BY MOUTH AT  BEDTIME   methylPREDNISolone (MEDROL DOSEPAK) 4 MG TBPK tablet As directed for gout   metoprolol tartrate (LOPRESSOR) 50 MG tablet TAKE 1 TABLET BY MOUTH TWICE  DAILY   polyethylene glycol powder (GLYCOLAX/MIRALAX) powder Take 17 g by mouth 2 (two) times daily as needed for mild constipation, moderate constipation or severe constipation. (Patient taking differently: Take 17 g by mouth as  needed for mild constipation, moderate constipation or severe constipation.)   spironolactone (ALDACTONE) 50 MG tablet TAKE 1 TABLET BY MOUTH  DAILY   No facility-administered encounter medications on file as of 07/05/2022.    Allergies (verified) Allopurinol, Doxycycline, Hctz [hydrochlorothiazide], Omeprazole, Oxybutynin, Sulfonamide derivatives, Clarithromycin, and Penicillins   History: Past Medical History:  Diagnosis Date   Contact dermatitis and other eczema, due to unspecified  cause    Diverticulosis of colon    Esophageal stricture    GERD with stricture    Dr Henrene Pastor   Gout    Hemorrhoids    Hiatal hernia    Hyperlipidemia    Hypertension    Onychomycosis    Personal history of urinary calculi    Polycystic kidney    Tubulovillous adenoma polyp of colon    Unspecified vitamin D deficiency    Vitamin B12 deficiency 2009   Borderline   Past Surgical History:  Procedure Laterality Date   CATARACT EXTRACTION Bilateral 08/24/2015   COLONOSCOPY     DILATION AND CURETTAGE OF UTERUS  08/24/1975   TOE SURGERY  08/23/2010   Right foot    TONSILLECTOMY     as child   Family History  Problem Relation Age of Onset   Hypertension Father    Colon cancer Neg Hx    Esophageal cancer Neg Hx    Rectal cancer Neg Hx    Stomach cancer Neg Hx    Social History   Socioeconomic History   Marital status: Married    Spouse name: Not on file   Number of children: 2   Years of education: Not on file   Highest education level: Not on file  Occupational History   Occupation: RETIRED  Tobacco Use   Smoking status: Never   Smokeless tobacco: Never  Vaping Use   Vaping Use: Never used  Substance and Sexual Activity   Alcohol use: No    Alcohol/week: 0.0 standard drinks of alcohol   Drug use: No   Sexual activity: Not Currently  Other Topics Concern   Not on file  Social History Narrative   GYN: Dr Philis Pique      FAMILY HISTORY HYPERTENSION      Regular exercise - NO   Social Determinants of Health   Financial Resource Strain: Low Risk  (07/05/2022)   Overall Financial Resource Strain (CARDIA)    Difficulty of Paying Living Expenses: Not hard at all  Food Insecurity: No Food Insecurity (07/05/2022)   Hunger Vital Sign    Worried About Estate manager/land agent of Food in the Last Year: Never true    Ran Out of Food in the Last Year: Never true  Transportation Needs: No Transportation Needs (07/05/2022)   PRAPARE - Hydrologist  (Medical): No    Lack of Transportation (Non-Medical): No  Physical Activity: Sufficiently Active (07/05/2022)   Exercise Vital Sign    Days of Exercise per Week: 3 days    Minutes of Exercise per Session: 60 min  Stress: No Stress Concern Present (07/05/2022)   [place]    Feeling of Stress : Not at all  Social Connections: [place] (07/05/2022)   Social Connection and Isolation Panel [NHANES]    Frequency of Communication with Friends and Family: Twice a week    Frequency of Social Gatherings with Friends and Family: Twice a week    Attends Religious Services: More than 4 times per year  Active Member of Clubs or Organizations: Yes    Attends Archivist Meetings: More than 4 times per year    Marital Status: Married    Tobacco Counseling Counseling given: Not Answered   Clinical Intake:  Pre-visit preparation completed: Yes  Pain : No/denies pain Pain Score: 0-No pain     BMI - recorded: 34.88 (05/19/2022) Nutritional Status: BMI > 30  Obese Nutritional Risks: None Diabetes: No  How often do you need to have someone help you when you read instructions, pamphlets, or other written materials from your doctor or pharmacy?: 1 - Never What is the last grade level you completed in school?: HSG  Diabetic? no  Interpreter Needed?: No  Information entered by :: Lisette Abu, LPN.   Activities of Daily Living    07/05/2022   11:36 AM  In your present state of health, do you have any difficulty performing the following activities:  Hearing? 0  Vision? 0  Difficulty concentrating or making decisions? 1  Walking or climbing stairs? 0  Dressing or bathing? 0  Doing errands, shopping? 0  Preparing Food and eating ? N  Using the Toilet? N  In the past six months, have you accidently leaked urine? Y  Do you have problems with loss of bowel control? N  Managing your Medications?  N  Managing your Finances? N  Housekeeping or managing your Housekeeping? N    Patient Care Team: Plotnikov, Evie Lacks, MD as PCP - Gwenevere Abbot, Docia Chuck, MD (Gastroenterology) Janyth Pupa, DO as Consulting Physician (Obstetrics and Gynecology) Leandrew Koyanagi, MD as Attending Physician (Orthopedic Surgery) [facility]  Indicate any recent Medical Services you may have received from other than [facility] providers in the past year (date may be approximate).     Assessment:   This is a routine wellness examination for Jasmin [name].  Hearing/Vision screen Hearing Screening - Comments:: Denies hearing difficulties   Vision Screening - Comments:: Wears rx glasses - up to date with routine eye exams with [facility]   Dietary issues and exercise activities discussed: Current Exercise Habits: Home exercise routine, Type of exercise: walking;Other - see comments (Silver Sneakers Program), Time (Minutes): 30, Frequency (Times/Week): 5, Weekly Exercise (Minutes/Week): 150, Intensity: Moderate, Exercise limited by: orthopedic condition(s)   Goals Addressed   None   Depression Screen    07/05/2022   11:33 AM 07/03/2021   11:35 AM 07/03/2021   11:29 AM 03/13/2020   12:11 PM 06/07/2019    8:29 AM 03/08/2019    8:47 AM 03/02/2018    8:54 AM  PHQ 2/9 Scores  PHQ - 2 Score 0 0 0 0 0 0 0    Fall Risk    07/05/2022   11:36 AM 07/03/2021   11:35 AM 03/13/2020   12:11 PM 06/07/2019    8:29 AM 03/08/2019    8:47 AM  Fall Risk   Falls in the past year? 0 0 0 0 0  Number falls in past yr: 0 0 0  0  Injury with Fall? 0 0 0    Risk for fall due to : No Fall Risks  No Fall Risks  Impaired mobility;Impaired balance/gait  Follow up Falls prevention discussed Falls evaluation completed Falls evaluation completed;Education provided Falls evaluation completed Falls prevention discussed    FALL RISK PREVENTION PERTAINING TO THE HOME:  Any stairs in or around the home? Yes  If so,  are there any without handrails? No  Home free of  loose throw rugs in walkways, pet beds, electrical cords, etc? Yes  Adequate lighting in your home to reduce risk of falls? Yes   ASSISTIVE DEVICES UTILIZED TO PREVENT FALLS:  Life alert? No  Use of a cane, walker or w/c? No  Grab bars in the bathroom? No  Shower chair or bench in shower? No  Elevated toilet seat or a handicapped toilet? No  (In process of remodeling bathroom)  TIMED UP AND GO:  Was the test performed? No . Phone Visit  Cognitive Function:    03/02/2018    9:03 AM  MMSE - Mini Mental State Exam  Orientation to time 5  Orientation to Place 5  Registration 3  Attention/ Calculation 3  Recall 3  Language- name 2 objects 2  Language- repeat 1  Language- follow 3 step command 3  Language- read & follow direction 1  Write a sentence 1  Copy design 1  Total score 28        07/05/2022   11:38 AM 03/13/2020   12:12 PM  6CIT Screen  What Year? 0 points 0 points  What month? 0 points 0 points  What time? 0 points 0 points  Count back from 20 0 points 0 points  Months in reverse 0 points 0 points  Repeat phrase 0 points 0 points  Total Score 0 points 0 points    Immunizations Immunization History  Administered Date(s) Administered   Fluad Quad(high Dose 65+) 04/11/2019, 07/29/2020, 05/04/2022   Influenza Split 05/20/2011, 05/31/2012   Influenza Whole 08/24/2005, 06/21/2008, 06/10/2009   Influenza, High Dose Seasonal PF 06/15/2017, 06/02/2018, 06/05/2021   Influenza,inj,Quad PF,6+ Mos 05/22/2013, 05/29/2014, 06/04/2015, 04/16/2016   PFIZER(Purple Top)SARS-COV-2 Vaccination 09/02/2019, 10/13/2019, 05/19/2020   Pneumococcal Conjugate-13 09/21/2013   Pneumococcal Polysaccharide-23 08/25/2004, 11/12/2009, 05/31/2012   Td 03/02/2013   Zoster Recombinat (Shingrix) 03/02/2017, 06/13/2017    TDAP status: Up to date  Flu Vaccine status: Up to date  Pneumococcal vaccine status: Up to date  Covid-19  vaccine status: Completed vaccines  Qualifies for Shingles Vaccine? Yes   Zostavax completed No   Shingrix Completed?: Yes  Screening Tests Health Maintenance  Topic Date Due   COLONOSCOPY (Pts 45-28yr Insurance coverage will need to be confirmed)  03/14/2020   COVID-19 Vaccine (4 - Pfizer series) 07/14/2020   TETANUS/TDAP  03/03/2023   Medicare Annual Wellness (AWV)  07/06/2023   Pneumonia Vaccine 88 Years old  Completed   INFLUENZA VACCINE  Completed   DEXA SCAN  Completed   Zoster Vaccines- Shingrix  Completed   HPV VACCINES  Aged Out    Health Maintenance  Health Maintenance Due  Topic Date Due   COLONOSCOPY (Pts 45-48yrInsurance coverage will need to be confirmed)  03/14/2020   COVID-19 Vaccine (4 - Pfizer series) 07/14/2020    Colorectal cancer screening: No longer required.   Mammogram status: No longer required due to age.  Bone Density status: Completed 12/21/2016. Results reflect: Bone density results: OSTEOPENIA. Repeat every 2-3 years.  Lung Cancer Screening: (Low Dose CT Chest recommended if Age 319-80ears, 30 pack-year currently smoking OR have quit w/in 15years.) does not qualify.   Lung Cancer Screening Referral: no  Additional Screening:  Hepatitis C Screening: does not qualify; Completed no  Vision Screening: Recommended annual ophthalmology exams for early detection of glaucoma and other disorders of the eye. Is the patient up to date with their annual eye exam?  Yes  Who is the provider or what is the name of  the office in which the patient attends annual eye exams? [facility] If pt is not established with a provider, would they like to be referred to a provider to establish care? No .   Dental Screening: Recommended annual dental exams for proper oral hygiene  Community Resource Referral / Chronic Care Management: CRR required this visit?  No   CCM required this visit?  No      Plan:     I have personally reviewed and noted the  following in the patient's chart:   Medical and social history Use of alcohol, tobacco or illicit drugs  Current medications and supplements including opioid prescriptions. Patient is not currently taking opioid prescriptions. Functional ability and status Nutritional status Physical activity Advanced directives List of other physicians Hospitalizations, surgeries, and ER visits in previous 12 months Vitals Screenings to include cognitive, depression, and falls Referrals and appointments  In addition, I have reviewed and discussed with patient certain preventive protocols, quality metrics, and best practice recommendations. A written personalized care plan for preventive services as well as general preventive health recommendations were provided to patient.     Sheral Flow, LPN   01/75/1025   Nurse Notes: N/A     Medical screening examination/treatment/procedure(s) were performed by non-physician practitioner and as supervising physician I was immediately available for consultation/collaboration.  I agree with above. Lew Dawes, MD

## 2022-07-05 NOTE — Patient Instructions (Signed)
[name] , Thank you for taking time to come for your Medicare Wellness Visit. I appreciate your ongoing commitment to your health goals. Please review the following plan we discussed and let me know if I can assist you in the future.   These are the goals we discussed:  Goals      Exercise 150 minutes per week (moderate activity)     Continue with silver sneakers; and may consider zumba or water areobics Try to build up to 5 days a week to help with weight loss Walking counts!!         This is a list of the screening recommended for you and due dates:  Health Maintenance  Topic Date Due   COVID-19 Vaccine (4 - Pfizer series) 07/14/2020   Tetanus Vaccine  03/03/2023   Medicare Annual Wellness Visit  07/06/2023   Pneumonia Vaccine  Completed   Flu Shot  Completed   DEXA scan (bone density measurement)  Completed   Zoster (Shingles) Vaccine  Completed   HPV Vaccine  Aged Out   Colon Cancer Screening  Discontinued    Advanced directives: Yes; Patient has a Living Will  Conditions/risks identified: Yes  Next appointment: Follow up in one year for your annual wellness visit.   Preventive Care 67 Years and Older, Female Preventive care refers to lifestyle choices and visits with your health care provider that can promote health and wellness. What does preventive care include? A yearly physical exam. This is also called an annual well check. Dental exams once or twice a year. Routine eye exams. Ask your health care provider how often you should have your eyes checked. Personal lifestyle choices, including: Daily care of your teeth and gums. Regular physical activity. Eating a healthy diet. Avoiding tobacco and drug use. Limiting alcohol use. Practicing safe sex. Taking low-dose aspirin every day. Taking vitamin and mineral supplements as recommended by your health care provider. What happens during an annual well check? The services and screenings done by your health  care provider during your annual well check will depend on your age, overall health, lifestyle risk factors, and family history of disease. Counseling  Your health care provider may ask you questions about your: Alcohol use. Tobacco use. Drug use. Emotional well-being. Home and relationship well-being. Sexual activity. Eating habits. History of falls. Memory and ability to understand (cognition). Work and work Statistician. Reproductive health. Screening  You may have the following tests or measurements: Height, weight, and BMI. Blood pressure. Lipid and cholesterol levels. These may be checked every 5 years, or more frequently if you are over 71 years old. Skin check. Lung cancer screening. You may have this screening every year starting at age 68 if you have a 30-pack-year history of smoking and currently smoke or have quit within the past 15 years. Fecal occult blood test (FOBT) of the stool. You may have this test every year starting at age 75. Flexible sigmoidoscopy or colonoscopy. You may have a sigmoidoscopy every 5 years or a colonoscopy every 10 years starting at age 46. Hepatitis C blood test. Hepatitis B blood test. Sexually transmitted disease (STD) testing. Diabetes screening. This is done by checking your blood sugar (glucose) after you have not eaten for a while (fasting). You may have this done every 1-3 years. Bone density scan. This is done to screen for osteoporosis. You may have this done starting at age 37. Mammogram. This may be done every 1-2 years. Talk to your health care provider about  how often you should have regular mammograms. Talk with your health care provider about your test results, treatment options, and if necessary, the need for more tests. Vaccines  Your health care provider may recommend certain vaccines, such as: Influenza vaccine. This is recommended every year. Tetanus, diphtheria, and acellular pertussis (Tdap, Td) vaccine. You may need a Td  booster every 10 years. Zoster vaccine. You may need this after age 98. Pneumococcal 13-valent conjugate (PCV13) vaccine. One dose is recommended after age 72. Pneumococcal polysaccharide (PPSV23) vaccine. One dose is recommended after age 59. Talk to your health care provider about which screenings and vaccines you need and how often you need them. This information is not intended to replace advice given to you by your health care provider. Make sure you discuss any questions you have with your health care provider. Document Released: 09/05/2015 Document Revised: 04/28/2016 Document Reviewed: 06/10/2015 Elsevier Interactive Patient Education  2017 [place] Prevention in the Home Falls can cause injuries. They can happen to people of all ages. There are many things you can do to make your home safe and to help prevent falls. What can I do on the outside of my home? Regularly fix the edges of walkways and driveways and fix any cracks. Remove anything that might make you trip as you walk through a door, such as a raised step or threshold. Trim any bushes or trees on the path to your home. Use bright outdoor lighting. Clear any walking paths of anything that might make someone trip, such as rocks or tools. Regularly check to see if handrails are loose or broken. Make sure that both sides of any steps have handrails. Any raised decks and porches should have guardrails on the edges. Have any leaves, snow, or ice cleared regularly. Use sand or salt on walking paths during winter. Clean up any spills in your garage right away. This includes oil or grease spills. What can I do in the bathroom? Use night lights. Install grab bars by the toilet and in the tub and shower. Do not use towel bars as grab bars. Use non-skid mats or decals in the tub or shower. If you need to sit down in the shower, use a plastic, non-slip stool. Keep the floor dry. Clean up any water that spills on the floor  as soon as it happens. Remove soap buildup in the tub or shower regularly. Attach bath mats securely with double-sided non-slip rug tape. Do not have throw rugs and other things on the floor that can make you trip. What can I do in the bedroom? Use night lights. Make sure that you have a light by your bed that is easy to reach. Do not use any sheets or blankets that are too big for your bed. They should not hang down onto the floor. Have a firm chair that has side arms. You can use this for support while you get dressed. Do not have throw rugs and other things on the floor that can make you trip. What can I do in the kitchen? Clean up any spills right away. Avoid walking on wet floors. Keep items that you use a lot in easy-to-reach places. If you need to reach something above you, use a strong step stool that has a grab bar. Keep electrical cords out of the way. Do not use floor polish or wax that makes floors slippery. If you must use wax, use non-skid floor wax. Do not have throw rugs and other  things on the floor that can make you trip. What can I do with my stairs? Do not leave any items on the stairs. Make sure that there are handrails on both sides of the stairs and use them. Fix handrails that are broken or loose. Make sure that handrails are as long as the stairways. Check any carpeting to make sure that it is firmly attached to the stairs. Fix any carpet that is loose or worn. Avoid having throw rugs at the top or bottom of the stairs. If you do have throw rugs, attach them to the floor with carpet tape. Make sure that you have a light switch at the top of the stairs and the bottom of the stairs. If you do not have them, ask someone to add them for you. What else can I do to help prevent falls? Wear shoes that: Do not have high heels. Have rubber bottoms. Are comfortable and fit you well. Are closed at the toe. Do not wear sandals. If you use a stepladder: Make sure that it is  fully opened. Do not climb a closed stepladder. Make sure that both sides of the stepladder are locked into place. Ask someone to hold it for you, if possible. Clearly mark and make sure that you can see: Any grab bars or handrails. First and last steps. Where the edge of each step is. Use tools that help you move around (mobility aids) if they are needed. These include: Canes. Walkers. Scooters. Crutches. Turn on the lights when you go into a dark area. Replace any light bulbs as soon as they burn out. Set up your furniture so you have a clear path. Avoid moving your furniture around. If any of your floors are uneven, fix them. If there are any pets around you, be aware of where they are. Review your medicines with your doctor. Some medicines can make you feel dizzy. This can increase your chance of falling. Ask your doctor what other things that you can do to help prevent falls. This information is not intended to replace advice given to you by your health care provider. Make sure you discuss any questions you have with your health care provider. Document Released: 06/05/2009 Document Revised: 01/15/2016 Document Reviewed: 09/13/2014 Elsevier Interactive Patient Education  2017 [employer].

## 2022-08-03 DIAGNOSIS — Z87440 Personal history of urinary (tract) infections: Secondary | ICD-10-CM | POA: Diagnosis not present

## 2022-08-03 DIAGNOSIS — R350 Frequency of micturition: Secondary | ICD-10-CM | POA: Diagnosis not present

## 2022-08-03 DIAGNOSIS — Z87442 Personal history of urinary calculi: Secondary | ICD-10-CM | POA: Diagnosis not present

## 2022-08-18 ENCOUNTER — Ambulatory Visit: Payer: Medicare Other | Admitting: Internal Medicine

## 2022-08-20 ENCOUNTER — Other Ambulatory Visit: Payer: Self-pay | Admitting: Internal Medicine

## 2022-08-26 ENCOUNTER — Encounter: Payer: Self-pay | Admitting: Internal Medicine

## 2022-08-26 ENCOUNTER — Ambulatory Visit (INDEPENDENT_AMBULATORY_CARE_PROVIDER_SITE_OTHER): Payer: Medicare Other | Admitting: Internal Medicine

## 2022-08-26 VITALS — BP 120/76 | HR 55 | Temp 98.2°F | Ht 66.0 in | Wt 217.0 lb

## 2022-08-26 DIAGNOSIS — E559 Vitamin D deficiency, unspecified: Secondary | ICD-10-CM

## 2022-08-26 DIAGNOSIS — Z23 Encounter for immunization: Secondary | ICD-10-CM

## 2022-08-26 DIAGNOSIS — M545 Low back pain, unspecified: Secondary | ICD-10-CM

## 2022-08-26 DIAGNOSIS — E538 Deficiency of other specified B group vitamins: Secondary | ICD-10-CM

## 2022-08-26 DIAGNOSIS — R739 Hyperglycemia, unspecified: Secondary | ICD-10-CM | POA: Diagnosis not present

## 2022-08-26 DIAGNOSIS — G8929 Other chronic pain: Secondary | ICD-10-CM

## 2022-08-26 DIAGNOSIS — R413 Other amnesia: Secondary | ICD-10-CM | POA: Diagnosis not present

## 2022-08-26 DIAGNOSIS — I700 Atherosclerosis of aorta: Secondary | ICD-10-CM

## 2022-08-26 DIAGNOSIS — I10 Essential (primary) hypertension: Secondary | ICD-10-CM

## 2022-08-26 LAB — VITAMIN B12: Vitamin B-12: >1500 pg/mL — ABNORMAL HIGH (ref 211–911)

## 2022-08-26 LAB — COMPREHENSIVE METABOLIC PANEL
ALT: 12 U/L (ref 0–35)
AST: 17 U/L (ref 0–37)
Albumin: 4.4 g/dL (ref 3.5–5.2)
Alkaline Phosphatase: 52 U/L (ref 39–117)
BUN: 22 mg/dL (ref 6–23)
CO2: 29 mEq/L (ref 19–32)
Calcium: 10.1 mg/dL (ref 8.4–10.5)
Chloride: 104 mEq/L (ref 96–112)
Creatinine, Ser: 1.2 mg/dL (ref 0.40–1.20)
GFR: 40.93 mL/min — ABNORMAL LOW (ref >60.00)
Glucose, Bld: 91 mg/dL (ref 70–99)
Potassium: 4.6 mEq/L (ref 3.5–5.1)
Sodium: 140 mEq/L (ref 135–145)
Total Bilirubin: 0.5 mg/dL (ref 0.2–1.2)
Total Protein: 7.3 g/dL (ref 6.0–8.3)

## 2022-08-26 LAB — VITAMIN D 25 HYDROXY (VIT D DEFICIENCY, FRACTURES): VITD: 49.74 ng/mL (ref 30.00–100.00)

## 2022-08-26 LAB — HEMOGLOBIN A1C: Hgb A1c MFr Bld: 6 % (ref 4.6–6.5)

## 2022-08-26 LAB — TSH: TSH: 1.58 u[IU]/mL (ref 0.35–5.50)

## 2022-08-26 NOTE — Assessment & Plan Note (Signed)
On B complex Check B12

## 2022-08-26 NOTE — Assessment & Plan Note (Signed)
  On diet  

## 2022-08-26 NOTE — Progress Notes (Signed)
Subjective:  Patient ID: Jasmin [name], female    DOB: 1933-10-23  Age: 89 y.o. MRN: [id number]  CC: Follow-up   HPI Jennfer Gassen Amara presents for HTN, gout, constipation  Outpatient Medications Prior to Visit  Medication Sig Dispense Refill   acetaminophen (TYLENOL) 500 MG tablet Take 1 tablet (500 mg total) by mouth every 6 (six) hours as needed. (Patient taking differently: Take 1,000 mg by mouth every 6 (six) hours as needed for moderate pain or headache.) 30 tablet 0   colchicine 0.6 MG tablet Take two tablets prn gout attack. Then take another one in 1-2 hrs. Do not repeat for 3 days. 18 tablet 1   famotidine (PEPCID) 40 MG tablet TAKE 1 TABLET BY MOUTH DAILY 100 tablet 2   hydrocortisone (ANUSOL-HC) 2.5 % rectal cream Place 1 application rectally at bedtime as needed for hemorrhoids or anal itching. 28 g 1   lidocaine (LIDODERM) 5 % Place 1 patch onto the skin daily. Remove & Discard patch within 12 hours or as directed by MD 5 patch 0   lovastatin (MEVACOR) 40 MG tablet TAKE 1 TABLET BY MOUTH AT  BEDTIME 90 tablet 3   methylPREDNISolone (MEDROL DOSEPAK) 4 MG TBPK tablet As directed for gout 21 tablet 0   metoprolol tartrate (LOPRESSOR) 50 MG tablet TAKE 1 TABLET BY MOUTH TWICE  DAILY 180 tablet 3   polyethylene glycol powder (GLYCOLAX/MIRALAX) powder Take 17 g by mouth 2 (two) times daily as needed for mild constipation, moderate constipation or severe constipation. (Patient taking differently: Take 17 g by mouth as needed for mild constipation, moderate constipation or severe constipation.) 500 g 3   spironolactone (ALDACTONE) 50 MG tablet TAKE 1 TABLET BY MOUTH DAILY 100 tablet 2   No facility-administered medications prior to visit.    ROS: Review of Systems  Constitutional:  Negative for activity change, appetite change, chills, fatigue and unexpected weight change.  HENT:  Negative for congestion, mouth sores and sinus pressure.   Eyes:  Negative for visual  disturbance.  Respiratory:  Negative for cough and chest tightness.   Gastrointestinal:  Negative for abdominal pain and nausea.  Genitourinary:  Negative for difficulty urinating, frequency and vaginal pain.  Musculoskeletal:  Negative for back pain and gait problem.  Skin:  Negative for pallor and rash.  Neurological:  Negative for dizziness, tremors, weakness, numbness and headaches.  Psychiatric/Behavioral:  Negative for confusion and sleep disturbance.     Objective:  BP 120/76 (BP Location: Right Arm, Patient Position: Sitting, Cuff Size: Normal)   Pulse (!) 55   Temp 98.2 F (36.8 C) (Oral)   Ht '5\' 6"'$  (1.676 m)   Wt 217 lb (98.4 kg)   SpO2 100%   BMI 35.02 kg/m   BP Readings from Last 3 Encounters:  08/26/22 120/76  05/19/22 (!) 142/68  05/07/22 (!) 146/54    Wt Readings from Last 3 Encounters:  08/26/22 217 lb (98.4 kg)  05/19/22 216 lb (98 kg)  04/14/22 220 lb 14.4 oz (100.2 kg)    Physical Exam Constitutional:      General: She is not in acute distress.    Appearance: She is well-developed. She is obese.  HENT:     Head: Normocephalic.     Right Ear: External ear normal.     Left Ear: External ear normal.     Nose: Nose normal.  Eyes:     General:        Right eye: No discharge.  Left eye: No discharge.     Conjunctiva/sclera: Conjunctivae normal.     Pupils: Pupils are equal, round, and reactive to light.  Neck:     Thyroid: No thyromegaly.     Vascular: No JVD.     Trachea: No tracheal deviation.  Cardiovascular:     Rate and Rhythm: Normal rate and regular rhythm.     Heart sounds: Normal heart sounds.  Pulmonary:     Effort: No respiratory distress.     Breath sounds: No stridor. No wheezing.  Abdominal:     General: Bowel sounds are normal. There is no distension.     Palpations: Abdomen is soft. There is no mass.     Tenderness: There is no abdominal tenderness. There is no guarding or rebound.  Musculoskeletal:        General: No  tenderness.     Cervical back: Normal range of motion and neck supple. No rigidity.  Lymphadenopathy:     Cervical: No cervical adenopathy.  Skin:    Findings: No erythema or rash.  Neurological:     Mental Status: She is oriented to person, place, and time.     Cranial Nerves: No cranial nerve deficit.     Motor: No abnormal muscle tone.     Coordination: Coordination normal.     Deep Tendon Reflexes: Reflexes normal.  Psychiatric:        Behavior: Behavior normal.        Thought Content: Thought content normal.        Judgment: Judgment normal.     Lab Results  Component Value Date   WBC 3.3 (L) 04/14/2022   HGB 14.1 04/14/2022   HCT 42.5 04/14/2022   PLT 79 (L) 04/14/2022   GLUCOSE 91 08/26/2022   CHOL 148 04/14/2022   TRIG 73 04/14/2022   HDL 60 04/14/2022   LDLDIRECT 139.3 05/31/2012   LDLCALC 73 04/14/2022   ALT 12 08/26/2022   AST 17 08/26/2022   NA 140 08/26/2022   K 4.6 08/26/2022   CL 104 08/26/2022   CREATININE 1.20 08/26/2022   BUN 22 08/26/2022   CO2 29 08/26/2022   TSH 1.58 08/26/2022   INR 1.1 04/14/2022   HGBA1C 6.0 08/26/2022    No results found.  Assessment & Plan:   Problem List Items Addressed This Visit     Vitamin D deficiency    On Vit D      Relevant Orders   VITAMIN D 25 Hydroxy (Vit-D Deficiency, Fractures) (Completed)   Memory problem    Mild. Doing well      Relevant Orders   Hemoglobin A1c (Completed)   TSH (Completed)   Low back pain    Chronic. Cont w/current  Rx - Gabapentin po Blue-Emu cream was recommended to use 2-3 times a day      Relevant Orders   TSH (Completed)   Hyperglycemia    Check A1c      Relevant Orders   Hemoglobin A1c (Completed)   Essential hypertension - Primary     Continue with Metoprolol, Spironolactone      Relevant Orders   Comprehensive metabolic panel (Completed)   TSH (Completed)   B12 deficiency    On B complex Check B12      Relevant Orders   Vitamin B12 (Completed)    Aortic atherosclerosis ([place])    On diet      Relevant Orders   Comprehensive metabolic panel (Completed)   Other Visit Diagnoses  Need for pneumococcal 20-valent conjugate vaccination       Relevant Orders   Pneumococcal conjugate vaccine 20-valent (Prevnar 20) (Completed)         No orders of the defined types were placed in this encounter.     Follow-up: Return in about 6 months (around 02/24/2023) for a follow-up visit.  Walker Kehr, MD

## 2022-08-26 NOTE — Assessment & Plan Note (Signed)
Check A1c. 

## 2022-08-26 NOTE — Assessment & Plan Note (Signed)
On Vit D 

## 2022-08-26 NOTE — Assessment & Plan Note (Signed)
Chronic. Cont w/current  Rx - Gabapentin po Blue-Emu cream was recommended to use 2-3 times a day

## 2022-08-26 NOTE — Assessment & Plan Note (Signed)
Continue with Metoprolol, Spironolactone

## 2022-08-26 NOTE — Assessment & Plan Note (Signed)
Mild. Doing well

## 2022-08-30 ENCOUNTER — Encounter (HOSPITAL_BASED_OUTPATIENT_CLINIC_OR_DEPARTMENT_OTHER): Payer: Self-pay | Admitting: *Deleted

## 2022-08-30 ENCOUNTER — Emergency Department (HOSPITAL_BASED_OUTPATIENT_CLINIC_OR_DEPARTMENT_OTHER)
Admission: EM | Admit: 2022-08-30 | Discharge: 2022-08-30 | Disposition: A | Discharge status: Home / Self Care | Payer: Medicare Other | Attending: Emergency Medicine | Admitting: Emergency Medicine

## 2022-08-30 ENCOUNTER — Other Ambulatory Visit: Payer: Self-pay

## 2022-08-30 DIAGNOSIS — R040 Epistaxis: Secondary | ICD-10-CM | POA: Diagnosis not present

## 2022-08-30 MED ORDER — OXYMETAZOLINE HCL 0.05 % NA SOLN
1.0000 | Freq: Once | NASAL | Status: AC
  Administered 2022-08-30: 1 via NASAL
  Filled 2022-08-30: qty 30

## 2022-08-30 NOTE — ED Triage Notes (Signed)
Pt is here for a nosebleed which began just before 8am.  Pt reports that it is her left side.  Pt has large clot in left nostril and reports that she feels blood running down her throat.  Pt denies any trauma, she is not on any blood thinners.

## 2022-08-30 NOTE — Discharge Instructions (Addendum)
You are seen today for a nosebleed.  It stopped with pressure and Afrin spray.  Use Vaseline or Aquaphor on the inside of your nose or saline spray to keep the area moist.  You can use a humidifier in your bedroom as well.  Follow-up with the ENT doctor.  Come back to the ER if you have any new or concerning symptoms.  If the ENT specialist is not in your insurance network call the number on the back of your insurance card to get  the contact of somebody in network.

## 2022-08-30 NOTE — ED Notes (Signed)
Patient verbalizes understanding of discharge instructions. Opportunity for questioning and answers were provided. Patient discharged from ED.  °

## 2022-08-30 NOTE — ED Provider Notes (Signed)
[facility] EMERGENCY DEPT Provider Note   CSN: [id number] Arrival date & time: 08/30/22  0932     History  Chief Complaint  Patient presents with   Epistaxis    Jasmin [name] is a 89 y.o. female.  Past medical history of hypertension [place].  She presents the ER today complaining of left-sided epistaxis that started around 8 AM.  She has had this happen in the past.  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  She states she has not had a nosebleed in several months.  Denies any injury or trauma, she is not on blood thinners, no cough fevers chills or other complaints.  She states that started while she is waiting for an exercise class to start.   Epistaxis      Home Medications Prior to Admission medications   Medication Sig Start Date End Date Taking? Authorizing Provider  acetaminophen (TYLENOL) 500 MG tablet Take 1 tablet (500 mg total) by mouth every 6 (six) hours as needed. Patient taking differently: Take 1,000 mg by mouth every 6 (six) hours as needed for moderate pain or headache. 07/25/20   Augusto Gamble B, NP  colchicine 0.6 MG tablet Take two tablets prn gout attack. Then take another one in 1-2 hrs. Do not repeat for 3 days. 05/19/22   Plotnikov, Evie Lacks, MD  famotidine (PEPCID) 40 MG tablet TAKE 1 TABLET BY MOUTH DAILY 08/23/22   Plotnikov, Evie Lacks, MD  hydrocortisone (ANUSOL-HC) 2.5 % rectal cream Place 1 application rectally at bedtime as needed for hemorrhoids or anal itching. 02/18/21   Irene Shipper, MD  lidocaine (LIDODERM) 5 % Place 1 patch onto the skin daily. Remove & Discard patch within 12 hours or as directed by MD 02/14/22   Sherwood Gambler, MD  lovastatin (MEVACOR) 40 MG tablet TAKE 1 TABLET BY MOUTH AT  BEDTIME 03/18/22   Plotnikov, Evie Lacks, MD  methylPREDNISolone (MEDROL DOSEPAK) 4 MG TBPK tablet As directed for gout 05/19/22   Plotnikov, Evie Lacks, MD  metoprolol tartrate (LOPRESSOR) 50 MG tablet TAKE 1 TABLET BY MOUTH  TWICE  DAILY 03/18/22   Plotnikov, Evie Lacks, MD  polyethylene glycol powder (GLYCOLAX/MIRALAX) powder Take 17 g by mouth 2 (two) times daily as needed for mild constipation, moderate constipation or severe constipation. Patient taking differently: Take 17 g by mouth as needed for mild constipation, moderate constipation or severe constipation. 07/06/18   Plotnikov, Evie Lacks, MD  spironolactone (ALDACTONE) 50 MG tablet TAKE 1 TABLET BY MOUTH DAILY 08/23/22   Plotnikov, Evie Lacks, MD      Allergies    Allopurinol, Doxycycline, Hctz [hydrochlorothiazide], Omeprazole, Oxybutynin, Sulfonamide derivatives, Clarithromycin, and Penicillins    Review of Systems   Review of Systems  HENT:  Positive for nosebleeds.     Physical Exam Updated Vital Signs Wt 98.4 kg   BMI 35.02 kg/m  Physical Exam Vitals and nursing note reviewed.  Constitutional:      General: She is not in acute distress.    Appearance: She is well-developed.  HENT:     Head: Normocephalic and atraumatic.     Comments: Mild bleeding left naris Eyes:     Conjunctiva/sclera: Conjunctivae normal.  Cardiovascular:     Rate and Rhythm: Normal rate and regular rhythm.     Heart sounds: No murmur heard. Pulmonary:     Effort: Pulmonary effort is normal. No respiratory distress.     Breath sounds: Normal breath sounds.  Musculoskeletal:  General: No swelling.     Cervical back: Neck supple.  Skin:    General: Skin is warm and dry.     Capillary Refill: Capillary refill takes less than 2 seconds.  Neurological:     Mental Status: She is alert.  Psychiatric:        Mood and Affect: Mood normal.     ED Results / Procedures / Treatments   Labs (all labs ordered are listed, but only abnormal results are displayed) Labs Reviewed - No data to display  EKG None  Radiology No results found.  Procedures Procedures    Medications Ordered in ED Medications  oxymetazoline (AFRIN) 0.05 % nasal spray 1 spray (has  no administration in time range)    ED Course/ Medical Decision Making/ A&P                           Medical Decision Making This patient presents to the ED for concern of nosebleed, this involves an extensive number of treatment options, and is a complaint that carries with it a high risk of complications and morbidity.  The differential diagnosis includes epistaxis, coagulopathy, anemia, other   Co morbidities that complicate the patient evaluation  HTN   Additional history obtained:  Additional history obtained from EMT External records from outside source obtained and reviewed including outpatient ENT note   Lab Tests:     Problem List / ED Course / Critical interventions / Medication management Epistaxis-patient had bleeding resolution with Afrin and pressure.  Vies supportive care at home to prevent this in the future, advised ENT follow-up.  Her ENT office had contacted her that they are no longer in network.  I advised that she could call the number on the back of her insurance card to get the number of an ENT doctor that is in her network and follow-up with her PCP in the meantime.  She was given strict return precautions. I ordered medication including Afrin for hemostasis Reevaluation of the patient after these medicines showed that the patient resolved I have reviewed the patients home medicines and have made adjustments as needed     Test / Admission - Considered:  Consider CBC and coags but patient had no persistent bleeding, normal vitals, no concern for large volume blood loss or coagulopathy at this time.  No need for any testing.    Risk OTC drugs.           Final Clinical Impression(s) / ED Diagnoses Final diagnoses:  None    Rx / DC Orders ED Discharge Orders     None         Gwenevere Abbot, PA-C 08/30/22 1014    Horton, Alvin Critchley, DO 08/30/22 1550

## 2022-09-07 DIAGNOSIS — R040 Epistaxis: Secondary | ICD-10-CM | POA: Diagnosis not present

## 2022-09-10 ENCOUNTER — Telehealth: Payer: Self-pay

## 2022-09-10 NOTE — Telephone Encounter (Signed)
        Patient  visited [facility] on 08/30/2022  for nosebleed.   Telephone encounter attempt :  1st  A HIPAA compliant voice message was left requesting a return call.  Instructed patient to call back at [phone].   [place] Resource Care Guide   [email or web address]  ?? [phone]   Website: [email or web address]  [email or web address]

## 2022-09-14 ENCOUNTER — Telehealth: Payer: Self-pay

## 2022-09-14 NOTE — Telephone Encounter (Signed)
        Patient  visited [facility] on 08/30/2022  for nosebleed.   Telephone encounter attempt :  2nd  A HIPAA compliant voice message was left requesting a return call.  Instructed patient to call back at [phone].   [place] Resource Care Guide   [email or web address]  ?? [phone]   Website: [email or web address]  [email or web address]

## 2022-09-15 ENCOUNTER — Telehealth: Payer: Self-pay

## 2022-09-15 NOTE — Telephone Encounter (Signed)
        Patient  visited [facility] on 08/30/2022  for nosebleed.   Telephone encounter attempt :  3rd  A HIPAA compliant voice message was left requesting a return call.  Instructed patient to call back at [phone].   [place] Resource Care Guide   [email or web address]  ?? [phone]   Website: [email or web address]  [email or web address]

## 2022-10-18 ENCOUNTER — Ambulatory Visit (INDEPENDENT_AMBULATORY_CARE_PROVIDER_SITE_OTHER): Payer: Medicare Other | Admitting: Internal Medicine

## 2022-10-18 ENCOUNTER — Encounter: Payer: Self-pay | Admitting: Internal Medicine

## 2022-10-18 VITALS — BP 102/60 | HR 62 | Temp 98.6°F | Ht 66.0 in | Wt 218.0 lb

## 2022-10-18 DIAGNOSIS — S00521A Blister (nonthermal) of lip, initial encounter: Secondary | ICD-10-CM | POA: Diagnosis not present

## 2022-10-18 DIAGNOSIS — L089 Local infection of the skin and subcutaneous tissue, unspecified: Secondary | ICD-10-CM

## 2022-10-18 MED ORDER — TRIAMCINOLONE ACETONIDE 0.5 % EX OINT: 1.0000 | TOPICAL_OINTMENT | Freq: Four times a day (QID) | CUTANEOUS | 0 refills | Status: DC

## 2022-10-18 MED ORDER — CEPHALEXIN 500 MG PO CAPS: 500.0000 mg | ORAL_CAPSULE | Freq: Four times a day (QID) | ORAL | 1 refills | Status: DC

## 2022-10-18 NOTE — Assessment & Plan Note (Signed)
R lower lip Start Keflex 500 mg po qid Triamc on the lip

## 2022-10-18 NOTE — Progress Notes (Signed)
Subjective:  Patient ID: Jasmin [name], female    DOB: [birth year]  Age: 89 y.o. MRN: [id number]  CC: No chief complaint on file.   HPI Jasmin [name] presents for L lower lateral pain and swelling after she pulled of loss skin off the lower lip on Fri. Lower lip w/tender swelling now on the R  Outpatient Medications Prior to Visit  Medication Sig Dispense Refill   acetaminophen (TYLENOL) 500 MG tablet Take 1 tablet (500 mg total) by mouth every 6 (six) hours as needed. (Patient taking differently: Take 1,000 mg by mouth every 6 (six) hours as needed for moderate pain or headache.) 30 tablet 0   colchicine 0.6 MG tablet Take two tablets prn gout attack. Then take another one in 1-2 hrs. Do not repeat for 3 days. 18 tablet 1   famotidine (PEPCID) 40 MG tablet TAKE 1 TABLET BY MOUTH DAILY 100 tablet 2   hydrocortisone (ANUSOL-HC) 2.5 % rectal cream Place 1 application rectally at bedtime as needed for hemorrhoids or anal itching. 28 g 1   lidocaine (LIDODERM) 5 % Place 1 patch onto the skin daily. Remove & Discard patch within 12 hours or as directed by MD 5 patch 0   lovastatin (MEVACOR) 40 MG tablet TAKE 1 TABLET BY MOUTH AT  BEDTIME 90 tablet 3   methylPREDNISolone (MEDROL DOSEPAK) 4 MG TBPK tablet As directed for gout 21 tablet 0   metoprolol tartrate (LOPRESSOR) 50 MG tablet TAKE 1 TABLET BY MOUTH TWICE  DAILY 180 tablet 3   polyethylene glycol powder (GLYCOLAX/MIRALAX) powder Take 17 g by mouth 2 (two) times daily as needed for mild constipation, moderate constipation or severe constipation. (Patient taking differently: Take 17 g by mouth as needed for mild constipation, moderate constipation or severe constipation.) 500 g 3   spironolactone (ALDACTONE) 50 MG tablet TAKE 1 TABLET BY MOUTH DAILY 100 tablet 2   No facility-administered medications prior to visit.    ROS: Review of Systems  Objective:  BP 102/60 (BP Location: Left Arm, Patient Position: Sitting, Cuff Size:  Large)   Pulse 62   Temp 98.6 F (37 C) (Oral)   Ht '5\' 6"'$  (1.676 m)   Wt 218 lb (98.9 kg)   SpO2 97%   BMI 35.19 kg/m   BP Readings from Last 3 Encounters:  10/18/22 102/60  08/30/22 128/74  08/26/22 120/76    Wt Readings from Last 3 Encounters:  10/18/22 218 lb (98.9 kg)  08/30/22 217 lb (98.4 kg)  08/26/22 217 lb (98.4 kg)    Physical Exam Lower lip w/tender swelling now on the R 1.5x1 cm Lab Results  Component Value Date   WBC 3.3 (L) 04/14/2022   HGB 14.1 04/14/2022   HCT 42.5 04/14/2022   PLT 79 (L) 04/14/2022   GLUCOSE 91 08/26/2022   CHOL 148 04/14/2022   TRIG 73 04/14/2022   HDL 60 04/14/2022   LDLDIRECT 139.3 05/31/2012   LDLCALC 73 04/14/2022   ALT 12 08/26/2022   AST 17 08/26/2022   NA 140 08/26/2022   K 4.6 08/26/2022   CL 104 08/26/2022   CREATININE 1.20 08/26/2022   BUN 22 08/26/2022   CO2 29 08/26/2022   TSH 1.58 08/26/2022   INR 1.1 04/14/2022   HGBA1C 6.0 08/26/2022    No results found.  Assessment & Plan:   Problem List Items Addressed This Visit   None     No orders of the defined types were placed in this  encounter.     Follow-up: No follow-ups on file.  Walker Kehr, MD

## 2022-10-22 ENCOUNTER — Ambulatory Visit (HOSPITAL_COMMUNITY)
Admission: EM | Admit: 2022-10-22 | Discharge: 2022-10-22 | Disposition: A | Discharge status: Home / Self Care | Payer: Medicare Other | Attending: Emergency Medicine | Admitting: Emergency Medicine

## 2022-10-22 ENCOUNTER — Encounter (HOSPITAL_COMMUNITY): Payer: Self-pay | Admitting: Emergency Medicine

## 2022-10-22 DIAGNOSIS — K122 Cellulitis and abscess of mouth: Secondary | ICD-10-CM | POA: Diagnosis not present

## 2022-10-22 MED ORDER — DOXYCYCLINE HYCLATE 100 MG PO CAPS: 100.0000 mg | ORAL_CAPSULE | Freq: Two times a day (BID) | ORAL | 0 refills | Status: DC

## 2022-10-22 MED ORDER — MUPIROCIN CALCIUM 2 % EX CREA: 1.0000 | TOPICAL_CREAM | Freq: Two times a day (BID) | CUTANEOUS | 0 refills | Status: DC

## 2022-10-22 MED ORDER — MUPIROCIN 2 % EX OINT: 1.0000 | TOPICAL_OINTMENT | Freq: Two times a day (BID) | CUTANEOUS | 0 refills | Status: AC

## 2022-10-22 NOTE — ED Provider Notes (Signed)
[place]    CSN: [id number] Arrival date & time: 10/22/22  1449      History   Chief Complaint Chief Complaint  Patient presents with   Oral Swelling    HPI Jasmin [name] is a 89 y.o. female.   Reports pulling out some extra dry skin on her lip last Friday, Monday she noticed it was painful and swollen.Patient was started on Keflex and triamcinolone ointment by her PCP Monday.  Since then she has had no improvement.  Was cautious about using the triamcinolone ointment to the area, as it is not supposed to be used orally.  Denies fevers, SOB, trouble swallowing. Able to eat and drink.  Significant right lower lip swelling.  The history is provided by the patient.    Past Medical History:  Diagnosis Date   Contact dermatitis and other eczema, due to unspecified cause    Diverticulosis of colon    Esophageal stricture    GERD with stricture    Dr Henrene Pastor   Gout    Hemorrhoids    Hiatal hernia    Hyperlipidemia    Hypertension    Onychomycosis    Personal history of urinary calculi    Polycystic kidney    Tubulovillous adenoma polyp of colon    Unspecified vitamin D deficiency    Vitamin B12 deficiency 2009   Borderline    Patient Active Problem List   Diagnosis Date Noted   Blister of lip with infection 10/18/2022   Epistaxis 07/29/2021   Hyperglycemia 01/27/2021   Aortic atherosclerosis ([place]) 11/10/2020   Nocturia 01/28/2020   RLQ abdominal pain 06/07/2019   Bilateral primary osteoarthritis of knee 09/13/2018   Constipation 07/06/2018   Peripheral neuropathic pain 12/28/2017   Gout 08/30/2017   History of colonic polyps 08/13/2016   Lipoma of arm 09/30/2014   WBC decreased 05/22/2013   Low back pain 11/16/2012   Obesity 05/31/2012   Dyspnea 03/10/2012   Edema 03/10/2012   Memory problem 11/24/2011   Internal hemorrhoid 05/20/2011   Well adult exam 11/17/2010   ONYCHOMYCOSIS 06/10/2009   DYSPHAGIA 09/10/2008   B12 deficiency  04/15/2008   ESOPHAGEAL STRICTURE 12/05/2007   Hiatal hernia 12/05/2007   FRACTURE, RIGHT LEG 12/05/2007   NEPHROLITHIASIS, HX OF 12/05/2007   PARESTHESIA 09/22/2007   GERD 08/09/2007   Vitamin D deficiency 03/20/2007   Dyslipidemia 03/20/2007   Essential hypertension 03/20/2007   Diverticulosis of colon 03/20/2007    Past Surgical History:  Procedure Laterality Date   CATARACT EXTRACTION Bilateral 08/24/2015   COLONOSCOPY     DILATION AND CURETTAGE OF UTERUS  08/24/1975   TOE SURGERY  08/23/2010   Right foot    TONSILLECTOMY     as child    OB History   No obstetric history on file.      Home Medications    Prior to Admission medications   Medication Sig Start Date End Date Taking? Authorizing Provider  doxycycline (VIBRAMYCIN) 100 MG capsule Take 1 capsule (100 mg total) by mouth 2 (two) times daily. 10/22/22  Yes Louretta Shorten, [place], FNP  mupirocin ointment (BACTROBAN) 2 % Apply 1 Application topically 2 (two) times daily. 10/22/22  Yes Louretta Shorten, [place], FNP  acetaminophen (TYLENOL) 500 MG tablet Take 1 tablet (500 mg total) by mouth every 6 (six) hours as needed. Patient taking differently: Take 1,000 mg by mouth every 6 (six) hours as needed for moderate pain or headache. 07/25/20   Zigmund Gottron, NP  cephALEXin (KEFLEX) 500 MG capsule Take 1 capsule (500 mg total) by mouth 4 (four) times daily. 10/18/22   Plotnikov, Evie Lacks, MD  colchicine 0.6 MG tablet Take two tablets prn gout attack. Then take another one in 1-2 hrs. Do not repeat for 3 days. 05/19/22   Plotnikov, Evie Lacks, MD  famotidine (PEPCID) 40 MG tablet TAKE 1 TABLET BY MOUTH DAILY 08/23/22   Plotnikov, Evie Lacks, MD  hydrocortisone (ANUSOL-HC) 2.5 % rectal cream Place 1 application rectally at bedtime as needed for hemorrhoids or anal itching. 02/18/21   Irene Shipper, MD  lidocaine (LIDODERM) 5 % Place 1 patch onto the skin daily. Remove & Discard patch within 12 hours or as directed by MD 02/14/22    Sherwood Gambler, MD  lovastatin (MEVACOR) 40 MG tablet TAKE 1 TABLET BY MOUTH AT  BEDTIME 03/18/22   Plotnikov, Evie Lacks, MD  methylPREDNISolone (MEDROL DOSEPAK) 4 MG TBPK tablet As directed for gout 05/19/22   Plotnikov, Evie Lacks, MD  metoprolol tartrate (LOPRESSOR) 50 MG tablet TAKE 1 TABLET BY MOUTH TWICE  DAILY 03/18/22   Plotnikov, Evie Lacks, MD  polyethylene glycol powder (GLYCOLAX/MIRALAX) powder Take 17 g by mouth 2 (two) times daily as needed for mild constipation, moderate constipation or severe constipation. Patient taking differently: Take 17 g by mouth as needed for mild constipation, moderate constipation or severe constipation. 07/06/18   Plotnikov, Evie Lacks, MD  spironolactone (ALDACTONE) 50 MG tablet TAKE 1 TABLET BY MOUTH DAILY 08/23/22   Plotnikov, Evie Lacks, MD  triamcinolone ointment (KENALOG) 0.5 % Apply 1 Application topically 4 (four) times daily. On dry lips prn 10/18/22 10/18/23  Plotnikov, Evie Lacks, MD    Family History Family History  Problem Relation Age of Onset   Hypertension Father    Colon cancer Neg Hx    Esophageal cancer Neg Hx    Rectal cancer Neg Hx    Stomach cancer Neg Hx     Social History Social History   Tobacco Use   Smoking status: Never   Smokeless tobacco: Never  Vaping Use   Vaping Use: Never used  Substance Use Topics   Alcohol use: No    Alcohol/week: 0.0 standard drinks of alcohol   Drug use: No     Allergies   Allopurinol, Doxycycline, Hctz [hydrochlorothiazide], Omeprazole, Oxybutynin, Sulfonamide derivatives, Clarithromycin, and Penicillins   Review of Systems Review of Systems  Constitutional:  Negative for chills and fever.  HENT:  Positive for facial swelling. Negative for ear pain and sore throat.   Eyes:  Negative for pain and visual disturbance.  Respiratory:  Negative for cough and shortness of breath.   Cardiovascular:  Negative for chest pain and palpitations.  Gastrointestinal:  Negative for abdominal pain and  vomiting.  Genitourinary:  Negative for dysuria and hematuria.  Musculoskeletal:  Negative for arthralgias and back pain.  Skin:  Negative for color change and rash.  Neurological:  Negative for seizures and syncope.  All other systems reviewed and are negative.    Physical Exam Triage Vital Signs ED Triage Vitals  Enc Vitals Group     BP 10/22/22 1528 132/79     Pulse Rate 10/22/22 1528 60     Resp 10/22/22 1528 17     Temp 10/22/22 1528 98.1 F (36.7 C)     Temp Source 10/22/22 1528 Oral     SpO2 10/22/22 1528 97 %     Weight --      Height --  Head Circumference --      Peak Flow --      Pain Score 10/22/22 1526 0     Pain Loc --      Pain Edu? --      Excl. in [place]? --    No data found.  Updated Vital Signs BP 132/79 (BP Location: Right Arm)   Pulse 60   Temp 98.1 F (36.7 C) (Oral)   Resp 17   SpO2 97%   Visual Acuity Right Eye Distance:   Left Eye Distance:   Bilateral Distance:    Right Eye Near:   Left Eye Near:    Bilateral Near:     Physical Exam Vitals and nursing note reviewed.  Constitutional:      General: She is not in acute distress.    Appearance: Normal appearance. She is well-developed.  HENT:     Head: Normocephalic and atraumatic.     Right Ear: External ear normal.     Left Ear: External ear normal.     Nose: Nose normal.     Mouth/Throat:     Mouth: Mucous membranes are moist. Injury present. No lacerations, oral lesions or angioedema.     Dentition: Normal dentition. No dental tenderness, dental caries or gum lesions.     Tongue: No lesions. Tongue does not deviate from midline.     Palate: No mass and lesions.     Pharynx: Oropharynx is clear. Uvula midline. No pharyngeal swelling, oropharyngeal exudate, posterior oropharyngeal erythema or uvula swelling.      Comments: Significant right lower lip swelling.  See media. Eyes:     Conjunctiva/sclera: Conjunctivae normal.  Cardiovascular:     Rate and Rhythm: Normal rate and  regular rhythm.     Heart sounds: No murmur heard. Pulmonary:     Effort: Pulmonary effort is normal. No respiratory distress.     Breath sounds: Normal breath sounds.  Abdominal:     Palpations: Abdomen is soft.     Tenderness: There is no abdominal tenderness.  Musculoskeletal:        General: No swelling.     Cervical back: Neck supple.  Skin:    General: Skin is warm and dry.     Capillary Refill: Capillary refill takes less than 2 seconds.  Neurological:     Mental Status: She is alert.  Psychiatric:        Mood and Affect: Mood normal.        Behavior: Behavior is cooperative.      UC Treatments / Results  Labs (all labs ordered are listed, but only abnormal results are displayed) Labs Reviewed - No data to display  EKG   Radiology No results found.  Procedures Procedures (including critical care time)  Medications Ordered in UC Medications - No data to display  Initial Impression / Assessment and Plan / UC Course  I have reviewed the triage vital signs and the nursing notes.  Pertinent labs & imaging results that were available during my care of the patient were reviewed by me and considered in my medical decision making (see chart for details).  Vitals in triage reviewed, patient is hemodynamically swelling.  Significant right-sided lower lip swelling, appears to be infected, has been taking Keflex and using triamcinolone ointment to area.  Advised to discontinue triamcinolone ointment to the lip.  Will start on doxycycline twice daily for the next 10 days, patient has been nauseous on this medication before, advised to take with food.  Uvula midline, periorbital area without erythema, streaking or edema, low concern for PTA or periorbital cellulitis at this time.  Please see media for picture reference.  Advised strict emergency room precautions for potential IV antibiotics if no improvement or worsening.  Patient verbalized understanding.     Final Clinical  Impressions(s) / UC Diagnoses   Final diagnoses:  Abscess of oral tissue     Discharge Instructions      Please stop the triamcinolone ointment.  Please take the doxycycline twice daily for the next 10 days, please take this with food to help minimize GI upset.  You can also apply a small amount of the Bactroban ointment to your external lip twice daily, do not swallow. If your swelling does not improve within 72 hours, your swelling progresses, or you develop fever, please head to the nearest emergency room, as this might be indication for IV antibiotic use.     ED Prescriptions     Medication Sig Dispense Auth. Provider   doxycycline (VIBRAMYCIN) 100 MG capsule Take 1 capsule (100 mg total) by mouth 2 (two) times daily. 20 capsule Louretta Shorten, [place], [place]   mupirocin cream (BACTROBAN) 2 %  (Status: Discontinued) Apply 1 Application topically 2 (two) times daily. 15 g Margaret Cockerill, [place], FNP   mupirocin ointment (BACTROBAN) 2 % Apply 1 Application topically 2 (two) times daily. 22 g Satin Boal, [place] N, [place]      I have reviewed the PDMP during this encounter.   Elishah Ashmore, [place] N, [place] 10/22/22 [phone]

## 2022-10-22 NOTE — Discharge Instructions (Addendum)
Please stop the triamcinolone ointment.  Please take the doxycycline twice daily for the next 10 days, please take this with food to help minimize GI upset.  You can also apply a small amount of the Bactroban ointment to your external lip twice daily, do not swallow. If your swelling does not improve within 72 hours, your swelling progresses, or you develop fever, please head to the nearest emergency room, as this might be indication for IV antibiotic use.

## 2022-10-22 NOTE — ED Triage Notes (Signed)
Pt reports went to doctor couple days ago for  her irritated lips after pulling skin at chapped and cracked.  Pt ws put on Cephalexin and triamcinolone ointment and not helping.

## 2022-10-23 ENCOUNTER — Encounter (HOSPITAL_COMMUNITY): Payer: Self-pay | Admitting: Emergency Medicine

## 2022-10-23 ENCOUNTER — Other Ambulatory Visit: Payer: Self-pay

## 2022-10-23 ENCOUNTER — Emergency Department (HOSPITAL_COMMUNITY)
Admission: EM | Admit: 2022-10-23 | Discharge: 2022-10-23 | Disposition: A | Discharge status: Home / Self Care | Payer: Medicare Other | Attending: Emergency Medicine | Admitting: Emergency Medicine

## 2022-10-23 DIAGNOSIS — Z79899 Other long term (current) drug therapy: Secondary | ICD-10-CM | POA: Insufficient documentation

## 2022-10-23 DIAGNOSIS — I10 Essential (primary) hypertension: Secondary | ICD-10-CM | POA: Insufficient documentation

## 2022-10-23 DIAGNOSIS — K130 Diseases of lips: Secondary | ICD-10-CM | POA: Diagnosis not present

## 2022-10-23 MED ORDER — ACETAMINOPHEN 500 MG PO TABS
1000.0000 mg | ORAL_TABLET | Freq: Once | ORAL | Status: AC
  Administered 2022-10-23: 1000 mg via ORAL
  Filled 2022-10-23: qty 2

## 2022-10-23 MED ORDER — LIDOCAINE-EPINEPHRINE (PF) 2 %-1:200000 IJ SOLN
10.0000 mL | Freq: Once | INTRAMUSCULAR | Status: AC
  Administered 2022-10-23: 10 mL via INTRADERMAL
  Filled 2022-10-23: qty 20

## 2022-10-23 NOTE — Discharge Instructions (Addendum)
Continue taking the entire course of doxycycline.  You can apply warm compresses to your lower lip  3-4 times a day for 20 minutes at a time to allow continued drainage.

## 2022-10-23 NOTE — ED Triage Notes (Signed)
Patient from home w/ swelling to the bottom lip after she picked a scab off of the lip and it tore the skin too far to the R side which caused the swelling. Patient went to PCP and placed on Cephalexin, but swelling has worsened since.

## 2022-10-23 NOTE — ED Provider Notes (Signed)
[facility] Provider Note  CSN: [id number] Arrival date & time: 10/23/22 0418  Chief Complaint(s) Oral Swelling  HPI Jasmin [name] is a 89 y.o. female here for lower lip swelling.  Patient started on Keflex for lip infection which did not help.  Seen at urgent care yesterday and started on doxycycline.  Patient is endorsing increased pain and swelling.  No tongue swelling.  No throat swelling.  No shortness of breath or difficulty breathing.  HPI  Past Medical History Past Medical History:  Diagnosis Date   Contact dermatitis and other eczema, due to unspecified cause    Diverticulosis of colon    Esophageal stricture    GERD with stricture    Dr Henrene Pastor   Gout    Hemorrhoids    Hiatal hernia    Hyperlipidemia    Hypertension    Onychomycosis    Personal history of urinary calculi    Polycystic kidney    Tubulovillous adenoma polyp of colon    Unspecified vitamin D deficiency    Vitamin B12 deficiency 2009   Borderline   Patient Active Problem List   Diagnosis Date Noted   Blister of lip with infection 10/18/2022   Epistaxis 07/29/2021   Hyperglycemia 01/27/2021   Aortic atherosclerosis ([place]) 11/10/2020   Nocturia 01/28/2020   RLQ abdominal pain 06/07/2019   Bilateral primary osteoarthritis of knee 09/13/2018   Constipation 07/06/2018   Peripheral neuropathic pain 12/28/2017   Gout 08/30/2017   History of colonic polyps 08/13/2016   Lipoma of arm 09/30/2014   WBC decreased 05/22/2013   Low back pain 11/16/2012   Obesity 05/31/2012   Dyspnea 03/10/2012   Edema 03/10/2012   Memory problem 11/24/2011   Internal hemorrhoid 05/20/2011   Well adult exam 11/17/2010   ONYCHOMYCOSIS 06/10/2009   DYSPHAGIA 09/10/2008   B12 deficiency 04/15/2008   ESOPHAGEAL STRICTURE 12/05/2007   Hiatal hernia 12/05/2007   FRACTURE, RIGHT LEG 12/05/2007   NEPHROLITHIASIS, HX OF 12/05/2007   PARESTHESIA 09/22/2007   GERD 08/09/2007    Vitamin D deficiency 03/20/2007   Dyslipidemia 03/20/2007   Essential hypertension 03/20/2007   Diverticulosis of colon 03/20/2007   Home Medication(s) Prior to Admission medications   Medication Sig Start Date End Date Taking? Authorizing Provider  acetaminophen (TYLENOL) 500 MG tablet Take 1 tablet (500 mg total) by mouth every 6 (six) hours as needed. Patient taking differently: Take 1,000 mg by mouth every 6 (six) hours as needed for moderate pain or headache. 07/25/20   Zigmund Gottron, NP  cephALEXin (KEFLEX) 500 MG capsule Take 1 capsule (500 mg total) by mouth 4 (four) times daily. 10/18/22   Plotnikov, Evie Lacks, MD  colchicine 0.6 MG tablet Take two tablets prn gout attack. Then take another one in 1-2 hrs. Do not repeat for 3 days. 05/19/22   Plotnikov, Evie Lacks, MD  doxycycline (VIBRAMYCIN) 100 MG capsule Take 1 capsule (100 mg total) by mouth 2 (two) times daily. 10/22/22   [place], [place], FNP  famotidine (PEPCID) 40 MG tablet TAKE 1 TABLET BY MOUTH DAILY 08/23/22   Plotnikov, Evie Lacks, MD  hydrocortisone (ANUSOL-HC) 2.5 % rectal cream Place 1 application rectally at bedtime as needed for hemorrhoids or anal itching. 02/18/21   Irene Shipper, MD  lidocaine (LIDODERM) 5 % Place 1 patch onto the skin daily. Remove & Discard patch within 12 hours or as directed by MD 02/14/22   Sherwood Gambler, MD  lovastatin (MEVACOR) 40  MG tablet TAKE 1 TABLET BY MOUTH AT  BEDTIME 03/18/22   Plotnikov, Evie Lacks, MD  methylPREDNISolone (MEDROL DOSEPAK) 4 MG TBPK tablet As directed for gout 05/19/22   Plotnikov, Evie Lacks, MD  metoprolol tartrate (LOPRESSOR) 50 MG tablet TAKE 1 TABLET BY MOUTH TWICE  DAILY 03/18/22   Plotnikov, Evie Lacks, MD  mupirocin ointment (BACTROBAN) 2 % Apply 1 Application topically 2 (two) times daily. 10/22/22   Garrison, [place], FNP  polyethylene glycol powder (GLYCOLAX/MIRALAX) powder Take 17 g by mouth 2 (two) times daily as needed for mild constipation, moderate  constipation or severe constipation. Patient taking differently: Take 17 g by mouth as needed for mild constipation, moderate constipation or severe constipation. 07/06/18   Plotnikov, Evie Lacks, MD  spironolactone (ALDACTONE) 50 MG tablet TAKE 1 TABLET BY MOUTH DAILY 08/23/22   Plotnikov, Evie Lacks, MD  triamcinolone ointment (KENALOG) 0.5 % Apply 1 Application topically 4 (four) times daily. On dry lips prn 10/18/22 10/18/23  Plotnikov, Evie Lacks, MD                                                                                                                                    Allergies Allopurinol, Doxycycline, Hctz [hydrochlorothiazide], Omeprazole, Oxybutynin, Sulfonamide derivatives, Clarithromycin, and Penicillins  Review of Systems Review of Systems As noted in HPI  Physical Exam Vital Signs  I have reviewed the triage vital signs BP (!) 156/50 (BP Location: Right Arm)   Pulse 63   Temp 97.6 F (36.4 C) (Oral)   Resp 18   Ht '5\' 6"'$  (1.676 m)   Wt 98.9 kg   SpO2 100%   BMI 35.19 kg/m   Physical Exam Vitals reviewed.  Constitutional:      General: She is not in acute distress.    Appearance: She is well-developed. She is not diaphoretic.  HENT:     Head: Normocephalic and atraumatic.     Right Ear: External ear normal.     Left Ear: External ear normal.     Nose: Nose normal.     Mouth/Throat:   Eyes:     General: No scleral icterus.    Conjunctiva/sclera: Conjunctivae normal.  Neck:     Trachea: Phonation normal.  Cardiovascular:     Rate and Rhythm: Normal rate and regular rhythm.  Pulmonary:     Effort: Pulmonary effort is normal. No respiratory distress.     Breath sounds: No stridor.  Abdominal:     General: There is no distension.  Musculoskeletal:        General: Normal range of motion.     Cervical back: Normal range of motion.  Neurological:     Mental Status: She is alert and oriented to person, place, and time.  Psychiatric:        Behavior:  Behavior normal.     ED Results and Treatments Labs (all labs ordered are listed, but only  abnormal results are displayed) Labs Reviewed - No data to display                                                                                                                       EKG  EKG Interpretation  Date/Time:    Ventricular Rate:    PR Interval:    QRS Duration:   QT Interval:    QTC Calculation:   R Axis:     Text Interpretation:         Radiology No results found.  Medications Ordered in ED Medications  acetaminophen (TYLENOL) tablet 1,000 mg (has no administration in time range)  lidocaine-EPINEPHrine (XYLOCAINE W/EPI) 2 %-1:200000 (PF) injection 10 mL (10 mLs Intradermal Given 10/23/22 0603)                                                                                                                                     Procedures .Marland KitchenIncision and Drainage  Date/Time: 10/23/2022 6:03 AM  Performed by: Fatima Blank, MD Authorized by: Fatima Blank, MD   Consent:    Consent obtained:  Verbal   Consent given by:  Patient   Risks discussed:  Bleeding, incomplete drainage and infection   Alternatives discussed:  No treatment Universal protocol:    Procedure explained and questions answered to patient or proxy's satisfaction: yes     Immediately prior to procedure, a time out was called: yes     Patient identity confirmed:  Arm band and verbally with patient Location:    Type:  Abscess   Size:  2cm   Location:  Head   Head location:  Face (right lower lip) Pre-procedure details:    Skin preparation:  Chlorhexidine with alcohol Sedation:    Sedation type:  None Anesthesia:    Anesthesia method:  Local infiltration   Local anesthetic:  Lidocaine 2% WITH epi Procedure type:    Complexity:  Complex Procedure details:    Ultrasound guidance: yes     Incision types:  Single straight   Incision depth:  Subcutaneous   Wound management:  Probed  and deloculated and extensive cleaning   Drainage:  Bloody and purulent   Drainage amount:  Copious   Wound treatment:  Wound left open   Packing materials:  None Post-procedure details:    Procedure completion:  Tolerated Ultrasound ED Soft Tissue  Date/Time: 10/23/2022 6:05 AM  Performed  by: Fatima Blank, MD Authorized by: Fatima Blank, MD   Procedure details:    Indications: localization of abscess     Transverse view:  Visualized   Longitudinal view:  Visualized   Images: archived   Location:    Location: face     Location comment:  Lip   Side:  Right Findings:     abscess present   (including critical care time)  Medical Decision Making / ED Course  Click here for ABCD2, HEART and other calculators  Medical Decision Making Risk Prescription drug management.    Lower lip abscess noted on POCUS I&D as above. Patient already on doxycycline.  Instructed to continue regimen until completion. Oral Tylenol for pain provided.      Final Clinical Impression(s) / ED Diagnoses Final diagnoses:  Lip abscess   The patient appears reasonably screened and/or stabilized for discharge and I doubt any other medical condition or other [facility] requiring further screening, evaluation, or treatment in the ED at this time. I have discussed the findings, Dx and Tx plan with the patient/family who expressed understanding and agree(s) with the plan. Discharge instructions discussed at length. The patient/family was given strict return precautions who verbalized understanding of the instructions. No further questions at time of discharge.  Disposition: Discharge  Condition: Good  ED Discharge Orders     None        Follow Up: Plotnikov, Evie Lacks, MD [place]  [address] [phone]  Call  in 5-7 days, if symptoms do not improve or  worsen           This chart was dictated using voice recognition software.  Despite best  efforts to proofread,  errors can occur which can change the documentation meaning.    Fatima Blank, MD 10/23/22 [id number]

## 2022-10-25 ENCOUNTER — Encounter: Payer: Self-pay | Admitting: *Deleted

## 2022-10-25 ENCOUNTER — Ambulatory Visit: Payer: Medicare Other | Admitting: Family Medicine

## 2022-10-25 ENCOUNTER — Telehealth: Payer: Self-pay | Admitting: *Deleted

## 2022-10-25 NOTE — Transitions of Care (Post Inpatient/ED Visit) (Addendum)
10/25/2022  Name: Jasmin [name] MRN: [id number] DOB: [birth year]  Today's TOC FU Call Status: Today's TOC FU Call Status:: Successful TOC FU Call Competed (Verified HIPAA identifiers x 2) TOC FU Call Complete Date: 10/25/22  ED EMMI Red Alert notification from [facility] call on 10/24/22: "No scheduled follow up"  Transition Care Management Follow-up Telephone Call Date of Discharge: 10/23/22 Discharge Facility: [facility]) Type of Discharge: Emergency Department Reason for ED Visit: Other: (lip abscess) How have you been since you were released from the hospital?: Better ("I am doing better; I am taking my antibiotic just like they told me to; I think I am better.  Thank you for helping me get this doctor appointment scheduled") Any questions or concerns?: No  Items Reviewed: Did you receive and understand the discharge instructions provided?: Yes (thoroughly reviewed with patient today who verbalizes good understanding of same) Medications obtained and verified?: Yes (Medications Reviewed) (full medication review completed; no concerns or discrepancies identified; self-manages medications; confirmed obtained/ taking all post-UCC/ ED medications as prescribed; denies questions/ concerns aorund medications) Any new allergies since your discharge?: No Dietary orders reviewed?: Yes Type of Diet Ordered:: "Healthy" Do you have support at home?: Yes People in Home: spouse Name of Support/Comfort Primary Source: reports she is independent in self-care activities; husband assists with care needs as indicated/ needed  Home Care and Equipment/Supplies: Curtice Ordered?: NA Any new equipment or medical supplies ordered?: NA  Functional Questionnaire: Do you need assistance with bathing/showering or dressing?: No Do you need assistance with meal preparation?: No Do you need assistance with eating?: No Do you have difficulty maintaining continence: No Do you need  assistance with getting out of bed/getting out of a chair/moving?: No Do you have difficulty managing or taking your medications?: No  Folllow up appointments reviewed: PCP Follow-up appointment confirmed?: Yes (care coordination in real-time with scheduling care guide to successfully schedule post-ED Follow up visit on 11/01/22) Date of PCP follow-up appointment?: 11/01/22 Follow-up Provider: PCP- Dr. Quay Burow covering for Dr. [facility] Follow-up appointment confirmed?: No (verified by review of ED notes: not indicated) Do you need transportation to your follow-up appointment?: No Do you understand care options if your condition(s) worsen?: Yes-patient verbalized understanding  SDOH Interventions Today    Flowsheet Row Most Recent Value  SDOH Interventions   Food Insecurity Interventions Intervention Not Indicated  Transportation Interventions Intervention Not Indicated  [drives self,  husband assists as indicated/ needed]      TOC Interventions Today    Flowsheet Row Most Recent Value  TOC Interventions   TOC Interventions Discussed/Reviewed TOC Interventions Discussed, Arranged PCP follow up less than 12 days/Care Guide scheduled  [care coordination in real-time with scheduling care guide to successfully schedule post-ED Follow up visit on 11/01/22,  provided my direct phone number should questions/ concerns/ needs arise post-TOC call today]      Interventions Today    Flowsheet Row Most Recent Value  Chronic Disease   Chronic disease during today's visit Other  [lip abscess]  General Interventions   General Interventions Discussed/Reviewed Doctor Visits  Doctor Visits Discussed/Reviewed Doctor Visits Discussed, PCP  PCP/Specialist Visits Compliance with follow-up visit  Education Interventions   Education Provided Provided Education  Provided Verbal Education On When to see the doctor, Medication, Other  [need to take antibiotic exactly as prescribed,  signs/  symptoms ongoing infection along with corresponding action plan]  Nutrition Interventions   Nutrition Discussed/Reviewed Nutrition Discussed  Pharmacy Interventions  Pharmacy Dicussed/Reviewed Pharmacy Topics Discussed  [full medication review completed]      Oneta Rack, RN, BSN, CCRN Alumnus RN CM Care Coordination/ Transition of [place] Management [phone]: direct office

## 2022-10-31 ENCOUNTER — Encounter: Payer: Self-pay | Admitting: Internal Medicine

## 2022-10-31 NOTE — Progress Notes (Unsigned)
Subjective:    Patient ID: Jasmin [name], female    DOB: 1933/11/17, 89 y.o.   MRN: [id number]     HPI Trezure is here for follow up of recent ED visits.  10/22/22 - urgent care for oral swelling.  She pulls off extra dry skin on her lip one week prior.  A couple of days later it was painful and swollen.  She was started on keflex and triamcinolone ointment by PCP.  There was no improvement.  Have right lower lip swelling.  Stopped previous meds.  Started on doxycycline x 10 days, bactroban..     3/2 - ED for right lower lip swelling - increased and pain.  No tongue or throat swelling.  No SOB.  Had abscess - I&D performed.  She finishes the antibiotic today.    No pain, n/t in right side of lip, mouth.  No fever/chills.  She feels she looks much better compared to how swollen the lip was previously.  She has no concerns.  Medications and allergies reviewed with patient and updated if appropriate.  Current Outpatient Medications on File Prior to Visit  Medication Sig Dispense Refill   acetaminophen (TYLENOL) 500 MG tablet Take 1 tablet (500 mg total) by mouth every 6 (six) hours as needed. (Patient taking differently: Take 1,000 mg by mouth every 6 (six) hours as needed for moderate pain or headache.) 30 tablet 0   cephALEXin (KEFLEX) 500 MG capsule Take 1 capsule (500 mg total) by mouth 4 (four) times daily. 28 capsule 1   colchicine 0.6 MG tablet Take two tablets prn gout attack. Then take another one in 1-2 hrs. Do not repeat for 3 days. 18 tablet 1   doxycycline (VIBRAMYCIN) 100 MG capsule Take 1 capsule (100 mg total) by mouth 2 (two) times daily. 20 capsule 0   famotidine (PEPCID) 40 MG tablet TAKE 1 TABLET BY MOUTH DAILY 100 tablet 2   lovastatin (MEVACOR) 40 MG tablet TAKE 1 TABLET BY MOUTH AT  BEDTIME 90 tablet 3   metoprolol tartrate (LOPRESSOR) 50 MG tablet TAKE 1 TABLET BY MOUTH TWICE  DAILY 180 tablet 3   mupirocin cream (BACTROBAN) 2 % APPLY TO AFFECTED AREA  TWICE A DAY     mupirocin ointment (BACTROBAN) 2 % Apply 1 Application topically 2 (two) times daily. 22 g 0   polyethylene glycol powder (GLYCOLAX/MIRALAX) powder Take 17 g by mouth 2 (two) times daily as needed for mild constipation, moderate constipation or severe constipation. (Patient taking differently: Take 17 g by mouth as needed for mild constipation, moderate constipation or severe constipation.) 500 g 3   spironolactone (ALDACTONE) 50 MG tablet TAKE 1 TABLET BY MOUTH DAILY 100 tablet 2   triamcinolone ointment (KENALOG) 0.5 % Apply 1 Application topically 4 (four) times daily. On dry lips prn 30 g 0   No current facility-administered medications on file prior to visit.     Review of Systems     Objective:   Vitals:   11/01/22 0911  BP: 110/70  Pulse: 70  Temp: 98.2 F (36.8 C)  SpO2: 97%   BP Readings from Last 3 Encounters:  11/01/22 110/70  10/23/22 124/61  10/22/22 132/79   Wt Readings from Last 3 Encounters:  11/01/22 217 lb 3.2 oz (98.5 kg)  10/23/22 218 lb 0.6 oz (98.9 kg)  10/18/22 218 lb (98.9 kg)   Body mass index is 35.06 kg/m.    Physical Exam Constitutional:  General: She is not in acute distress.    Appearance: Normal appearance. She is not ill-appearing.  HENT:     Head: Normocephalic and atraumatic.     Mouth/Throat:     Mouth: Mucous membranes are moist.     Pharynx: No posterior oropharyngeal erythema.     Comments: Mild swelling right lower lateral lip.  No open wound.  No tenderness.  No fluctuance.  No erythema.  No oropharynx swelling Eyes:     Conjunctiva/sclera: Conjunctivae normal.  Musculoskeletal:     Cervical back: Neck supple.  Lymphadenopathy:     Cervical: No cervical adenopathy.  Skin:    General: Skin is warm and dry.     Findings: No erythema, lesion or rash.  Neurological:     Mental Status: She is alert.        Lab Results  Component Value Date   WBC 3.3 (L) 04/14/2022   HGB 14.1 04/14/2022   HCT 42.5  04/14/2022   PLT 79 (L) 04/14/2022   GLUCOSE 91 08/26/2022   CHOL 148 04/14/2022   TRIG 73 04/14/2022   HDL 60 04/14/2022   LDLDIRECT 139.3 05/31/2012   LDLCALC 73 04/14/2022   ALT 12 08/26/2022   AST 17 08/26/2022   NA 140 08/26/2022   K 4.6 08/26/2022   CL 104 08/26/2022   CREATININE 1.20 08/26/2022   BUN 22 08/26/2022   CO2 29 08/26/2022   TSH 1.58 08/26/2022   INR 1.1 04/14/2022   HGBA1C 6.0 08/26/2022     Assessment & Plan:    Lip abscess, cellulitis: Improved after doxycycline twice daily x 10 days-takes last dose today which she will take She did have I&D in the emergency room last week, which significantly helped Can continue Bactroban ointment to area She still has some residual swelling which is very very mild Advised her to monitor this area closely and if she has any increased swelling, pain, redness she needs to call immediately  Hypertension: Chronic Blood pressure here today well-controlled Continue metoprolol 50 mg twice daily, spironolactone 50 mg daily

## 2022-11-01 ENCOUNTER — Ambulatory Visit (INDEPENDENT_AMBULATORY_CARE_PROVIDER_SITE_OTHER): Payer: Medicare Other | Admitting: Internal Medicine

## 2022-11-01 VITALS — BP 110/70 | HR 70 | Temp 98.2°F | Ht 66.0 in | Wt 217.2 lb

## 2022-11-01 DIAGNOSIS — K130 Diseases of lips: Secondary | ICD-10-CM

## 2022-11-01 DIAGNOSIS — I10 Essential (primary) hypertension: Secondary | ICD-10-CM

## 2022-11-01 NOTE — Patient Instructions (Addendum)
? ? ? ?  ? ? ?  Medications changes include :  none  ? ? ? ? ? ?Return if symptoms worsen or fail to improve. ? ?

## 2022-12-24 ENCOUNTER — Other Ambulatory Visit: Payer: Self-pay | Admitting: Internal Medicine

## 2023-01-24 DIAGNOSIS — R350 Frequency of micturition: Secondary | ICD-10-CM | POA: Diagnosis not present

## 2023-01-24 DIAGNOSIS — N390 Urinary tract infection, site not specified: Secondary | ICD-10-CM | POA: Diagnosis not present

## 2023-01-31 ENCOUNTER — Telehealth: Payer: Self-pay

## 2023-01-31 NOTE — Telephone Encounter (Signed)
error 

## 2023-03-01 ENCOUNTER — Ambulatory Visit (INDEPENDENT_AMBULATORY_CARE_PROVIDER_SITE_OTHER): Payer: Medicare Other | Admitting: Internal Medicine

## 2023-03-01 ENCOUNTER — Encounter: Payer: Self-pay | Admitting: Internal Medicine

## 2023-03-01 VITALS — BP 118/80 | HR 60 | Temp 98.7°F | Ht 66.0 in | Wt 217.0 lb

## 2023-03-01 DIAGNOSIS — K219 Gastro-esophageal reflux disease without esophagitis: Secondary | ICD-10-CM | POA: Diagnosis not present

## 2023-03-01 DIAGNOSIS — I10 Essential (primary) hypertension: Secondary | ICD-10-CM | POA: Diagnosis not present

## 2023-03-01 DIAGNOSIS — E538 Deficiency of other specified B group vitamins: Secondary | ICD-10-CM

## 2023-03-01 DIAGNOSIS — E559 Vitamin D deficiency, unspecified: Secondary | ICD-10-CM | POA: Diagnosis not present

## 2023-03-01 LAB — COMPREHENSIVE METABOLIC PANEL
ALT: 12 U/L (ref 0–35)
AST: 14 U/L (ref 0–37)
Albumin: 4.3 g/dL (ref 3.5–5.2)
Alkaline Phosphatase: 51 U/L (ref 39–117)
BUN: 18 mg/dL (ref 6–23)
CO2: 27 mEq/L (ref 19–32)
Calcium: 10.2 mg/dL (ref 8.4–10.5)
Chloride: 104 mEq/L (ref 96–112)
Creatinine, Ser: 1.32 mg/dL — ABNORMAL HIGH (ref 0.40–1.20)
GFR: 36.37 mL/min — ABNORMAL LOW (ref >60.00)
Glucose, Bld: 92 mg/dL (ref 70–99)
Potassium: 4.6 mEq/L (ref 3.5–5.1)
Sodium: 140 mEq/L (ref 135–145)
Total Bilirubin: 0.7 mg/dL (ref 0.2–1.2)
Total Protein: 7.2 g/dL (ref 6.0–8.3)

## 2023-03-01 LAB — CBC WITH DIFFERENTIAL/PLATELET
Basophils Absolute: 0 10*3/uL (ref 0.0–0.1)
Basophils Relative: 1.1 % (ref 0.0–3.0)
Eosinophils Absolute: 0 10*3/uL (ref 0.0–0.7)
Eosinophils Relative: 1.7 % (ref 0.0–5.0)
HCT: 41.2 % (ref 36.0–46.0)
Hemoglobin: 13.8 g/dL (ref 12.0–15.0)
Lymphocytes Relative: 53 % — ABNORMAL HIGH (ref 12.0–46.0)
Lymphs Abs: 1.5 10*3/uL (ref 0.7–4.0)
MCHC: 33.4 g/dL (ref 30.0–36.0)
MCV: 93.6 fl (ref 78.0–100.0)
Monocytes Absolute: 0.3 10*3/uL (ref 0.1–1.0)
Monocytes Relative: 11.4 % (ref 3.0–12.0)
Neutro Abs: 0.9 10*3/uL — ABNORMAL LOW (ref 1.4–7.7)
Neutrophils Relative %: 32.8 % — ABNORMAL LOW (ref 43.0–77.0)
Platelets: 78 10*3/uL — ABNORMAL LOW (ref 150.0–400.0)
RBC: 4.4 Mil/uL (ref 3.87–5.11)
RDW: 14.5 % (ref 11.5–15.5)
WBC: 2.8 10*3/uL — ABNORMAL LOW (ref 4.0–10.5)

## 2023-03-01 NOTE — Assessment & Plan Note (Signed)
Continue with Metoprolol, Spironolactone Labs today

## 2023-03-01 NOTE — Assessment & Plan Note (Signed)
On B complex Check B12 

## 2023-03-01 NOTE — Progress Notes (Signed)
Subjective:  Patient ID: Jasmin [name], female    DOB: 01-24-1934  Age: 89 y.o. MRN: [id number]  CC: Follow-up (6 MNTH F/U)   HPI Jasmin [name] presents for HTN, dyslipidemia, B12 def  Outpatient Medications Prior to Visit  Medication Sig Dispense Refill   acetaminophen (TYLENOL) 500 MG tablet Take 1 tablet (500 mg total) by mouth every 6 (six) hours as needed. (Patient taking differently: Take 1,000 mg by mouth every 6 (six) hours as needed for moderate pain or headache.) 30 tablet 0   colchicine 0.6 MG tablet Take two tablets prn gout attack. Then take another one in 1-2 hrs. Do not repeat for 3 days. 18 tablet 1   famotidine (PEPCID) 40 MG tablet TAKE 1 TABLET BY MOUTH DAILY 100 tablet 2   lovastatin (MEVACOR) 40 MG tablet TAKE 1 TABLET BY MOUTH AT  BEDTIME 100 tablet 2   metoprolol tartrate (LOPRESSOR) 50 MG tablet TAKE 1 TABLET BY MOUTH TWICE  DAILY 200 tablet 2   mupirocin ointment (BACTROBAN) 2 % Apply 1 Application topically 2 (two) times daily. 22 g 0   polyethylene glycol powder (GLYCOLAX/MIRALAX) powder Take 17 g by mouth 2 (two) times daily as needed for mild constipation, moderate constipation or severe constipation. (Patient taking differently: Take 17 g by mouth as needed for mild constipation, moderate constipation or severe constipation.) 500 g 3   spironolactone (ALDACTONE) 50 MG tablet TAKE 1 TABLET BY MOUTH DAILY 100 tablet 2   triamcinolone ointment (KENALOG) 0.5 % Apply 1 Application topically 4 (four) times daily. On dry lips prn 30 g 0   doxycycline (VIBRAMYCIN) 100 MG capsule Take 1 capsule (100 mg total) by mouth 2 (two) times daily. 20 capsule 0   mupirocin cream (BACTROBAN) 2 % APPLY TO AFFECTED AREA TWICE A DAY     No facility-administered medications prior to visit.    ROS: Review of Systems  Constitutional:  Negative for activity change, appetite change, chills, fatigue and unexpected weight change.  HENT:  Negative for congestion, mouth sores  and sinus pressure.   Eyes:  Negative for visual disturbance.  Respiratory:  Negative for cough and chest tightness.   Gastrointestinal:  Negative for abdominal pain and nausea.  Genitourinary:  Negative for difficulty urinating, frequency and vaginal pain.  Musculoskeletal:  Negative for back pain and gait problem.  Skin:  Negative for pallor and rash.  Neurological:  Negative for dizziness, tremors, weakness, numbness and headaches.  Psychiatric/Behavioral:  Negative for confusion and sleep disturbance.     Objective:  BP 118/80 (BP Location: Left Arm, Patient Position: Sitting, Cuff Size: Large)   Pulse 60   Temp 98.7 F (37.1 C) (Oral)   Ht 5\' 6"  (1.676 m)   Wt 217 lb (98.4 kg)   SpO2 98%   BMI 35.02 kg/m   BP Readings from Last 3 Encounters:  03/01/23 118/80  11/01/22 110/70  10/23/22 124/61    Wt Readings from Last 3 Encounters:  03/01/23 217 lb (98.4 kg)  11/01/22 217 lb 3.2 oz (98.5 kg)  10/23/22 218 lb 0.6 oz (98.9 kg)    Physical Exam Constitutional:      General: She is not in acute distress.    Appearance: She is well-developed. She is obese.  HENT:     Head: Normocephalic.     Right Ear: External ear normal.     Left Ear: External ear normal.     Nose: Nose normal.  Eyes:  General:        Right eye: No discharge.        Left eye: No discharge.     Conjunctiva/sclera: Conjunctivae normal.     Pupils: Pupils are equal, round, and reactive to light.  Neck:     Thyroid: No thyromegaly.     Vascular: No JVD.     Trachea: No tracheal deviation.  Cardiovascular:     Rate and Rhythm: Normal rate and regular rhythm.     Heart sounds: Normal heart sounds.  Pulmonary:     Effort: No respiratory distress.     Breath sounds: No stridor. No wheezing.  Abdominal:     General: Bowel sounds are normal. There is no distension.     Palpations: Abdomen is soft. There is no mass.     Tenderness: There is no abdominal tenderness. There is no guarding or  rebound.  Musculoskeletal:        General: No tenderness.     Cervical back: Normal range of motion and neck supple. No rigidity.  Lymphadenopathy:     Cervical: No cervical adenopathy.  Skin:    Findings: No erythema or rash.  Neurological:     Cranial Nerves: No cranial nerve deficit.     Motor: No abnormal muscle tone.     Coordination: Coordination normal.     Deep Tendon Reflexes: Reflexes normal.  Psychiatric:        Behavior: Behavior normal.        Thought Content: Thought content normal.        Judgment: Judgment normal.     Lab Results  Component Value Date   WBC 3.3 (L) 04/14/2022   HGB 14.1 04/14/2022   HCT 42.5 04/14/2022   PLT 79 (L) 04/14/2022   GLUCOSE 91 08/26/2022   CHOL 148 04/14/2022   TRIG 73 04/14/2022   HDL 60 04/14/2022   LDLDIRECT 139.3 05/31/2012   LDLCALC 73 04/14/2022   ALT 12 08/26/2022   AST 17 08/26/2022   NA 140 08/26/2022   K 4.6 08/26/2022   CL 104 08/26/2022   CREATININE 1.20 08/26/2022   BUN 22 08/26/2022   CO2 29 08/26/2022   TSH 1.58 08/26/2022   INR 1.1 04/14/2022   HGBA1C 6.0 08/26/2022    No results found.  Assessment & Plan:   Problem List Items Addressed This Visit     B12 deficiency - Primary    On B complex Check B12      Vitamin D deficiency    On Vit D      Essential hypertension     Continue with Metoprolol, Spironolactone Labs today      Relevant Orders   CBC with Differential/Platelet   Comprehensive metabolic panel   GERD    Zantac bid      Relevant Orders   CBC with Differential/Platelet   Comprehensive metabolic panel      No orders of the defined types were placed in this encounter.     Follow-up: No follow-ups on file.  Sonda Primes, MD

## 2023-03-01 NOTE — Assessment & Plan Note (Signed)
Zantac bid 

## 2023-03-01 NOTE — Assessment & Plan Note (Signed)
On Vit D 

## 2023-04-28 ENCOUNTER — Ambulatory Visit
Admission: EM | Admit: 2023-04-28 | Discharge: 2023-04-28 | Disposition: A | Discharge status: Home / Self Care | Payer: Medicare Other | Attending: Physician Assistant | Admitting: Physician Assistant

## 2023-04-28 DIAGNOSIS — L299 Pruritus, unspecified: Secondary | ICD-10-CM | POA: Diagnosis not present

## 2023-04-28 MED ORDER — TRIAMCINOLONE ACETONIDE 0.1 % EX CREA: 1.0000 | TOPICAL_CREAM | Freq: Two times a day (BID) | CUTANEOUS | 0 refills | Status: DC

## 2023-04-28 NOTE — ED Provider Notes (Signed)
[facility]    CSN: [id number] Arrival date & time: 04/28/23  1243      History   Chief Complaint Chief Complaint  Patient presents with   Pruritis    HPI Jasmin [name] is a 89 y.o. female.   Patient here today for evaluation of itching to her left foot that started last night.  She states that itching is present to her left lateral first MTP.  There is no apparent rash.  She has been using topical diphenhydramine without resolution but states it is somewhat helpful.  She denies any other rash, shortness of breath, fever or other symptoms.  The history is provided by the patient.    Past Medical History:  Diagnosis Date   Contact dermatitis and other eczema, due to unspecified cause    Diverticulosis of colon    Esophageal stricture    GERD with stricture    Dr Marina Goodell   Gout    Hemorrhoids    Hiatal hernia    Hyperlipidemia    Hypertension    Onychomycosis    Personal history of urinary calculi    Polycystic kidney    Tubulovillous adenoma polyp of colon    Unspecified vitamin D deficiency    Vitamin B12 deficiency 2009   Borderline    Patient Active Problem List   Diagnosis Date Noted   Blister of lip with infection 10/18/2022   Epistaxis 07/29/2021   Hyperglycemia 01/27/2021   Aortic atherosclerosis (HCC) 11/10/2020   Nocturia 01/28/2020   RLQ abdominal pain 06/07/2019   Bilateral primary osteoarthritis of knee 09/13/2018   Constipation 07/06/2018   Peripheral neuropathic pain 12/28/2017   Gout 08/30/2017   History of colonic polyps 08/13/2016   Lipoma of arm 09/30/2014   WBC decreased 05/22/2013   Low back pain 11/16/2012   Obesity 05/31/2012   Dyspnea 03/10/2012   Edema 03/10/2012   Memory problem 11/24/2011   Internal hemorrhoid 05/20/2011   Well adult exam 11/17/2010   ONYCHOMYCOSIS 06/10/2009   DYSPHAGIA 09/10/2008   B12 deficiency 04/15/2008   ESOPHAGEAL STRICTURE 12/05/2007   Hiatal hernia 12/05/2007   FRACTURE, RIGHT  LEG 12/05/2007   NEPHROLITHIASIS, HX OF 12/05/2007   PARESTHESIA 09/22/2007   GERD 08/09/2007   Vitamin D deficiency 03/20/2007   Dyslipidemia 03/20/2007   Essential hypertension 03/20/2007   Diverticulosis of colon 03/20/2007    Past Surgical History:  Procedure Laterality Date   CATARACT EXTRACTION Bilateral 08/24/2015   COLONOSCOPY     DILATION AND CURETTAGE OF UTERUS  08/24/1975   TOE SURGERY  08/23/2010   Right foot    TONSILLECTOMY     as child    OB History   No obstetric history on file.      Home Medications    Prior to Admission medications   Medication Sig Start Date End Date Taking? Authorizing Provider  triamcinolone cream (KENALOG) 0.1 % Apply 1 Application topically 2 (two) times daily. 04/28/23  Yes Tomi Bamberger, PA-C  acetaminophen (TYLENOL) 500 MG tablet Take 1 tablet (500 mg total) by mouth every 6 (six) hours as needed. Patient taking differently: Take 1,000 mg by mouth every 6 (six) hours as needed for moderate pain or headache. 07/25/20   Linus Mako B, NP  colchicine 0.6 MG tablet Take two tablets prn gout attack. Then take another one in 1-2 hrs. Do not repeat for 3 days. 05/19/22   Plotnikov, Georgina Quint, MD  famotidine (PEPCID) 40 MG tablet TAKE 1 TABLET BY  MOUTH DAILY 08/23/22   Plotnikov, Georgina Quint, MD  lovastatin (MEVACOR) 40 MG tablet TAKE 1 TABLET BY MOUTH AT  BEDTIME 12/26/22   Plotnikov, Georgina Quint, MD  metoprolol tartrate (LOPRESSOR) 50 MG tablet TAKE 1 TABLET BY MOUTH TWICE  DAILY 12/26/22   Plotnikov, Georgina Quint, MD  mupirocin ointment (BACTROBAN) 2 % Apply 1 Application topically 2 (two) times daily. 10/22/22   Garrison, [place] N, FNP  polyethylene glycol powder (GLYCOLAX/MIRALAX) powder Take 17 g by mouth 2 (two) times daily as needed for mild constipation, moderate constipation or severe constipation. Patient taking differently: Take 17 g by mouth as needed for mild constipation, moderate constipation or severe constipation. 07/06/18   Plotnikov,  Georgina Quint, MD  spironolactone (ALDACTONE) 50 MG tablet TAKE 1 TABLET BY MOUTH DAILY 08/23/22   Plotnikov, Georgina Quint, MD  triamcinolone ointment (KENALOG) 0.5 % Apply 1 Application topically 4 (four) times daily. On dry lips prn 10/18/22 10/18/23  Plotnikov, Georgina Quint, MD    Family History Family History  Problem Relation Age of Onset   Hypertension Father    Colon cancer Neg Hx    Esophageal cancer Neg Hx    Rectal cancer Neg Hx    Stomach cancer Neg Hx     Social History Social History   Tobacco Use   Smoking status: Never   Smokeless tobacco: Never  Vaping Use   Vaping status: Never Used  Substance Use Topics   Alcohol use: No    Alcohol/week: 0.0 standard drinks of alcohol   Drug use: No     Allergies   Allopurinol, Doxycycline, Hctz [hydrochlorothiazide], Omeprazole, Oxybutynin, Sulfonamide derivatives, Clarithromycin, and Penicillins   Review of Systems Review of Systems  Constitutional:  Negative for chills and fever.  Eyes:  Negative for discharge and redness.  Gastrointestinal:  Negative for abdominal pain, nausea and vomiting.  Skin:  Positive for color change. Negative for rash.  Neurological:  Negative for numbness.     Physical Exam Triage Vital Signs ED Triage Vitals  Encounter Vitals Group     BP 04/28/23 1257 130/74     Systolic BP Percentile --      Diastolic BP Percentile --      Pulse Rate 04/28/23 1257 62     Resp 04/28/23 1257 16     Temp 04/28/23 1257 98 F (36.7 C)     Temp Source 04/28/23 1257 Oral     SpO2 04/28/23 1257 94 %     Weight --      Height --      Head Circumference --      Peak Flow --      Pain Score 04/28/23 1259 0     Pain Loc --      Pain Education --      Exclude from Growth Chart --    No data found.  Updated Vital Signs BP 130/74 (BP Location: Left Arm)   Pulse 62   Temp 98 F (36.7 C) (Oral)   Resp 16   SpO2 94%      Physical Exam Vitals and nursing note reviewed.  Constitutional:      General: She  is not in acute distress.    Appearance: Normal appearance. She is not ill-appearing.  HENT:     Head: Normocephalic and atraumatic.  Eyes:     Conjunctiva/sclera: Conjunctivae normal.  Cardiovascular:     Rate and Rhythm: Normal rate.  Pulmonary:     Effort: Pulmonary effort is  normal. No respiratory distress.  Skin:    Capillary Refill: Normal cap refill to left great toe    Comments: Mild erythema noted to lateral left first MTP with no rash appreciated  Neurological:     Mental Status: She is alert.     Comments: Gross sensation intact to left great toe distally  Psychiatric:        Mood and Affect: Mood normal.        Behavior: Behavior normal.        Thought Content: Thought content normal.      UC Treatments / Results  Labs (all labs ordered are listed, but only abnormal results are displayed) Labs Reviewed - No data to display  EKG   Radiology No results found.  Procedures Procedures (including critical care time)  Medications Ordered in UC Medications - No data to display  Initial Impression / Assessment and Plan / UC Course  I have reviewed the triage vital signs and the nursing notes.  Pertinent labs & imaging results that were available during my care of the patient were reviewed by me and considered in my medical decision making (see chart for details).    Will trial topical steroid cream and advised follow-up if no gradual improvement or with any worsening.  Discussed possibility of neuropathy as well.  Patient to follow-up with primary care provider if symptoms persist.  Final Clinical Impressions(s) / UC Diagnoses   Final diagnoses:  Pruritus   Discharge Instructions   None    ED Prescriptions     Medication Sig Dispense Auth. Provider   triamcinolone cream (KENALOG) 0.1 % Apply 1 Application topically 2 (two) times daily. 30 g Tomi Bamberger, PA-C      PDMP not reviewed this encounter.   Tomi Bamberger, PA-C 04/28/23 1435

## 2023-04-28 NOTE — ED Triage Notes (Signed)
Pt stated itching to left foot since last night. States she has been using an anti itch cream with some relief.

## 2023-04-29 ENCOUNTER — Emergency Department (HOSPITAL_COMMUNITY)
Admission: EM | Admit: 2023-04-29 | Discharge: 2023-04-29 | Disposition: A | Discharge status: Home / Self Care | Payer: Medicare Other | Attending: Emergency Medicine | Admitting: Emergency Medicine

## 2023-04-29 ENCOUNTER — Emergency Department (HOSPITAL_COMMUNITY): Payer: Medicare Other

## 2023-04-29 ENCOUNTER — Other Ambulatory Visit: Payer: Self-pay

## 2023-04-29 ENCOUNTER — Encounter (HOSPITAL_COMMUNITY): Payer: Self-pay | Admitting: Emergency Medicine

## 2023-04-29 DIAGNOSIS — M7989 Other specified soft tissue disorders: Secondary | ICD-10-CM | POA: Diagnosis not present

## 2023-04-29 DIAGNOSIS — M79672 Pain in left foot: Secondary | ICD-10-CM | POA: Diagnosis not present

## 2023-04-29 DIAGNOSIS — I10 Essential (primary) hypertension: Secondary | ICD-10-CM | POA: Diagnosis not present

## 2023-04-29 DIAGNOSIS — Z79899 Other long term (current) drug therapy: Secondary | ICD-10-CM | POA: Diagnosis not present

## 2023-04-29 NOTE — Discharge Instructions (Addendum)
You can try warm Epsom salt soaks, capsaicin cream.  Follow-up with podiatry, call in the morning to schedule an appointment.

## 2023-04-29 NOTE — ED Triage Notes (Signed)
Pt reports she was seen this morning at [facility] for her left foot itching. She was given a benadryl cream and now the itching has subsided but she is having a "shock" type pain in her foot and it is painful to bear weight. Also endorses "twitching"

## 2023-04-29 NOTE — ED Provider Notes (Signed)
[place] EMERGENCY DEPARTMENT AT [facility] Provider Note   CSN: [id number] Arrival date & time: 04/29/23  0015     History  Chief Complaint  Patient presents with   Foot Pain    Jasmin [name] is a 89 y.o. female.  89 year old female presents with complaint of 8 jumping pain in her left foot onset today near her left great toe, onset with itching.  Denies any injuries to the toe.  Is not a diabetic.  Patient went to urgent care today and was provided with steroid cream.  No history of similar symptoms previously.  No other complaints or concerns.       Home Medications Prior to Admission medications   Medication Sig Start Date End Date Taking? Authorizing Provider  acetaminophen (TYLENOL) 500 MG tablet Take 1 tablet (500 mg total) by mouth every 6 (six) hours as needed. Patient taking differently: Take 1,000 mg by mouth every 6 (six) hours as needed for moderate pain or headache. 07/25/20   Linus Mako B, NP  colchicine 0.6 MG tablet Take two tablets prn gout attack. Then take another one in 1-2 hrs. Do not repeat for 3 days. 05/19/22   Plotnikov, Georgina Quint, MD  famotidine (PEPCID) 40 MG tablet TAKE 1 TABLET BY MOUTH DAILY 08/23/22   Plotnikov, Georgina Quint, MD  lovastatin (MEVACOR) 40 MG tablet TAKE 1 TABLET BY MOUTH AT  BEDTIME 12/26/22   Plotnikov, Georgina Quint, MD  metoprolol tartrate (LOPRESSOR) 50 MG tablet TAKE 1 TABLET BY MOUTH TWICE  DAILY 12/26/22   Plotnikov, Georgina Quint, MD  mupirocin ointment (BACTROBAN) 2 % Apply 1 Application topically 2 (two) times daily. 10/22/22   Garrison, [place] N, FNP  polyethylene glycol powder (GLYCOLAX/MIRALAX) powder Take 17 g by mouth 2 (two) times daily as needed for mild constipation, moderate constipation or severe constipation. Patient taking differently: Take 17 g by mouth as needed for mild constipation, moderate constipation or severe constipation. 07/06/18   Plotnikov, Georgina Quint, MD  spironolactone (ALDACTONE) 50 MG tablet TAKE  1 TABLET BY MOUTH DAILY 08/23/22   Plotnikov, Georgina Quint, MD  triamcinolone cream (KENALOG) 0.1 % Apply 1 Application topically 2 (two) times daily. 04/28/23   Tomi Bamberger, PA-C  triamcinolone ointment (KENALOG) 0.5 % Apply 1 Application topically 4 (four) times daily. On dry lips prn 10/18/22 10/18/23  Plotnikov, Georgina Quint, MD      Allergies    Allopurinol, Doxycycline, Hctz [hydrochlorothiazide], Omeprazole, Oxybutynin, Sulfonamide derivatives, Clarithromycin, and Penicillins    Review of Systems   Review of Systems Negative except as per HPI  Physical Exam Updated Vital Signs BP (!) 127/59   Pulse (!) 57   Temp 97.7 F (36.5 C)   Resp 17   SpO2 97%  Physical Exam Vitals and nursing note reviewed.  Constitutional:      General: She is not in acute distress.    Appearance: She is well-developed. She is not diaphoretic.  HENT:     Head: Normocephalic and atraumatic.  Cardiovascular:     Pulses: Normal pulses.  Pulmonary:     Effort: Pulmonary effort is normal.  Musculoskeletal:        General: Tenderness present. No swelling, deformity or signs of injury.       Legs:  Skin:    General: Skin is warm and dry.     Findings: No bruising, erythema or rash.  Neurological:     Mental Status: She is alert and oriented to person, place,  and time.  Psychiatric:        Behavior: Behavior normal.     ED Results / Procedures / Treatments   Labs (all labs ordered are listed, but only abnormal results are displayed) Labs Reviewed - No data to display  EKG None  Radiology DG Foot Complete Left  Result Date: 04/29/2023 CLINICAL DATA:  Pain EXAM: LEFT FOOT - COMPLETE 3+ VIEW COMPARISON:  None Available. FINDINGS: Soft tissue swelling along the dorsum of the foot. No acute bony abnormality. Specifically, no fracture, subluxation, or dislocation. Joint spaces maintained. IMPRESSION: No acute bony abnormality. Electronically Signed   By: Charlett Nose M.D.   On: 04/29/2023 01:43     Procedures Procedures    Medications Ordered in ED Medications - No data to display  ED Course/ Medical Decision Making/ A&P                                 Medical Decision Making Amount and/or Complexity of Data Reviewed Radiology: ordered.   This patient presents to the ED for concern of left foot pain, this involves an extensive number of treatment options, and is a complaint that carries with it a high risk of complications and morbidity.  The differential diagnosis includes gout, neuropathy, vascular process    Co morbidities that complicate the patient evaluation  Gout, GERD, HLD, HTN, additional history on file reviewed   Additional history obtained:  Additional history obtained from husband at bedside who contributes to history as above External records from outside source obtained and reviewed including visit to UC dated 04/28/23, provided with steroid cream    Imaging Studies ordered:  I ordered imaging studies including XR Left foot  I independently visualized and interpreted imaging which showed no acute process I agree with the radiologist interpretation   Problem List / ED Course / Critical interventions / Medication management  89 year old female here with left foot pain, onset yesterday with itching, now described as "jumping." Symptoms are intermittent. DP pulse present, skin exam normal. XR without acute process. Reports does not feel like her gout. Consider possible neuropathy. Recommend warm epsom salt soaks, can try capsicin. Follow up with pcp or podiatry.  I have reviewed the patients home medicines and have made adjustments as needed   Social Determinants of Health:  Lives with spouse, has PCP   Test / Admission - Considered:  Stable for dc with referral to podiatry and plan for recheck with PCP         Final Clinical Impression(s) / ED Diagnoses Final diagnoses:  Left foot pain    Rx / DC Orders ED Discharge Orders      None         Jeannie Fend, PA-C 04/29/23 0258    Gilda Crease, MD 04/29/23 [phone]

## 2023-05-02 ENCOUNTER — Encounter: Payer: Self-pay | Admitting: Family Medicine

## 2023-05-02 ENCOUNTER — Other Ambulatory Visit: Payer: Self-pay | Admitting: Internal Medicine

## 2023-05-02 ENCOUNTER — Ambulatory Visit (INDEPENDENT_AMBULATORY_CARE_PROVIDER_SITE_OTHER): Payer: Medicare Other | Admitting: Family Medicine

## 2023-05-02 VITALS — BP 128/68 | HR 72 | Temp 97.8°F | Resp 20 | Ht 66.0 in | Wt 216.0 lb

## 2023-05-02 DIAGNOSIS — R600 Localized edema: Secondary | ICD-10-CM | POA: Diagnosis not present

## 2023-05-02 DIAGNOSIS — M79672 Pain in left foot: Secondary | ICD-10-CM | POA: Diagnosis not present

## 2023-05-02 NOTE — Patient Instructions (Signed)
Apply compression hose in the morning and remove in the evening to help with swelling.

## 2023-05-02 NOTE — Progress Notes (Signed)
Assessment & Plan:  1-2. Acute pain of left foot/Localized edema Resolved.  Encouraged application of compression hose in the mornings and removal in the evenings.  Education provided on edema.   Follow up plan: Return if symptoms worsen or fail to improve.  Jasmin Boston, MSN, APRN, FNP-C  Subjective:  HPI: Jasmin [name] is a 89 y.o. female presenting on 05/02/2023 for fool pain (Left - Went to Urgent care Thursday and ER Thursday night for this )  Patient is accompanied by her husband, who she is okay with being present.  Patient is here today due to left foot pain that started 4 days ago.  She describes the pain as stabbing.  Initially the left foot was itching and she went to an urgent care where she was prescribed triamcinolone cream.  This caused pain, so she quit using it.  That evening is when the pain got so bad that she went to the ER.  X-ray was negative.  She was advised to soak her foot with Epsom salt and try capsaicin cream which she reports was helpful after two applications.  Her pain has resolved, but she wanted to come in to make sure her PCP was aware.  She does report today that her foot was swollen when she was experiencing itching and pain.    ROS: Negative unless specifically indicated above in HPI.   Relevant past medical history reviewed and updated as indicated.   Allergies and medications reviewed and updated.   Current Outpatient Medications:    acetaminophen (TYLENOL) 500 MG tablet, Take 1 tablet (500 mg total) by mouth every 6 (six) hours as needed. (Patient taking differently: Take 1,000 mg by mouth every 6 (six) hours as needed for moderate pain or headache.), Disp: 30 tablet, Rfl: 0   Apoaequorin (PREVAGEN PO), Take 1 capsule by mouth daily. OTC, Disp: , Rfl:    colchicine 0.6 MG tablet, Take two tablets prn gout attack. Then take another one in 1-2 hrs. Do not repeat for 3 days., Disp: 18 tablet, Rfl: 1   famotidine (PEPCID) 40 MG tablet,  TAKE 1 TABLET BY MOUTH DAILY, Disp: 100 tablet, Rfl: 2   lovastatin (MEVACOR) 40 MG tablet, TAKE 1 TABLET BY MOUTH AT  BEDTIME, Disp: 100 tablet, Rfl: 2   metoprolol tartrate (LOPRESSOR) 50 MG tablet, TAKE 1 TABLET BY MOUTH TWICE  DAILY, Disp: 200 tablet, Rfl: 2   polyethylene glycol powder (GLYCOLAX/MIRALAX) powder, Take 17 g by mouth 2 (two) times daily as needed for mild constipation, moderate constipation or severe constipation. (Patient taking differently: Take 17 g by mouth as needed for mild constipation, moderate constipation or severe constipation.), Disp: 500 g, Rfl: 3   spironolactone (ALDACTONE) 50 MG tablet, TAKE 1 TABLET BY MOUTH DAILY, Disp: 100 tablet, Rfl: 2   mupirocin ointment (BACTROBAN) 2 %, Apply 1 Application topically 2 (two) times daily. (Patient not taking: Reported on 05/02/2023), Disp: 22 g, Rfl: 0   triamcinolone cream (KENALOG) 0.1 %, Apply 1 Application topically 2 (two) times daily. (Patient not taking: Reported on 05/02/2023), Disp: 30 g, Rfl: 0   triamcinolone ointment (KENALOG) 0.5 %, Apply 1 Application topically 4 (four) times daily. On dry lips prn (Patient not taking: Reported on 05/02/2023), Disp: 30 g, Rfl: 0  Allergies  Allergen Reactions   Allopurinol     Frequent urination   Doxycycline     nausea   Hctz [Hydrochlorothiazide]     gout   Omeprazole  diarrhea   Oxybutynin     Frequent urination per patient   Sulfonamide Derivatives     Per pt unknown   Clarithromycin Rash   Penicillins Rash    Did it involve swelling of the face/tongue/throat, SOB, or low BP? Yes Did it involve sudden or severe rash/hives, skin peeling, or any reaction on the inside of your mouth or nose? No Did you need to seek medical attention at a hospital or doctor's office? No When did it last happen?     childhood If all above answers are "NO", may proceed with cephalosporin use.;     Objective:   BP 128/68   Pulse 72   Temp 97.8 F (36.6 C)   Resp 20   Ht 5\' 6"   (1.676 m)   Wt 216 lb (98 kg)   BMI 34.86 kg/m    Physical Exam Vitals reviewed.  Constitutional:      General: She is not in acute distress.    Appearance: Normal appearance. She is not ill-appearing, toxic-appearing or diaphoretic.  HENT:     Head: Normocephalic and atraumatic.  Eyes:     General: No scleral icterus.       Right eye: No discharge.        Left eye: No discharge.     Conjunctiva/sclera: Conjunctivae normal.  Cardiovascular:     Rate and Rhythm: Normal rate.  Pulmonary:     Effort: Pulmonary effort is normal. No respiratory distress.  Musculoskeletal:        General: Normal range of motion.     Cervical back: Normal range of motion.     Left lower leg: 2+ Pitting Edema present.     Left foot: Normal.  Skin:    General: Skin is warm and dry.     Capillary Refill: Capillary refill takes less than 2 seconds.  Neurological:     General: No focal deficit present.     Mental Status: She is alert and oriented to person, place, and time. Mental status is at baseline.  Psychiatric:        Mood and Affect: Mood normal.        Behavior: Behavior normal.        Thought Content: Thought content normal.        Judgment: Judgment normal.

## 2023-05-05 ENCOUNTER — Encounter: Payer: Self-pay | Admitting: Podiatry

## 2023-05-05 ENCOUNTER — Ambulatory Visit: Payer: Medicare Other | Admitting: Podiatry

## 2023-05-05 DIAGNOSIS — G5793 Unspecified mononeuropathy of bilateral lower limbs: Secondary | ICD-10-CM

## 2023-05-05 MED ORDER — GABAPENTIN 100 MG PO CAPS: 100.0000 mg | ORAL_CAPSULE | Freq: Every day | ORAL | 3 refills | Status: DC

## 2023-05-08 NOTE — Progress Notes (Signed)
Subjective:  Patient ID: KARESA SITZER, female    DOB: 21-Nov-1933,  MRN: [id number] HPI Chief Complaint  Patient presents with   Foot Pain    1st MPJ and medial side left - woke up last Thurs with a lot of itching, then foot got swollen and red, real painful, went to ED-xrayed-Rx;d capcasin cream, wasn't aware they gave cream at the time, went to PCP next day to evaluated-realized had Rx-started using and the pain went away   New Patient (Initial Visit)    Est pt 2020    89 y.o. female presents with the above complaint.   ROS: Denies fever chills nausea vomit muscle aches pains calf pain back pain chest pain shortness of breath.  Past Medical History:  Diagnosis Date   Contact dermatitis and other eczema, due to unspecified cause    Diverticulosis of colon    Esophageal stricture    GERD with stricture    Dr Marina Goodell   Gout    Hemorrhoids    Hiatal hernia    Hyperlipidemia    Hypertension    Onychomycosis    Personal history of urinary calculi    Polycystic kidney    Tubulovillous adenoma polyp of colon    Unspecified vitamin D deficiency    Vitamin B12 deficiency 2009   Borderline   Past Surgical History:  Procedure Laterality Date   CATARACT EXTRACTION Bilateral 08/24/2015   COLONOSCOPY     DILATION AND CURETTAGE OF UTERUS  08/24/1975   TOE SURGERY  08/23/2010   Right foot    TONSILLECTOMY     as child    Current Outpatient Medications:    gabapentin (NEURONTIN) 100 MG capsule, Take 1 capsule (100 mg total) by mouth at bedtime., Disp: 90 capsule, Rfl: 3   acetaminophen (TYLENOL) 500 MG tablet, Take 1 tablet (500 mg total) by mouth every 6 (six) hours as needed. (Patient taking differently: Take 1,000 mg by mouth every 6 (six) hours as needed for moderate pain or headache.), Disp: 30 tablet, Rfl: 0   Apoaequorin (PREVAGEN PO), Take 1 capsule by mouth daily. OTC, Disp: , Rfl:    colchicine 0.6 MG tablet, Take two tablets prn gout attack. Then take another one in  1-2 hrs. Do not repeat for 3 days., Disp: 18 tablet, Rfl: 1   famotidine (PEPCID) 40 MG tablet, TAKE 1 TABLET BY MOUTH DAILY, Disp: 100 tablet, Rfl: 2   lovastatin (MEVACOR) 40 MG tablet, TAKE 1 TABLET BY MOUTH AT  BEDTIME, Disp: 100 tablet, Rfl: 2   metoprolol tartrate (LOPRESSOR) 50 MG tablet, TAKE 1 TABLET BY MOUTH TWICE  DAILY, Disp: 200 tablet, Rfl: 2   mupirocin ointment (BACTROBAN) 2 %, Apply 1 Application topically 2 (two) times daily. (Patient not taking: Reported on 05/02/2023), Disp: 22 g, Rfl: 0   polyethylene glycol powder (GLYCOLAX/MIRALAX) powder, Take 17 g by mouth 2 (two) times daily as needed for mild constipation, moderate constipation or severe constipation. (Patient taking differently: Take 17 g by mouth as needed for mild constipation, moderate constipation or severe constipation.), Disp: 500 g, Rfl: 3   spironolactone (ALDACTONE) 50 MG tablet, TAKE 1 TABLET BY MOUTH DAILY, Disp: 100 tablet, Rfl: 2   triamcinolone cream (KENALOG) 0.1 %, Apply 1 Application topically 2 (two) times daily. (Patient not taking: Reported on 05/02/2023), Disp: 30 g, Rfl: 0   triamcinolone ointment (KENALOG) 0.5 %, Apply 1 Application topically 4 (four) times daily. On dry lips prn (Patient not taking: Reported on 05/02/2023),  Disp: 30 g, Rfl: 0  Allergies  Allergen Reactions   Allopurinol     Frequent urination   Doxycycline     nausea   Hctz [Hydrochlorothiazide]     gout   Omeprazole     diarrhea   Oxybutynin     Frequent urination per patient   Sulfonamide Derivatives     Per pt unknown   Clarithromycin Rash   Penicillins Rash    Did it involve swelling of the face/tongue/throat, SOB, or low BP? Yes Did it involve sudden or severe rash/hives, skin peeling, or any reaction on the inside of your mouth or nose? No Did you need to seek medical attention at a hospital or doctor's office? No When did it last happen?     childhood If all above answers are "NO", may proceed with cephalosporin  use.;    Review of Systems Objective:  There were no vitals filed for this visit.  General: Well developed, nourished, in no acute distress, alert and oriented x3   Dermatological: Skin is warm, dry and supple bilateral. Nails x 10 are well maintained; remaining integument appears unremarkable at this time. There are no open sores, no preulcerative lesions, no rash or signs of infection present.  Vascular: Dorsalis Pedis artery and Posterior Tibial artery pedal pulses are 2/4 bilateral with immedate capillary fill time. Pedal hair growth present. No varicosities and no lower extremity edema present bilateral.   Neruologic: Grossly intact via light touch bilateral. Vibratory intact via tuning fork bilateral. Protective threshold with Semmes Wienstein monofilament intact to all pedal sites bilateral. Patellar and Achilles deep tendon reflexes 2+ bilateral. No Babinski or clonus noted bilateral.   Musculoskeletal: No gross boney pedal deformities bilateral. No pain, crepitus, or limitation noted with foot and ankle range of motion bilateral. Muscular strength 5/5 in all groups tested bilateral.  Gait: Unassisted, Nonantalgic.    Radiographs:  None taken.  Assessment & Plan:   Assessment: Neuropathy  Plan: Continue capsaicin cream and restart her gabapentin.     Kaven Cumbie T. [place], [place]

## 2023-08-03 DIAGNOSIS — R350 Frequency of micturition: Secondary | ICD-10-CM | POA: Diagnosis not present

## 2023-08-03 DIAGNOSIS — N390 Urinary tract infection, site not specified: Secondary | ICD-10-CM | POA: Diagnosis not present

## 2023-08-04 ENCOUNTER — Encounter: Payer: Self-pay | Admitting: Podiatry

## 2023-08-04 ENCOUNTER — Ambulatory Visit: Payer: Medicare Other | Admitting: Podiatry

## 2023-08-04 DIAGNOSIS — G5793 Unspecified mononeuropathy of bilateral lower limbs: Secondary | ICD-10-CM | POA: Diagnosis not present

## 2023-08-04 NOTE — Progress Notes (Signed)
She presents today for follow-up of her neuropathy states that she cut back on her gabapentin was taken at nighttime and it has been doing perfectly well ever since.  She states that she has had a gout attack since she saw me last but went to the hospital she was given medications and is now over that.  Objective: Vital signs stable oriented x 3 there is no erythema edema salines drainage odor she has no change in her physical exam.  No open lesions or wounds.  Assessment: Well-controlled neuropathy with gabapentin 100 mg 1 p.o. nightly.  She still has 3 refills left on her bottle.  Plan: She will continue her gabapentin will follow-up with me in 6 months for diabetic check.

## 2023-08-15 ENCOUNTER — Ambulatory Visit (INDEPENDENT_AMBULATORY_CARE_PROVIDER_SITE_OTHER): Payer: Medicare Other

## 2023-08-15 VITALS — BP 130/70 | HR 55 | Ht 66.0 in | Wt 220.5 lb

## 2023-08-15 DIAGNOSIS — Z0000 Encounter for general adult medical examination without abnormal findings: Secondary | ICD-10-CM | POA: Diagnosis not present

## 2023-08-15 NOTE — Progress Notes (Signed)
Subjective:   Jasmin [name] is a 89 y.o. female who presents for Medicare Annual (Subsequent) preventive examination.  Visit Complete: In person   Cardiac Risk Factors include: advanced age (>73men, >30 women);hypertension;dyslipidemia;obesity (BMI >30kg/m2);Other (see comment), Risk factor comments: Aortic atherosclerosis     Objective:    Today's Vitals   08/15/23 1004  BP: 130/70  Pulse: (!) 55  Weight: 220 lb 8 oz (100 kg)  Height: 5\' 6"  (1.676 m)   Body mass index is 35.59 kg/m.     08/15/2023   10:09 AM 04/29/2023   12:27 AM 08/30/2022    8:51 AM 07/05/2022   11:35 AM 04/14/2022    4:59 AM 04/12/2022    1:36 AM 02/14/2022    8:32 AM  Advanced Directives  Does Patient Have a Medical Advance Directive? Yes No No Yes No No No  Type of Estate agent of [place];Living will   Living will     Does patient want to make changes to medical advance directive?    No - Patient declined   Yes (ED - Information included in AVS)  Copy of Healthcare Power of Attorney in Chart? No - copy requested        Would patient like information on creating a medical advance directive?      No - Patient declined Yes (ED - Information included in AVS)    Current Medications (verified) Outpatient Encounter Medications as of 08/15/2023  Medication Sig   acetaminophen (TYLENOL) 500 MG tablet Take 1 tablet (500 mg total) by mouth every 6 (six) hours as needed. (Patient taking differently: Take 1,000 mg by mouth every 6 (six) hours as needed for moderate pain (pain score 4-6) or headache.)   famotidine (PEPCID) 40 MG tablet TAKE 1 TABLET BY MOUTH DAILY   gabapentin (NEURONTIN) 100 MG capsule Take 1 capsule (100 mg total) by mouth at bedtime.   lovastatin (MEVACOR) 40 MG tablet TAKE 1 TABLET BY MOUTH AT  BEDTIME   metoprolol tartrate (LOPRESSOR) 50 MG tablet TAKE 1 TABLET BY MOUTH TWICE  DAILY   mupirocin ointment (BACTROBAN) 2 % Apply 1 Application topically 2 (two) times  daily.   nitrofurantoin, macrocrystal-monohydrate, (MACROBID) 100 MG capsule Take 100 mg by mouth 2 (two) times daily. Only for 10 days   polyethylene glycol powder (GLYCOLAX/MIRALAX) powder Take 17 g by mouth 2 (two) times daily as needed for mild constipation, moderate constipation or severe constipation. (Patient taking differently: Take 17 g by mouth as needed for mild constipation, moderate constipation or severe constipation.)   Apoaequorin (PREVAGEN PO) Take 1 capsule by mouth daily. OTC (Patient not taking: Reported on 08/15/2023)   colchicine 0.6 MG tablet Take two tablets prn gout attack. Then take another one in 1-2 hrs. Do not repeat for 3 days. (Patient not taking: Reported on 08/15/2023)   spironolactone (ALDACTONE) 50 MG tablet TAKE 1 TABLET BY MOUTH DAILY (Patient not taking: Reported on 08/15/2023)   triamcinolone cream (KENALOG) 0.1 % Apply 1 Application topically 2 (two) times daily. (Patient not taking: Reported on 08/15/2023)   triamcinolone ointment (KENALOG) 0.5 % Apply 1 Application topically 4 (four) times daily. On dry lips prn (Patient not taking: Reported on 08/15/2023)   No facility-administered encounter medications on file as of 08/15/2023.    Allergies (verified) Allopurinol, Doxycycline, Hctz [hydrochlorothiazide], Omeprazole, Oxybutynin, Sulfonamide derivatives, Clarithromycin, and Penicillins   History: Past Medical History:  Diagnosis Date   Contact dermatitis and other eczema, due to unspecified cause  Diverticulosis of colon    Esophageal stricture    GERD with stricture    Dr Marina Goodell   Gout    Hemorrhoids    Hiatal hernia    Hyperlipidemia    Hypertension    Onychomycosis    Personal history of urinary calculi    Polycystic kidney    Tubulovillous adenoma polyp of colon    Unspecified vitamin D deficiency    Vitamin B12 deficiency 2009   Borderline   Past Surgical History:  Procedure Laterality Date   CATARACT EXTRACTION Bilateral  08/24/2015   COLONOSCOPY     DILATION AND CURETTAGE OF UTERUS  08/24/1975   TOE SURGERY  08/23/2010   Right foot    TONSILLECTOMY     as child   Family History  Problem Relation Age of Onset   Hypertension Father    Colon cancer Neg Hx    Esophageal cancer Neg Hx    Rectal cancer Neg Hx    Stomach cancer Neg Hx    Social History   Socioeconomic History   Marital status: Married    Spouse name: Eddie   Number of children: 2   Years of education: Not on file   Highest education level: Not on file  Occupational History   Occupation: RETIRED  Tobacco Use   Smoking status: Never   Smokeless tobacco: Never  Vaping Use   Vaping status: Never Used  Substance and Sexual Activity   Alcohol use: No    Alcohol/week: 0.0 standard drinks of alcohol   Drug use: No   Sexual activity: Not Currently  Other Topics Concern   Not on file  Social History Narrative   GYN: Dr Henderson Cloud      FAMILY HISTORY HYPERTENSION      Regular exercise - NO   Lives with husband   Social Drivers of Health   Financial Resource Strain: Low Risk  (08/15/2023)   Overall Financial Resource Strain (CARDIA)    Difficulty of Paying Living Expenses: Not hard at all  Food Insecurity: No Food Insecurity (08/15/2023)   Hunger Vital Sign    Worried About Running Out of Food in the Last Year: Never true    Ran Out of Food in the Last Year: Never true  Transportation Needs: No Transportation Needs (08/15/2023)   PRAPARE - Administrator, Civil Service (Medical): No    Lack of Transportation (Non-Medical): No  Physical Activity: Sufficiently Active (08/15/2023)   Exercise Vital Sign    Days of Exercise per Week: 3 days    Minutes of Exercise per Session: 60 min  Stress: No Stress Concern Present (08/15/2023)   [employer] of Occupational Health - Occupational Stress Questionnaire    Feeling of Stress : Not at all  Social Connections: Socially Integrated (08/15/2023)   Social Connection  and Isolation Panel [NHANES]    Frequency of Communication with Friends and Family: Three times a week    Frequency of Social Gatherings with Friends and Family: Never    Attends Religious Services: More than 4 times per year    Active Member of [employer] or Organizations: Yes    Attends Banker Meetings: Never    Marital Status: Married    Tobacco Counseling Counseling given: Not Answered   Clinical Intake:  Pre-visit preparation completed: Yes  Pain : No/denies pain     BMI - recorded: 35.59 Nutritional Status: BMI > 30  Obese Nutritional Risks: None Diabetes: No  How often do  you need to have someone help you when you read instructions, pamphlets, or other written materials from your doctor or pharmacy?: 1 - Never  Interpreter Needed?: No  Information entered by :: Tiphanie Vo, RMA   Activities of Daily Living    08/15/2023    8:21 AM  In your present state of health, do you have any difficulty performing the following activities:  Hearing? 0  Vision? 0  Difficulty concentrating or making decisions? 0  Walking or climbing stairs? 0  Dressing or bathing? 0  Doing errands, shopping? 0  Preparing Food and eating ? N  Using the Toilet? N  In the past six months, have you accidently leaked urine? N  Do you have problems with loss of bowel control? N  Managing your Medications? N  Managing your Finances? N  Housekeeping or managing your Housekeeping? N    Patient Care Team: Plotnikov, Georgina Quint, MD as PCP - Angelique Holm, Wilhemina Bonito, MD (Gastroenterology) Myna Hidalgo, DO as Consulting Physician (Obstetrics and Gynecology) Tarry Kos, MD as Attending Physician (Orthopedic Surgery) [facility]  Indicate any recent Medical Services you may have received from other than [facility] providers in the past year (date may be approximate).     Assessment:   This is a routine wellness examination for Jasmin [name].  Hearing/Vision screen Hearing  Screening - Comments:: Denies hearing difficulties   Vision Screening - Comments:: Wears eyeglasses   Goals Addressed   None   Depression Screen    08/15/2023    8:35 AM 05/02/2023    8:09 AM 03/01/2023    8:01 AM 11/01/2022    9:21 AM 10/18/2022    3:17 PM 08/26/2022    8:55 AM 07/05/2022   11:33 AM  PHQ 2/9 Scores  PHQ - 2 Score 3 1 0 0 0 0 0  PHQ- 9 Score 4   0 0 0     Fall Risk    08/15/2023    8:27 AM 05/02/2023    8:08 AM 03/01/2023    8:01 AM 11/01/2022    9:20 AM 10/18/2022    3:17 PM  Fall Risk   Falls in the past year? 0 0 0 0 0  Number falls in past yr: 0 0 0 0 0  Injury with Fall? 0 0 0 0 0  Risk for fall due to : No Fall Risks No Fall Risks No Fall Risks No Fall Risks No Fall Risks  Follow up Falls prevention discussed;Falls evaluation completed Falls evaluation completed Falls evaluation completed Falls evaluation completed Falls evaluation completed    MEDICARE RISK AT HOME: Medicare Risk at Home Any stairs in or around the home?: Yes If so, are there any without handrails?: Yes Home free of loose throw rugs in walkways, pet beds, electrical cords, etc?: Yes Adequate lighting in your home to reduce risk of falls?: Yes Life alert?: No Use of a cane, walker or w/c?: No Grab bars in the bathroom?: No Shower chair or bench in shower?: Yes Elevated toilet seat or a handicapped toilet?: Yes  TIMED UP AND GO:  Was the test performed?  Yes  Length of time to ambulate 10 feet: 15 sec Gait steady and fast without use of assistive device    Cognitive Function:    03/02/2018    9:03 AM  MMSE - Mini Mental State Exam  Orientation to time 5  Orientation to Place 5  Registration 3  Attention/ Calculation 3  Recall 3  Language- name 2 objects 2  Language- repeat 1  Language- follow 3 step command 3  Language- read & follow direction 1  Write a sentence 1  Copy design 1  Total score 28        08/15/2023    8:11 AM 07/05/2022   11:38 AM 03/13/2020   12:12 PM   6CIT Screen  What Year? 0 points 0 points 0 points  What month? 0 points 0 points 0 points  What time? 0 points 0 points 0 points  Count back from 20 2 points 0 points 0 points  Months in reverse 4 points 0 points 0 points  Repeat phrase 8 points 0 points 0 points  Total Score 14 points 0 points 0 points    Immunizations Immunization History  Administered Date(s) Administered   Fluad Quad(high Dose 65+) 04/11/2019, 07/29/2020, 05/04/2022   Influenza Split 05/20/2011, 05/31/2012   Influenza Whole 08/24/2005, 06/21/2008, 06/10/2009   Influenza, High Dose Seasonal PF 06/15/2017, 06/02/2018, 06/05/2021   Influenza,inj,Quad PF,6+ Mos 05/22/2013, 05/29/2014, 06/04/2015, 04/16/2016   Moderna Covid-19 Vaccine Bivalent Booster 32yrs & up 06/16/2022   PFIZER(Purple Top)SARS-COV-2 Vaccination 09/02/2019, 10/13/2019, 05/19/2020   PNEUMOCOCCAL CONJUGATE-20 08/26/2022   Pfizer(Comirnaty)Fall Seasonal Vaccine 12 years and older 04/21/2023   Pneumococcal Conjugate-13 09/21/2013   Pneumococcal Polysaccharide-23 08/25/2004, 11/12/2009, 05/31/2012   Td 03/02/2013   Zoster Recombinant(Shingrix) 03/02/2017, 06/13/2017    TDAP status: Due, Education has been provided regarding the importance of this vaccine. Advised may receive this vaccine at local pharmacy or Health Dept. Aware to provide a copy of the vaccination record if obtained from local pharmacy or Health Dept. Verbalized acceptance and understanding.  Flu Vaccine status: Up to date  Pneumococcal vaccine status: Up to date  Covid-19 vaccine status: Completed vaccines  Qualifies for Shingles Vaccine? Yes   Zostavax completed Yes   Shingrix Completed?: Yes  Screening Tests Health Maintenance  Topic Date Due   DTaP/Tdap/Td (2 - Tdap) 11/21/2023 (Originally 03/03/2023)   INFLUENZA VACCINE  11/21/2023 (Originally 03/24/2023)   Medicare Annual Wellness (AWV)  08/14/2024   Pneumonia Vaccine 6+ Years old  Completed   DEXA SCAN  Completed    COVID-19 Vaccine  Completed   Zoster Vaccines- Shingrix  Completed   HPV VACCINES  Aged Out   Colonoscopy  Discontinued    Health Maintenance  There are no preventive care reminders to display for this patient.   Colorectal cancer screening: No longer required.   Mammogram status: No longer required due to age.  Bone Density status: Completed 2018. Results reflect: Bone density results: NORMAL. Repeat every 2 years.  Lung Cancer Screening: (Low Dose CT Chest recommended if Age 51-80 years, 20 pack-year currently smoking OR have quit w/in 15years.) does not qualify.   Lung Cancer Screening Referral: N/A  Additional Screening:  Hepatitis C Screening: does not qualify; Completed N/A  Vision Screening: Recommended annual ophthalmology exams for early detection of glaucoma and other disorders of the eye. Is the patient up to date with their annual eye exam?  Yes  Who is the provider or what is the name of the office in which the patient attends annual eye exams? Dr. Dione Booze If pt is not established with a provider, would they like to be referred to a provider to establish care? No .   Dental Screening: Recommended annual dental exams for proper oral hygiene    Community Resource Referral / Chronic Care Management: CRR required this visit?  No   CCM required this visit?  No     Plan:     I have personally reviewed and noted the following in the patient's chart:   Medical and social history Use of alcohol, tobacco or illicit drugs  Current medications and supplements including opioid prescriptions. Patient is not currently taking opioid prescriptions. Functional ability and status Nutritional status Physical activity Advanced directives List of other physicians Hospitalizations, surgeries, and ER visits in previous 12 months Vitals Screenings to include cognitive, depression, and falls Referrals and appointments  In addition, I have reviewed and discussed with  patient certain preventive protocols, quality metrics, and best practice recommendations. A written personalized care plan for preventive services as well as general preventive health recommendations were provided to patient.     Haly Feher L Kenna Seward, CMA   08/15/2023   After Visit Summary: (Mail) Due to this being a telephonic visit, the after visit summary with patients personalized plan was offered to patient via mail   Nurse Notes: Patient is due for a Tdap.  She is due for a DEXA, however she declines at this time.  All other health maintenance is up to date.  She had no other concerns to address today.

## 2023-08-15 NOTE — Patient Instructions (Signed)
Jasmin [name] , Thank you for taking time to come for your Medicare Wellness Visit. I appreciate your ongoing commitment to your health goals. Please review the following plan we discussed and let me know if I can assist you in the future.   Referrals/Orders/Follow-Ups/Clinician Recommendations: You are due for a Tetanus vaccine, which you can get this done at your local pharmacy.  It was a pleasure to meet you today.  Wishing you happy holidays with your family.  Keep up the good work.  This is a list of the screening recommended for you and due dates:  Health Maintenance  Topic Date Due   DTaP/Tdap/Td vaccine (2 - Tdap) 11/21/2023*   Flu Shot  11/21/2023*   Medicare Annual Wellness Visit  08/14/2024   Pneumonia Vaccine  Completed   DEXA scan (bone density measurement)  Completed   COVID-19 Vaccine  Completed   Zoster (Shingles) Vaccine  Completed   HPV Vaccine  Aged Out   Colon Cancer Screening  Discontinued  *Topic was postponed. The date shown is not the original due date.    Advanced directives: (Copy Requested) Please bring a copy of your health care power of attorney and living will to the office to be added to your chart at your convenience.  Next Medicare Annual Wellness Visit scheduled for next year: Yes

## 2023-09-01 ENCOUNTER — Encounter: Payer: Self-pay | Admitting: Internal Medicine

## 2023-09-01 ENCOUNTER — Ambulatory Visit (INDEPENDENT_AMBULATORY_CARE_PROVIDER_SITE_OTHER): Payer: Medicare Other | Admitting: Internal Medicine

## 2023-09-01 VITALS — BP 128/72 | HR 59 | Temp 98.8°F | Ht 66.0 in | Wt 222.2 lb

## 2023-09-01 DIAGNOSIS — E785 Hyperlipidemia, unspecified: Secondary | ICD-10-CM

## 2023-09-01 DIAGNOSIS — E538 Deficiency of other specified B group vitamins: Secondary | ICD-10-CM

## 2023-09-01 DIAGNOSIS — M1000 Idiopathic gout, unspecified site: Secondary | ICD-10-CM

## 2023-09-01 DIAGNOSIS — I10 Essential (primary) hypertension: Secondary | ICD-10-CM | POA: Diagnosis not present

## 2023-09-01 LAB — LIPID PANEL
Cholesterol: 177 mg/dL (ref 0–200)
HDL: 60.3 mg/dL (ref >39.00)
LDL Cholesterol: 93 mg/dL (ref 0–99)
NonHDL: 117.1
Total CHOL/HDL Ratio: 3
Triglycerides: 123 mg/dL (ref 0.0–149.0)
VLDL: 24.6 mg/dL (ref 0.0–40.0)

## 2023-09-01 LAB — COMPREHENSIVE METABOLIC PANEL
ALT: 12 U/L (ref 0–35)
AST: 16 U/L (ref 0–37)
Albumin: 4.3 g/dL (ref 3.5–5.2)
Alkaline Phosphatase: 52 U/L (ref 39–117)
BUN: 19 mg/dL (ref 6–23)
CO2: 28 meq/L (ref 19–32)
Calcium: 9.7 mg/dL (ref 8.4–10.5)
Chloride: 105 meq/L (ref 96–112)
Creatinine, Ser: 1.17 mg/dL (ref 0.40–1.20)
GFR: 41.89 mL/min — ABNORMAL LOW (ref >60.00)
Glucose, Bld: 91 mg/dL (ref 70–99)
Potassium: 4.6 meq/L (ref 3.5–5.1)
Sodium: 141 meq/L (ref 135–145)
Total Bilirubin: 0.6 mg/dL (ref 0.2–1.2)
Total Protein: 7.1 g/dL (ref 6.0–8.3)

## 2023-09-01 LAB — URIC ACID: Uric Acid, Serum: 5.9 mg/dL (ref 2.4–7.0)

## 2023-09-01 LAB — TSH: TSH: 1.7 u[IU]/mL (ref 0.35–5.50)

## 2023-09-01 MED ORDER — METHYLPREDNISOLONE 4 MG PO TBPK: ORAL_TABLET | ORAL | 0 refills | Status: DC

## 2023-09-01 MED ORDER — COLCHICINE 0.6 MG PO TABS: ORAL_TABLET | ORAL | 1 refills | Status: DC

## 2023-09-01 NOTE — Assessment & Plan Note (Signed)
Colchicine prn Medrol pack prn Allopurinol intolerant

## 2023-09-01 NOTE — Assessment & Plan Note (Signed)
Continue with Metoprolol, Spironolactone Labs today

## 2023-09-01 NOTE — Progress Notes (Signed)
 Subjective:  Patient ID: Jasmin [name], female    DOB: [birth year]  Age: 89 y.o. MRN: [id number]  CC: Medical Management of Chronic Issues (6 Month follow up)   HPI Danett L Stearns presents for GERD, dyslipidemia, HTN, gout  Outpatient Medications Prior to Visit  Medication Sig Dispense Refill   famotidine  (PEPCID ) 40 MG tablet TAKE 1 TABLET BY MOUTH DAILY 100 tablet 2   gabapentin  (NEURONTIN ) 100 MG capsule Take 1 capsule (100 mg total) by mouth at bedtime. 90 capsule 3   lovastatin  (MEVACOR ) 40 MG tablet TAKE 1 TABLET BY MOUTH AT  BEDTIME 100 tablet 2   metoprolol  tartrate (LOPRESSOR ) 50 MG tablet TAKE 1 TABLET BY MOUTH TWICE  DAILY 200 tablet 2   mupirocin  ointment (BACTROBAN ) 2 % Apply 1 Application topically 2 (two) times daily. 22 g 0   polyethylene glycol powder (GLYCOLAX /MIRALAX ) powder Take 17 g by mouth 2 (two) times daily as needed for mild constipation, moderate constipation or severe constipation. (Patient taking differently: Take 17 g by mouth as needed for mild constipation, moderate constipation or severe constipation.) 500 g 3   acetaminophen  (TYLENOL ) 500 MG tablet Take 1 tablet (500 mg total) by mouth every 6 (six) hours as needed. (Patient not taking: Reported on 09/01/2023) 30 tablet 0   Apoaequorin (PREVAGEN PO) Take 1 capsule by mouth daily. OTC (Patient not taking: Reported on 09/01/2023)     colchicine  0.6 MG tablet Take two tablets prn gout attack. Then take another one in 1-2 hrs. Do not repeat for 3 days. (Patient not taking: Reported on 08/15/2023) 18 tablet 1   spironolactone  (ALDACTONE ) 50 MG tablet TAKE 1 TABLET BY MOUTH DAILY (Patient not taking: Reported on 09/01/2023) 100 tablet 2   triamcinolone  cream (KENALOG ) 0.1 % Apply 1 Application topically 2 (two) times daily. (Patient not taking: Reported on 05/02/2023) 30 g 0   triamcinolone  ointment (KENALOG ) 0.5 % Apply 1 Application topically 4 (four) times daily. On dry lips prn (Patient not taking: Reported  on 05/02/2023) 30 g 0   No facility-administered medications prior to visit.    ROS: Review of Systems  Constitutional:  Negative for activity change, appetite change, chills, fatigue and unexpected weight change.  HENT:  Negative for congestion, mouth sores and sinus pressure.   Eyes:  Negative for visual disturbance.  Respiratory:  Negative for cough and chest tightness.   Gastrointestinal:  Negative for abdominal pain and nausea.  Genitourinary:  Negative for difficulty urinating, frequency and vaginal pain.  Musculoskeletal:  Negative for back pain and gait problem.  Skin:  Negative for pallor and rash.  Neurological:  Negative for dizziness, tremors, weakness, numbness and headaches.  Psychiatric/Behavioral:  Negative for confusion and sleep disturbance.     Objective:  BP 128/72   Pulse (!) 59   Temp 98.8 F (37.1 C)   Ht 5' 6 (1.676 m)   Wt 222 lb 3.2 oz (100.8 kg)   SpO2 99%   BMI 35.86 kg/m   BP Readings from Last 3 Encounters:  09/01/23 128/72  08/15/23 130/70  05/02/23 128/68    Wt Readings from Last 3 Encounters:  09/01/23 222 lb 3.2 oz (100.8 kg)  08/15/23 220 lb 8 oz (100 kg)  05/02/23 216 lb (98 kg)    Physical Exam Constitutional:      General: She is not in acute distress.    Appearance: She is well-developed.  HENT:     Head: Normocephalic.     Right Ear:  External ear normal.     Left Ear: External ear normal.     Nose: Nose normal.  Eyes:     General:        Right eye: No discharge.        Left eye: No discharge.     Conjunctiva/sclera: Conjunctivae normal.     Pupils: Pupils are equal, round, and reactive to light.  Neck:     Thyroid : No thyromegaly.     Vascular: No JVD.     Trachea: No tracheal deviation.  Cardiovascular:     Rate and Rhythm: Normal rate and regular rhythm.     Heart sounds: Normal heart sounds.  Pulmonary:     Effort: No respiratory distress.     Breath sounds: No stridor. No wheezing.  Abdominal:     General:  Bowel sounds are normal. There is no distension.     Palpations: Abdomen is soft. There is no mass.     Tenderness: There is no abdominal tenderness. There is no guarding or rebound.  Musculoskeletal:        General: No tenderness.     Cervical back: Normal range of motion and neck supple. No rigidity.  Lymphadenopathy:     Cervical: No cervical adenopathy.  Skin:    Findings: No erythema or rash.  Neurological:     Cranial Nerves: No cranial nerve deficit.     Motor: No abnormal muscle tone.     Coordination: Coordination normal.     Deep Tendon Reflexes: Reflexes normal.  Psychiatric:        Behavior: Behavior normal.        Thought Content: Thought content normal.        Judgment: Judgment normal.     Lab Results  Component Value Date   WBC 2.8 (L) 03/01/2023   HGB 13.8 03/01/2023   HCT 41.2 03/01/2023   PLT 78.0 (L) 03/01/2023   GLUCOSE 92 03/01/2023   CHOL 148 04/14/2022   TRIG 73 04/14/2022   HDL 60 04/14/2022   LDLDIRECT 139.3 05/31/2012   LDLCALC 73 04/14/2022   ALT 12 03/01/2023   AST 14 03/01/2023   NA 140 03/01/2023   K 4.6 03/01/2023   CL 104 03/01/2023   CREATININE 1.32 (H) 03/01/2023   BUN 18 03/01/2023   CO2 27 03/01/2023   TSH 1.58 08/26/2022   INR 1.1 04/14/2022   HGBA1C 6.0 08/26/2022    DG Foot Complete Left Result Date: 04/29/2023 CLINICAL DATA:  Pain EXAM: LEFT FOOT - COMPLETE 3+ VIEW COMPARISON:  None Available. FINDINGS: Soft tissue swelling along the dorsum of the foot. No acute bony abnormality. Specifically, no fracture, subluxation, or dislocation. Joint spaces maintained. IMPRESSION: No acute bony abnormality. Electronically Signed   By: Franky Crease M.D.   On: 04/29/2023 01:43    Assessment & Plan:   Problem List Items Addressed This Visit     B12 deficiency - Primary   On B complex       Dyslipidemia   Cont w/Lovastatin       Relevant Orders   Comprehensive metabolic panel   TSH   Lipid panel   Essential hypertension     Continue with Metoprolol , Spironolactone  Labs today      Gout   Colchicine  prn Medrol  pack prn Allopurinol  intolerant      Relevant Orders   Uric acid      Meds ordered this encounter  Medications   methylPREDNISolone  (MEDROL  DOSEPAK) 4 MG TBPK tablet  Sig: As directed for gout attack    Dispense:  21 tablet    Refill:  0   colchicine  0.6 MG tablet    Sig: Take two tablets prn gout attack. Then take another one in 1-2 hrs. Do not repeat for 3 days.    Dispense:  18 tablet    Refill:  1      Follow-up: Return in about 6 months (around 02/29/2024) for Wellness Exam.  Marolyn Noel, MD

## 2023-09-01 NOTE — Assessment & Plan Note (Signed)
On B complex 

## 2023-09-01 NOTE — Assessment & Plan Note (Signed)
Cont w/Lovastatin 

## 2023-09-05 ENCOUNTER — Other Ambulatory Visit: Payer: Self-pay | Admitting: Internal Medicine

## 2023-11-17 DIAGNOSIS — H04123 Dry eye syndrome of bilateral lacrimal glands: Secondary | ICD-10-CM | POA: Diagnosis not present

## 2023-11-17 DIAGNOSIS — H02831 Dermatochalasis of right upper eyelid: Secondary | ICD-10-CM | POA: Diagnosis not present

## 2023-11-17 DIAGNOSIS — H02834 Dermatochalasis of left upper eyelid: Secondary | ICD-10-CM | POA: Diagnosis not present

## 2024-01-07 ENCOUNTER — Other Ambulatory Visit: Payer: Self-pay | Admitting: Internal Medicine

## 2024-02-02 ENCOUNTER — Ambulatory Visit (INDEPENDENT_AMBULATORY_CARE_PROVIDER_SITE_OTHER): Payer: Medicare Other | Admitting: Podiatry

## 2024-02-02 DIAGNOSIS — G5793 Unspecified mononeuropathy of bilateral lower limbs: Secondary | ICD-10-CM | POA: Diagnosis not present

## 2024-02-02 NOTE — Progress Notes (Signed)
 She presents today with her husband for follow-up of her neuropathy.  She states that everything is going fine and on taking the medication and having no pain.  Objective: Vital signs are stable alert oriented x 3.  There is no erythema edema cellulitis drainage or odor pulses are strongly palpable no open lesions.  Assessment: Well-maintained neuropathic symptoms was gabapentin .  Plan: Continue the use of gabapentin  100 mg at nighttime.  Follow-up with me in 6 months

## 2024-02-07 DIAGNOSIS — R350 Frequency of micturition: Secondary | ICD-10-CM | POA: Diagnosis not present

## 2024-02-07 DIAGNOSIS — R8290 Unspecified abnormal findings in urine: Secondary | ICD-10-CM | POA: Diagnosis not present

## 2024-02-29 ENCOUNTER — Encounter: Payer: Self-pay | Admitting: Internal Medicine

## 2024-02-29 ENCOUNTER — Ambulatory Visit: Payer: Medicare Other | Admitting: Internal Medicine

## 2024-02-29 VITALS — BP 118/68 | HR 58 | Temp 98.1°F | Ht 66.0 in | Wt 222.0 lb

## 2024-02-29 DIAGNOSIS — E559 Vitamin D deficiency, unspecified: Secondary | ICD-10-CM

## 2024-02-29 DIAGNOSIS — K219 Gastro-esophageal reflux disease without esophagitis: Secondary | ICD-10-CM | POA: Diagnosis not present

## 2024-02-29 DIAGNOSIS — I10 Essential (primary) hypertension: Secondary | ICD-10-CM

## 2024-02-29 DIAGNOSIS — Z0000 Encounter for general adult medical examination without abnormal findings: Secondary | ICD-10-CM | POA: Diagnosis not present

## 2024-02-29 DIAGNOSIS — E538 Deficiency of other specified B group vitamins: Secondary | ICD-10-CM | POA: Diagnosis not present

## 2024-02-29 LAB — COMPREHENSIVE METABOLIC PANEL WITH GFR
ALT: 10 U/L (ref 0–35)
AST: 19 U/L (ref 0–37)
Albumin: 4.5 g/dL (ref 3.5–5.2)
Alkaline Phosphatase: 54 U/L (ref 39–117)
BUN: 18 mg/dL (ref 6–23)
CO2: 26 meq/L (ref 19–32)
Calcium: 9.7 mg/dL (ref 8.4–10.5)
Chloride: 106 meq/L (ref 96–112)
Creatinine, Ser: 1.29 mg/dL — ABNORMAL HIGH (ref 0.40–1.20)
GFR: 37.13 mL/min — ABNORMAL LOW (ref >60.00)
Glucose, Bld: 90 mg/dL (ref 70–99)
Potassium: 4.5 meq/L (ref 3.5–5.1)
Sodium: 139 meq/L (ref 135–145)
Total Bilirubin: 0.7 mg/dL (ref 0.2–1.2)
Total Protein: 7.3 g/dL (ref 6.0–8.3)

## 2024-02-29 LAB — CBC WITH DIFFERENTIAL/PLATELET
Basophils Absolute: 0 K/uL (ref 0.0–0.1)
Basophils Relative: 0.7 % (ref 0.0–3.0)
Eosinophils Absolute: 0 K/uL (ref 0.0–0.7)
Eosinophils Relative: 0.8 % (ref 0.0–5.0)
HCT: 42.6 % (ref 36.0–46.0)
Hemoglobin: 14.2 g/dL (ref 12.0–15.0)
Lymphocytes Relative: 50.9 % — ABNORMAL HIGH (ref 12.0–46.0)
Lymphs Abs: 1.4 K/uL (ref 0.7–4.0)
MCHC: 33.3 g/dL (ref 30.0–36.0)
MCV: 94 fl (ref 78.0–100.0)
Monocytes Absolute: 0.3 K/uL (ref 0.1–1.0)
Monocytes Relative: 11.1 % (ref 3.0–12.0)
Neutro Abs: 1 K/uL — ABNORMAL LOW (ref 1.4–7.7)
Neutrophils Relative %: 36.5 % — ABNORMAL LOW (ref 43.0–77.0)
Platelets: 76 K/uL — ABNORMAL LOW (ref 150.0–400.0)
RBC: 4.53 Mil/uL (ref 3.87–5.11)
RDW: 14.7 % (ref 11.5–15.5)
WBC: 2.7 K/uL — ABNORMAL LOW (ref 4.0–10.5)

## 2024-02-29 LAB — URINALYSIS, ROUTINE W REFLEX MICROSCOPIC
Bilirubin Urine: NEGATIVE
Hgb urine dipstick: NEGATIVE
Ketones, ur: NEGATIVE
Nitrite: POSITIVE — AB
Specific Gravity, Urine: 1.015 (ref 1.000–1.030)
Total Protein, Urine: NEGATIVE
Urine Glucose: NEGATIVE
Urobilinogen, UA: 0.2 (ref 0.0–1.0)
pH: 6 (ref 5.0–8.0)

## 2024-02-29 LAB — LIPID PANEL
Cholesterol: 162 mg/dL (ref 0–200)
HDL: 65.5 mg/dL (ref >39.00)
LDL Cholesterol: 73 mg/dL (ref 0–99)
NonHDL: 96.37
Total CHOL/HDL Ratio: 2
Triglycerides: 119 mg/dL (ref 0.0–149.0)
VLDL: 23.8 mg/dL (ref 0.0–40.0)

## 2024-02-29 LAB — TSH: TSH: 1.55 u[IU]/mL (ref 0.35–5.50)

## 2024-02-29 LAB — VITAMIN D 25 HYDROXY (VIT D DEFICIENCY, FRACTURES): VITD: 59.08 ng/mL (ref 30.00–100.00)

## 2024-02-29 LAB — VITAMIN B12: Vitamin B-12: 502 pg/mL (ref 211–911)

## 2024-02-29 NOTE — Assessment & Plan Note (Addendum)
  Today patient counseled on age appropriate routine health concerns for screening and prevention, each reviewed and up to date or declined. Immunizations reviewed and up to date or declined. Labs ordered and reviewed. Risk factors for depression reviewed and negative. Hearing function and visual acuity are intact. ADLs screened and addressed as needed. Functional ability and level of safety reviewed and appropriate. Education, counseling and referrals performed based on assessed risks today. Patient provided with a copy of personalized plan for preventive services.  Cologuard option was discussed Eye exam, mammo yearly tDAP is due

## 2024-02-29 NOTE — Assessment & Plan Note (Signed)
On B complex 

## 2024-02-29 NOTE — Progress Notes (Signed)
 Subjective:  Patient ID: Jasmin [name], female    DOB: 1933-11-22  Age: 89 y.o. MRN: [id number]  CC: Annual Exam   HPI Jasmin [name] presents for a well exam F/u on GERD, HTN  Outpatient Medications Prior to Visit  Medication Sig Dispense Refill   colchicine  0.6 MG tablet Take two tablets prn gout attack. Then take another one in 1-2 hrs. Do not repeat for 3 days. 18 tablet 1   famotidine  (PEPCID ) 40 MG tablet TAKE 1 TABLET BY MOUTH DAILY 100 tablet 2   gabapentin  (NEURONTIN ) 100 MG capsule Take 1 capsule (100 mg total) by mouth at bedtime. 90 capsule 3   lovastatin  (MEVACOR ) 40 MG tablet TAKE 1 TABLET BY MOUTH AT  BEDTIME 100 tablet 2   methylPREDNISolone  (MEDROL  DOSEPAK) 4 MG TBPK tablet As directed for gout attack 21 tablet 0   metoprolol  tartrate (LOPRESSOR ) 50 MG tablet TAKE 1 TABLET BY MOUTH TWICE  DAILY 200 tablet 2   mupirocin  ointment (BACTROBAN ) 2 % Apply 1 Application topically 2 (two) times daily. 22 g 0   polyethylene glycol powder (GLYCOLAX /MIRALAX ) powder Take 17 g by mouth 2 (two) times daily as needed for mild constipation, moderate constipation or severe constipation. (Patient taking differently: Take 17 g by mouth as needed for mild constipation, moderate constipation or severe constipation.) 500 g 3   spironolactone  (ALDACTONE ) 50 MG tablet TAKE 1 TABLET BY MOUTH DAILY 100 tablet 2   acetaminophen  (TYLENOL ) 500 MG tablet Take 1 tablet (500 mg total) by mouth every 6 (six) hours as needed. (Patient not taking: Reported on 02/29/2024) 30 tablet 0   No facility-administered medications prior to visit.    ROS: Review of Systems  Constitutional:  Positive for fatigue. Negative for activity change, appetite change, chills and unexpected weight change.  HENT:  Negative for congestion, mouth sores and sinus pressure.   Eyes:  Negative for visual disturbance.  Respiratory:  Negative for cough and chest tightness.   Gastrointestinal:  Negative for abdominal pain  and nausea.  Genitourinary:  Negative for difficulty urinating, frequency and vaginal pain.  Musculoskeletal:  Negative for back pain and gait problem.  Skin:  Negative for pallor and rash.  Neurological:  Negative for dizziness, tremors, weakness, numbness and headaches.  Psychiatric/Behavioral:  Negative for confusion and sleep disturbance.     Objective:  BP 118/68   Pulse (!) 58   Temp 98.1 F (36.7 C) (Oral)   Ht 5' 6 (1.676 m)   Wt 222 lb (100.7 kg)   SpO2 97%   BMI 35.83 kg/m   BP Readings from Last 3 Encounters:  02/29/24 118/68  09/01/23 128/72  08/15/23 130/70    Wt Readings from Last 3 Encounters:  02/29/24 222 lb (100.7 kg)  09/01/23 222 lb 3.2 oz (100.8 kg)  08/15/23 220 lb 8 oz (100 kg)    Physical Exam Constitutional:      General: She is not in acute distress.    Appearance: She is well-developed. She is obese.  HENT:     Head: Normocephalic.     Right Ear: External ear normal.     Left Ear: External ear normal.     Nose: Nose normal.  Eyes:     General:        Right eye: No discharge.        Left eye: No discharge.     Conjunctiva/sclera: Conjunctivae normal.     Pupils: Pupils are equal, round, and reactive to  light.  Neck:     Thyroid : No thyromegaly.     Vascular: No JVD.     Trachea: No tracheal deviation.  Cardiovascular:     Rate and Rhythm: Normal rate and regular rhythm.     Heart sounds: Normal heart sounds.  Pulmonary:     Effort: No respiratory distress.     Breath sounds: No stridor. No wheezing.  Abdominal:     General: Bowel sounds are normal. There is no distension.     Palpations: Abdomen is soft. There is no mass.     Tenderness: There is no abdominal tenderness. There is no guarding or rebound.  Musculoskeletal:        General: No tenderness.     Cervical back: Normal range of motion and neck supple. No rigidity.     Right lower leg: No edema.     Left lower leg: No edema.  Lymphadenopathy:     Cervical: No cervical  adenopathy.  Skin:    Findings: No erythema or rash.  Neurological:     Mental Status: She is oriented to person, place, and time.     Cranial Nerves: No cranial nerve deficit.     Motor: No abnormal muscle tone.     Coordination: Coordination normal.     Deep Tendon Reflexes: Reflexes normal.  Psychiatric:        Behavior: Behavior normal.        Thought Content: Thought content normal.        Judgment: Judgment normal.     Lab Results  Component Value Date   WBC 2.8 (L) 03/01/2023   HGB 13.8 03/01/2023   HCT 41.2 03/01/2023   PLT 78.0 (L) 03/01/2023   GLUCOSE 91 09/01/2023   CHOL 177 09/01/2023   TRIG 123.0 09/01/2023   HDL 60.30 09/01/2023   LDLDIRECT 139.3 05/31/2012   LDLCALC 93 09/01/2023   ALT 12 09/01/2023   AST 16 09/01/2023   NA 141 09/01/2023   K 4.6 09/01/2023   CL 105 09/01/2023   CREATININE 1.17 09/01/2023   BUN 19 09/01/2023   CO2 28 09/01/2023   TSH 1.70 09/01/2023   INR 1.1 04/14/2022   HGBA1C 6.0 08/26/2022    DG Foot Complete Left Result Date: 04/29/2023 CLINICAL DATA:  Pain EXAM: LEFT FOOT - COMPLETE 3+ VIEW COMPARISON:  None Available. FINDINGS: Soft tissue swelling along the dorsum of the foot. No acute bony abnormality. Specifically, no fracture, subluxation, or dislocation. Joint spaces maintained. IMPRESSION: No acute bony abnormality. Electronically Signed   By: Franky Crease M.D.   On: 04/29/2023 01:43    Assessment & Plan:   Problem List Items Addressed This Visit     B12 deficiency   On B complex       Relevant Orders   Vitamin B12   Vitamin D  deficiency   On Vit D      Relevant Orders   VITAMIN D  25 Hydroxy (Vit-D Deficiency, Fractures)   Essential hypertension    Continue with Metoprolol , Spironolactone  Labs today      Relevant Orders   Comprehensive metabolic panel with GFR   GERD   Zantac  bid      Well adult exam - Primary    Today patient counseled on age appropriate routine health concerns for screening and  prevention, each reviewed and up to date or declined. Immunizations reviewed and up to date or declined. Labs ordered and reviewed. Risk factors for depression reviewed and negative. Hearing function and  visual acuity are intact. ADLs screened and addressed as needed. Functional ability and level of safety reviewed and appropriate. Education, counseling and referrals performed based on assessed risks today. Patient provided with a copy of personalized plan for preventive services.  Cologuard option was discussed Eye exam, mammo yearly tDAP is due       Relevant Orders   TSH   Urinalysis   CBC with Differential/Platelet   Lipid panel   Comprehensive metabolic panel with GFR      No orders of the defined types were placed in this encounter.     Follow-up: No follow-ups on file.  Marolyn Noel, MD

## 2024-02-29 NOTE — Assessment & Plan Note (Signed)
Continue with Metoprolol, Spironolactone Labs today

## 2024-02-29 NOTE — Assessment & Plan Note (Signed)
Zantac bid 

## 2024-02-29 NOTE — Assessment & Plan Note (Signed)
 On Vit D

## 2024-03-02 ENCOUNTER — Ambulatory Visit: Payer: Self-pay | Admitting: Internal Medicine

## 2024-03-02 MED ORDER — NITROFURANTOIN MONOHYD MACRO 100 MG PO CAPS: 100.0000 mg | ORAL_CAPSULE | Freq: Two times a day (BID) | ORAL | 0 refills | Status: AC

## 2024-03-22 ENCOUNTER — Other Ambulatory Visit: Payer: Self-pay

## 2024-03-22 ENCOUNTER — Emergency Department (HOSPITAL_BASED_OUTPATIENT_CLINIC_OR_DEPARTMENT_OTHER)
Admission: EM | Admit: 2024-03-22 | Discharge: 2024-03-22 | Disposition: A | Discharge status: Home / Self Care | Attending: Emergency Medicine | Admitting: Emergency Medicine

## 2024-03-22 ENCOUNTER — Encounter (HOSPITAL_BASED_OUTPATIENT_CLINIC_OR_DEPARTMENT_OTHER): Payer: Self-pay

## 2024-03-22 ENCOUNTER — Emergency Department (HOSPITAL_BASED_OUTPATIENT_CLINIC_OR_DEPARTMENT_OTHER): Admitting: Radiology

## 2024-03-22 ENCOUNTER — Emergency Department (HOSPITAL_BASED_OUTPATIENT_CLINIC_OR_DEPARTMENT_OTHER)

## 2024-03-22 ENCOUNTER — Other Ambulatory Visit (HOSPITAL_BASED_OUTPATIENT_CLINIC_OR_DEPARTMENT_OTHER): Payer: Self-pay

## 2024-03-22 DIAGNOSIS — M79605 Pain in left leg: Secondary | ICD-10-CM | POA: Diagnosis not present

## 2024-03-22 DIAGNOSIS — I10 Essential (primary) hypertension: Secondary | ICD-10-CM | POA: Diagnosis not present

## 2024-03-22 DIAGNOSIS — Z79899 Other long term (current) drug therapy: Secondary | ICD-10-CM | POA: Insufficient documentation

## 2024-03-22 DIAGNOSIS — M1712 Unilateral primary osteoarthritis, left knee: Secondary | ICD-10-CM | POA: Diagnosis not present

## 2024-03-22 DIAGNOSIS — R600 Localized edema: Secondary | ICD-10-CM | POA: Diagnosis not present

## 2024-03-22 DIAGNOSIS — M25562 Pain in left knee: Secondary | ICD-10-CM | POA: Insufficient documentation

## 2024-03-22 MED ORDER — ACETAMINOPHEN 325 MG PO TABS
650.0000 mg | ORAL_TABLET | Freq: Once | ORAL | Status: AC
  Administered 2024-03-22: 650 mg via ORAL
  Filled 2024-03-22: qty 2

## 2024-03-22 MED ORDER — METHYLPREDNISOLONE 4 MG PO TBPK
ORAL_TABLET | ORAL | 0 refills | Status: DC
  Filled 2024-03-22: qty 21, 6d supply, fill #0

## 2024-03-22 NOTE — ED Triage Notes (Signed)
 Patient comes in with left leg and knee pain for about two days. She reports she feels swelling around the knee. She does not report any injury to it. She is able to ambulate but does have a limp. She states it does hurt when she puts weight on it and has not been able to go up the stairs. She took 2 Tylenol  and she states it did help. There is some non-pitting edema noted in the left lower leg in triage.

## 2024-03-22 NOTE — ED Provider Notes (Signed)
 [facility] AT [facility] Provider Note   CSN: [id number] Arrival date & time: 03/22/24  9158     Patient presents with: Leg Pain and Knee Pain   Jasmin [name] is a 89 y.o. female.   Patient here with left knee pain for the last couple days.  Denies any weakness numbness tingling.  Having some pain to the back of the left leg as well.  Denies any chest pain shortness of breath.  Has history of gout.  Has never had pain like this in this area before.  She denies any trauma.  Denies any fever or chills.  Patient does have history of hypertension high cholesterol.  Patient denies any redness swelling.  Nothing makes it worse or better.  She is been able to ambulate.  The history is provided by the patient.       Prior to Admission medications   Medication Sig Start Date End Date Taking? Authorizing Provider  acetaminophen  (TYLENOL ) 500 MG tablet Take 1 tablet (500 mg total) by mouth every 6 (six) hours as needed. Patient not taking: Reported on 02/29/2024 07/25/20   Burky, Natalie B, NP  colchicine  0.6 MG tablet Take two tablets prn gout attack. Then take another one in 1-2 hrs. Do not repeat for 3 days. 09/01/23   Plotnikov, Aleksei V, MD  famotidine  (PEPCID ) 40 MG tablet TAKE 1 TABLET BY MOUTH Jasmin 01/09/24   Plotnikov, Aleksei V, MD  gabapentin  (NEURONTIN ) 100 MG capsule Take 1 capsule (100 mg total) by mouth at bedtime. 05/05/23   Hyatt, Max T, DPM  lovastatin  (MEVACOR ) 40 MG tablet TAKE 1 TABLET BY MOUTH AT  BEDTIME 09/06/23   Plotnikov, Aleksei V, MD  methylPREDNISolone  (MEDROL  DOSEPAK) 4 MG TBPK tablet As directed 03/22/24   Missy Baksh, DO  metoprolol  tartrate (LOPRESSOR ) 50 MG tablet TAKE 1 TABLET BY MOUTH TWICE  Jasmin 09/06/23   Plotnikov, Aleksei V, MD  mupirocin  ointment (BACTROBAN ) 2 % Apply 1 Application topically 2 (two) times Jasmin. 10/22/22   Dreama, [place]  N, FNP  nitrofurantoin , macrocrystal-monohydrate, (MACROBID ) 100 MG capsule Take 1  capsule (100 mg total) by mouth 2 (two) times Jasmin. 03/02/24   Plotnikov, Aleksei V, MD  polyethylene glycol powder (GLYCOLAX /MIRALAX ) powder Take 17 g by mouth 2 (two) times Jasmin as needed for mild constipation, moderate constipation or severe constipation. Patient taking differently: Take 17 g by mouth as needed for mild constipation, moderate constipation or severe constipation. 07/06/18   Plotnikov, Karlynn GAILS, MD  spironolactone  (ALDACTONE ) 50 MG tablet TAKE 1 TABLET BY MOUTH Jasmin 01/09/24   Plotnikov, Aleksei V, MD    Allergies: Allopurinol , Doxycycline , Hctz [hydrochlorothiazide], Omeprazole , Oxybutynin , Sulfonamide derivatives, Clarithromycin, and Penicillins    Review of Systems  Updated Vital Signs BP (!) 141/57 (BP Location: Left Arm)   Pulse 70   Temp 98 F (36.7 C) (Oral)   Resp 18   SpO2 98%   Physical Exam Vitals and nursing note reviewed.  Constitutional:      General: She is not in acute distress.    Appearance: She is well-developed.  HENT:     Head: Normocephalic and atraumatic.     Mouth/Throat:     Mouth: Mucous membranes are moist.  Eyes:     Extraocular Movements: Extraocular movements intact.     Conjunctiva/sclera: Conjunctivae normal.     Pupils: Pupils are equal, round, and reactive to light.  Cardiovascular:     Rate and Rhythm: Normal rate and regular rhythm.  Pulses: Normal pulses.     Heart sounds: Normal heart sounds. No murmur heard. Pulmonary:     Effort: Pulmonary effort is normal. No respiratory distress.     Breath sounds: Normal breath sounds.  Abdominal:     Palpations: Abdomen is soft.     Tenderness: There is no abdominal tenderness.  Musculoskeletal:        General: Tenderness (left knee) present. No swelling.     Cervical back: Neck supple.     Right lower leg: No edema.     Left lower leg: No edema.     Comments: Tenderness to the anterior left knee, tenderness to the calf on the left side  Skin:    General: Skin is warm  and dry.     Capillary Refill: Capillary refill takes less than 2 seconds.  Neurological:     General: No focal deficit present.     Mental Status: She is alert.     Sensory: No sensory deficit.     Motor: No weakness.  Psychiatric:        Mood and Affect: Mood normal.     (all labs ordered are listed, but only abnormal results are displayed) Labs Reviewed - No data to display  EKG: None  Radiology: US  Venous Img Lower  Left (DVT Study) Result Date: 03/22/2024 CLINICAL DATA:  Left lower extremity pain and edema. EXAM: LEFT LOWER EXTREMITY VENOUS DOPPLER ULTRASOUND TECHNIQUE: Gray-scale sonography with graded compression, as well as color Doppler and duplex ultrasound were performed to evaluate the lower extremity deep venous systems from the level of the common femoral vein and including the common femoral, femoral, profunda femoral, popliteal and calf veins including the posterior tibial, peroneal and gastrocnemius veins when visible. The superficial great saphenous vein was also interrogated. Spectral Doppler was utilized to evaluate flow at rest and with distal augmentation maneuvers in the common femoral, femoral and popliteal veins. COMPARISON:  None Available. FINDINGS: Contralateral Common Femoral Vein: Respiratory phasicity is normal and symmetric with the symptomatic side. No evidence of thrombus. Normal compressibility. Common Femoral Vein: No evidence of thrombus. Normal compressibility, respiratory phasicity and response to augmentation. Saphenofemoral Junction: No evidence of thrombus. Normal compressibility and flow on color Doppler imaging. Profunda Femoral Vein: No evidence of thrombus. Normal compressibility and flow on color Doppler imaging. Femoral Vein: No evidence of thrombus. Normal compressibility, respiratory phasicity and response to augmentation. Popliteal Vein: No evidence of thrombus. Normal compressibility, respiratory phasicity and response to augmentation. Calf  Veins: No evidence of thrombus. Normal compressibility and flow on color Doppler imaging. Superficial Great Saphenous Vein: No evidence of thrombus. Normal compressibility. Venous Reflux:  None. Other Findings: No evidence of superficial thrombophlebitis or abnormal fluid collection. IMPRESSION: No evidence of left lower extremity deep venous thrombosis. Electronically Signed   By: Marcey Moan M.D.   On: 03/22/2024 09:53   DG Knee Complete 4 Views Left Result Date: 03/22/2024 CLINICAL DATA:  pain EXAM: LEFT KNEE - COMPLETE 4+ VIEW COMPARISON:  09/03/2018. FINDINGS: No acute fracture or dislocation. No aggressive osseous lesion. There are degenerative changes of the knee joint in the form of mildly reduced tibio-femoral compartment joint space, tibial spiking and tricompartmental osteophytosis. No knee effusion or focal soft tissue swelling. No radiopaque foreign bodies. IMPRESSION: No acute osseous abnormality of the left knee joint. Mild degenerative changes. Electronically Signed   By: Ree Molt M.D.   On: 03/22/2024 09:23     Procedures   Medications Ordered in the ED  acetaminophen  (TYLENOL ) tablet 650 mg (650 mg Oral Given 03/22/24 0858)                                    Medical Decision Making Amount and/or Complexity of Data Reviewed Radiology: ordered.  Risk OTC drugs. Prescription drug management.   Jasmin [name] is here with left knee pain.  History of gout.  History of hypertension.  Normal vitals.  No fever.  Differential diagnosis likely arthritis process/inflammatory process versus less likely DVT and fracture.  Will get x-ray and DVT study.  She is neurovascular neuromuscular intact.  Have no concern for infectious process including septic joint.  She has got good pulses in her lower extremities.  There is no obvious swelling or redness to the leg or knee.  Radiology report DVT study normal.  X-ray of the knee unremarkable.  Overall we will treat for  inflammatory process with Medrol  Dosepak and have her follow-up with orthopedics primary care.  Discharged in good condition.  Understands return precautions.  This chart was dictated using voice recognition software.  Despite best efforts to proofread,  errors can occur which can change the documentation meaning.      Final diagnoses:  Acute pain of left knee    ED Discharge Orders          Ordered    methylPREDNISolone  (MEDROL  DOSEPAK) 4 MG TBPK tablet        03/22/24 0958               Ruthe Cornet, DO 03/22/24 [phone]

## 2024-05-09 ENCOUNTER — Other Ambulatory Visit: Payer: Self-pay | Admitting: Lab

## 2024-05-09 MED ORDER — GABAPENTIN 100 MG PO CAPS: 100.0000 mg | ORAL_CAPSULE | Freq: Every day | ORAL | 3 refills | Status: DC

## 2024-05-20 ENCOUNTER — Other Ambulatory Visit: Payer: Self-pay | Admitting: Internal Medicine

## 2024-08-07 ENCOUNTER — Encounter: Payer: Self-pay | Admitting: Podiatry

## 2024-08-07 ENCOUNTER — Ambulatory Visit: Admitting: Podiatry

## 2024-08-07 DIAGNOSIS — G5793 Unspecified mononeuropathy of bilateral lower limbs: Secondary | ICD-10-CM | POA: Diagnosis not present

## 2024-08-07 MED ORDER — GABAPENTIN 100 MG PO CAPS: 100.0000 mg | ORAL_CAPSULE | Freq: Every day | ORAL | 3 refills | Status: AC

## 2024-08-07 NOTE — Progress Notes (Signed)
 She presents today with her husband for follow-up of her neuropathy.  She states that everything is going fine and on taking the medication and having no pain.  Objective: Vital signs are stable alert oriented x 3.  There is no erythema edema cellulitis drainage or odor pulses are strongly palpable no open lesions.  Assessment: Well-maintained neuropathic symptoms was gabapentin .  Plan: Continue the use of gabapentin  100 mg at nighttime.  Follow-up with me in 12 months

## 2024-08-15 ENCOUNTER — Ambulatory Visit: Payer: Medicare Other

## 2024-08-15 VITALS — BP 122/72 | HR 67 | Ht 65.0 in | Wt 221.8 lb

## 2024-08-15 DIAGNOSIS — Z0000 Encounter for general adult medical examination without abnormal findings: Secondary | ICD-10-CM

## 2024-08-15 NOTE — Progress Notes (Signed)
 "  Chief Complaint  Patient presents with   Medicare Wellness     Subjective:  Please attest and cosign this visit due to patients primary care provider not being in the office at the time the visit was completed.  (Pt of Dr A. Plotnikov)   Jasmin [name] is a 89 y.o. female who presents for a Medicare Annual Wellness Visit.  Visit info / Clinical Intake: Medicare Wellness Visit Type:: Subsequent Annual Wellness Visit Persons participating in visit and providing information:: patient Medicare Wellness Visit Mode:: In-person (required for WTM) Interpreter Needed?: No Pre-visit prep was completed: yes AWV questionnaire completed by patient prior to visit?: no Living arrangements:: lives with spouse/significant other Patient's Overall Health Status Rating: good Typical amount of pain: none Does pain affect daily life?: no Are you currently prescribed opioids?: no  Dietary Habits and Nutritional Risks How many meals a day?: 2 Eats fruit and vegetables daily?: yes Most meals are obtained by: preparing own meals; eating out In the last 2 weeks, have you had any of the following?: none Diabetic:: no  Functional Status Activities of Daily Living (to include ambulation/medication): Independent Ambulation: Independent with device- listed below Home Assistive Devices/Equipment: Eyeglasses Medication Administration: Independent Home Management (perform basic housework or laundry): Independent Manage your own finances?: yes Primary transportation is: driving Concerns about vision?: no *vision screening is required for WTM* Concerns about hearing?: no  Fall Screening Falls in the past year?: 0 Number of falls in past year: 0 Was there an injury with Fall?: 0 Fall Risk Category Calculator: 0 Patient Fall Risk Level: Low Fall Risk  Fall Risk Patient at Risk for Falls Due to: No Fall Risks Fall risk Follow up: Falls evaluation completed; Falls prevention discussed  Home and  Transportation Safety: All rugs have non-skid backing?: N/A, no rugs All stairs or steps have railings?: yes Grab bars in the bathtub or shower?: yes Have non-skid surface in bathtub or shower?: yes Good home lighting?: yes Regular seat belt use?: yes Hospital stays in the last year:: no  Cognitive Assessment Difficulty concentrating, remembering, or making decisions? : no Will 6CIT or Mini Cog be Completed: yes What year is it?: 0 points What month is it?: 0 points Give patient an address phrase to remember (5 components): [address] [place], [place] About what time is it?: 0 points Count backwards from 20 to 1: 0 points Say the months of the year in reverse: 0 points Repeat the address phrase from earlier: 0 points 6 CIT Score: 0 points  Advance Directives (For Healthcare) Does Patient Have a Medical Advance Directive?: Yes Does patient want to make changes to medical advance directive?: Yes (Inpatient - patient requests chaplain consult to change a medical advance directive) Type of Advance Directive: Healthcare Power of [place]; Living will Copy of Healthcare Power of Attorney in Chart?: No - copy requested Copy of Living Will in Chart?: No - copy requested  Reviewed/Updated  Reviewed/Updated: Reviewed All (Medical, Surgical, Family, Medications, Allergies, Care Teams, Patient Goals)    Allergies (verified) Allopurinol , Doxycycline , Hctz [hydrochlorothiazide], Omeprazole , Oxybutynin , Sulfonamide derivatives, Clarithromycin, and Penicillins   Current Medications (verified) Outpatient Encounter Medications as of 08/15/2024  Medication Sig   acetaminophen  (TYLENOL ) 500 MG tablet Take 1 tablet (500 mg total) by mouth every 6 (six) hours as needed.   colchicine  0.6 MG tablet Take two tablets prn gout attack. Then take another one in 1-2 hrs. Do not repeat for 3 days.   famotidine  (PEPCID ) 40 MG tablet  TAKE 1 TABLET BY MOUTH DAILY   gabapentin  (NEURONTIN ) 100 MG capsule Take  1 capsule (100 mg total) by mouth at bedtime.   lovastatin  (MEVACOR ) 40 MG tablet TAKE 1 TABLET BY MOUTH AT  BEDTIME   metoprolol  tartrate (LOPRESSOR ) 50 MG tablet TAKE 1 TABLET BY MOUTH TWICE  DAILY   mupirocin  ointment (BACTROBAN ) 2 % Apply 1 Application topically 2 (two) times daily.   nitrofurantoin , macrocrystal-monohydrate, (MACROBID ) 100 MG capsule Take 1 capsule (100 mg total) by mouth 2 (two) times daily.   polyethylene glycol powder (GLYCOLAX /MIRALAX ) powder Take 17 g by mouth 2 (two) times daily as needed for mild constipation, moderate constipation or severe constipation. (Patient taking differently: Take 17 g by mouth as needed for mild constipation, moderate constipation or severe constipation.)   spironolactone  (ALDACTONE ) 50 MG tablet TAKE 1 TABLET BY MOUTH DAILY   methylPREDNISolone  (MEDROL  DOSEPAK) 4 MG TBPK tablet Take 6 tablets by mouth on day 1, 5 tabs on day 2, 4 tabs on day 3, 3 tabs on day 4, 2 tabs on day 5, 1 tab on day 6. Then stop. (Patient not taking: Reported on 08/15/2024)   No facility-administered encounter medications on file as of 08/15/2024.    History: Past Medical History:  Diagnosis Date   Contact dermatitis and other eczema, due to unspecified cause    Diverticulosis of colon    Esophageal stricture    GERD with stricture    Dr Abran   Gout    Hemorrhoids    Hiatal hernia    Hyperlipidemia    Hypertension    Onychomycosis    Personal history of urinary calculi    Polycystic kidney    Tubulovillous adenoma polyp of colon    Unspecified vitamin D  deficiency    Vitamin B12 deficiency 2009   Borderline   Past Surgical History:  Procedure Laterality Date   CATARACT EXTRACTION Bilateral 08/24/2015   COLONOSCOPY     DILATION AND CURETTAGE OF UTERUS  08/24/1975   TOE SURGERY  08/23/2010   Right foot    TONSILLECTOMY     as child   Family History  Problem Relation Age of Onset   Hypertension Father    Colon cancer Neg Hx    Esophageal  cancer Neg Hx    Rectal cancer Neg Hx    Stomach cancer Neg Hx    Social History   Occupational History   Occupation: RETIRED  Tobacco Use   Smoking status: Never   Smokeless tobacco: Never  Vaping Use   Vaping status: Never Used  Substance and Sexual Activity   Alcohol use: No    Alcohol/week: 0.0 standard drinks of alcohol   Drug use: No   Sexual activity: Not Currently   Tobacco Counseling Counseling given: No  SDOH Screenings   Food Insecurity: No Food Insecurity (08/15/2024)  Housing: Unknown (08/15/2024)  Transportation Needs: No Transportation Needs (08/15/2024)  Utilities: Not At Risk (08/15/2024)  Alcohol Screen: Low Risk (08/15/2023)  Depression (PHQ2-9): Low Risk (08/15/2024)  Financial Resource Strain: Low Risk (08/15/2023)  Physical Activity: Insufficiently Active (08/15/2024)  Social Connections: Socially Integrated (08/15/2024)  Stress: No Stress Concern Present (08/15/2024)  Tobacco Use: Low Risk (08/15/2024)  Health Literacy: Adequate Health Literacy (08/15/2024)   See flowsheets for full screening details  Depression Screen PHQ 2 & 9 Depression Scale- Over the past 2 weeks, how often have you been bothered by any of the following problems? Little interest or pleasure in doing things: 0 Feeling down,  depressed, or hopeless (PHQ Adolescent also includes...irritable): 0 PHQ-2 Total Score: 0 Trouble falling or staying asleep, or sleeping too much: 0 Feeling tired or having little energy: 0 Poor appetite or overeating (PHQ Adolescent also includes...weight loss): 0 Feeling bad about yourself - or that you are a failure or have let yourself or your family down: 0 Trouble concentrating on things, such as reading the newspaper or watching television (PHQ Adolescent also includes...like school work): 0 Moving or speaking so slowly that other people could have noticed. Or the opposite - being so fidgety or restless that you have been moving around a lot more  than usual: 0 Thoughts that you would be better off dead, or of hurting yourself in some way: 0 PHQ-9 Total Score: 0 If you checked off any problems, how difficult have these problems made it for you to do your work, take care of things at home, or get along with other people?: Not difficult at all  Depression Treatment Depression Interventions/Treatment : EYV7-0 Score <4 Follow-up Not Indicated     Goals Addressed               This Visit's Progress     Patient Stated (pt-stated)        Patient stated he plans to continue exercising 3 days a week at the [facility]             Objective:    Today's Vitals   08/15/24 0906  BP: 122/72  Pulse: 67  Weight: 221 lb 12.8 oz (100.6 kg)  Height: 5' 5 (1.651 m)   Body mass index is 36.91 kg/m.  Hearing/Vision screen Hearing Screening - Comments:: Denies hearing difficulties   Vision Screening - Comments:: Wears rx glasses - up to date with routine eye exams with Charlie Glendia Gaudy Immunizations and Health Maintenance Health Maintenance  Topic Date Due   DTaP/Tdap/Td (2 - Tdap) 03/03/2023   COVID-19 Vaccine (7 - 2025-26 season) 11/29/2024   Medicare Annual Wellness (AWV)  08/15/2025   Pneumococcal Vaccine: 50+ Years  Completed   Influenza Vaccine  Completed   Bone Density Scan  Completed   Zoster Vaccines- Shingrix  Completed   Meningococcal B Vaccine  Aged Out   Colonoscopy  Discontinued        Assessment/Plan:  This is a routine wellness examination for Jasmin [name].  Patient Care Team: Plotnikov, Karlynn GAILS, MD as PCP - General Abran Norleen SAILOR, MD (Gastroenterology) Marilynn Nest, DO as Consulting Physician (Obstetrics and Gynecology) Jerri Kay HERO, MD as Attending Physician (Orthopedic Surgery) Gaudy Charlie Glendia, MD as Consulting Physician (Ophthalmology)  I have personally reviewed and noted the following in the patients chart:   Medical and social history Use of alcohol, tobacco or illicit drugs  Current  medications and supplements including opioid prescriptions. Functional ability and status Nutritional status Physical activity Advanced directives List of other physicians Hospitalizations, surgeries, and ER visits in previous 12 months Vitals Screenings to include cognitive, depression, and falls Referrals and appointments  No orders of the defined types were placed in this encounter.  In addition, I have reviewed and discussed with patient certain preventive protocols, quality metrics, and best practice recommendations. A written personalized care plan for preventive services as well as general preventive health recommendations were provided to patient.   Verdie HERO Saba, CMA   08/15/2024   Return in 1 year (on 08/15/2025).  After Visit Summary: (In Person-Declined) Patient declined AVS at this time.  Nurse Notes: scheduled 2026 AWV/CPE appts "

## 2024-08-15 NOTE — Patient Instructions (Addendum)
 Ms. Colbaugh,  Thank you for taking the time for your Medicare Wellness Visit. I appreciate your continued commitment to your health goals. Please review the care plan we discussed, and feel free to reach out if I can assist you further.  Please note that Annual Wellness Visits do not include a physical exam. Some assessments may be limited, especially if the visit was conducted virtually. If needed, we may recommend an in-person follow-up with your provider.  Ongoing Care Seeing your primary care provider every 3 to 6 months helps us  monitor your health and provide consistent, personalized care.   Referrals If a referral was made during today's visit and you haven't received any updates within two weeks, please contact the referred provider directly to check on the status.  Recommended Screenings:  Health Maintenance  Topic Date Due   DTaP/Tdap/Td vaccine (2 - Tdap) 03/03/2023   COVID-19 Vaccine (7 - 2025-26 season) 11/29/2024   Medicare Annual Wellness Visit  08/15/2025   Pneumococcal Vaccine for age over 24  Completed   Flu Shot  Completed   Osteoporosis screening with Bone Density Scan  Completed   Zoster (Shingles) Vaccine  Completed   Meningitis B Vaccine  Aged Out   Colon Cancer Screening  Discontinued       08/15/2024    9:12 AM  Advanced Directives  Does Patient Have a Medical Advance Directive? Yes  Type of Estate Agent of [place];Living will  Does patient want to make changes to medical advance directive? Yes (Inpatient - patient requests chaplain consult to change a medical advance directive)  Copy of Healthcare Power of Attorney in Chart? No - copy requested    Vision: Annual vision screenings are recommended for early detection of glaucoma, cataracts, and diabetic retinopathy. These exams can also reveal signs of chronic conditions such as diabetes and high blood pressure.  Dental: Annual dental screenings help detect early signs of oral  cancer, gum disease, and other conditions linked to overall health, including heart disease and diabetes.

## 2024-09-04 ENCOUNTER — Encounter: Payer: Self-pay | Admitting: Internal Medicine

## 2024-09-04 ENCOUNTER — Ambulatory Visit: Admitting: Internal Medicine

## 2024-09-04 VITALS — BP 110/72 | HR 59 | Ht 65.0 in | Wt 221.2 lb

## 2024-09-04 DIAGNOSIS — I10 Essential (primary) hypertension: Secondary | ICD-10-CM

## 2024-09-04 DIAGNOSIS — M1000 Idiopathic gout, unspecified site: Secondary | ICD-10-CM

## 2024-09-04 DIAGNOSIS — E559 Vitamin D deficiency, unspecified: Secondary | ICD-10-CM | POA: Diagnosis not present

## 2024-09-04 DIAGNOSIS — E785 Hyperlipidemia, unspecified: Secondary | ICD-10-CM | POA: Diagnosis not present

## 2024-09-04 DIAGNOSIS — K219 Gastro-esophageal reflux disease without esophagitis: Secondary | ICD-10-CM

## 2024-09-04 DIAGNOSIS — E538 Deficiency of other specified B group vitamins: Secondary | ICD-10-CM | POA: Diagnosis not present

## 2024-09-04 LAB — COMPREHENSIVE METABOLIC PANEL WITH GFR
ALT: 13 U/L (ref 3–35)
AST: 15 U/L (ref 5–37)
Albumin: 4.3 g/dL (ref 3.5–5.2)
Alkaline Phosphatase: 50 U/L (ref 39–117)
BUN: 18 mg/dL (ref 6–23)
CO2: 29 meq/L (ref 19–32)
Calcium: 9.7 mg/dL (ref 8.4–10.5)
Chloride: 103 meq/L (ref 96–112)
Creatinine, Ser: 1.21 mg/dL — ABNORMAL HIGH (ref 0.40–1.20)
GFR: 39.95 mL/min — ABNORMAL LOW
Glucose, Bld: 92 mg/dL (ref 70–99)
Potassium: 4.4 meq/L (ref 3.5–5.1)
Sodium: 139 meq/L (ref 135–145)
Total Bilirubin: 0.6 mg/dL (ref 0.2–1.2)
Total Protein: 7.3 g/dL (ref 6.0–8.3)

## 2024-09-04 LAB — URIC ACID: Uric Acid, Serum: 5.7 mg/dL (ref 2.4–7.0)

## 2024-09-04 MED ORDER — COLCHICINE 0.6 MG PO TABS: ORAL_TABLET | ORAL | 1 refills | Status: AC

## 2024-09-04 MED ORDER — METHYLPREDNISOLONE 4 MG PO TBPK: ORAL_TABLET | ORAL | 0 refills | Status: AC

## 2024-09-04 NOTE — Assessment & Plan Note (Signed)
 On Vit D

## 2024-09-04 NOTE — Assessment & Plan Note (Signed)
Cont w/Lovastatin 

## 2024-09-04 NOTE — Assessment & Plan Note (Signed)
 Colchicine  prn Medrol  pack prn Allopurinol  intolerant Labs - uric acid

## 2024-09-04 NOTE — Assessment & Plan Note (Signed)
 On B complex

## 2024-09-04 NOTE — Progress Notes (Signed)
 "  Subjective:  Patient ID: Jasmin [name], female    DOB: 12-23-1933  Age: 89 y.o. MRN: [id number]  CC: Medical Management of Chronic Issues (6 Month follow up)   HPI [name] presents for gout, HTN, GERD  Outpatient Medications Prior to Visit  Medication Sig Dispense Refill   acetaminophen  (TYLENOL ) 500 MG tablet Take 1 tablet (500 mg total) by mouth every 6 (six) hours as needed. 30 tablet 0   famotidine  (PEPCID ) 40 MG tablet TAKE 1 TABLET BY MOUTH DAILY 100 tablet 2   gabapentin  (NEURONTIN ) 100 MG capsule Take 1 capsule (100 mg total) by mouth at bedtime. 90 capsule 3   lovastatin  (MEVACOR ) 40 MG tablet TAKE 1 TABLET BY MOUTH AT  BEDTIME 100 tablet 2   metoprolol  tartrate (LOPRESSOR ) 50 MG tablet TAKE 1 TABLET BY MOUTH TWICE  DAILY 200 tablet 2   mupirocin  ointment (BACTROBAN ) 2 % Apply 1 Application topically 2 (two) times daily. 22 g 0   nitrofurantoin , macrocrystal-monohydrate, (MACROBID ) 100 MG capsule Take 1 capsule (100 mg total) by mouth 2 (two) times daily. 14 capsule 0   polyethylene glycol powder (GLYCOLAX /MIRALAX ) powder Take 17 g by mouth 2 (two) times daily as needed for mild constipation, moderate constipation or severe constipation. (Patient taking differently: Take 17 g by mouth as needed for mild constipation, moderate constipation or severe constipation.) 500 g 3   spironolactone  (ALDACTONE ) 50 MG tablet TAKE 1 TABLET BY MOUTH DAILY 100 tablet 2   colchicine  0.6 MG tablet Take two tablets prn gout attack. Then take another one in 1-2 hrs. Do not repeat for 3 days. 18 tablet 1   methylPREDNISolone  (MEDROL  DOSEPAK) 4 MG TBPK tablet Take 6 tablets by mouth on day 1, 5 tabs on day 2, 4 tabs on day 3, 3 tabs on day 4, 2 tabs on day 5, 1 tab on day 6. Then stop. (Patient not taking: Reported on 08/15/2024) 21 tablet 0   No facility-administered medications prior to visit.    ROS: Review of Systems  Constitutional:  Negative for activity change, appetite  change, chills, fatigue and unexpected weight change.  HENT:  Negative for congestion, mouth sores and sinus pressure.   Eyes:  Negative for visual disturbance.  Respiratory:  Negative for cough and chest tightness.   Gastrointestinal:  Negative for abdominal pain and nausea.  Genitourinary:  Negative for difficulty urinating, frequency and vaginal pain.  Musculoskeletal:  Positive for arthralgias. Negative for back pain and gait problem.  Skin:  Negative for pallor and rash.  Neurological:  Negative for dizziness, tremors, weakness, numbness and headaches.  Psychiatric/Behavioral:  Negative for confusion and sleep disturbance.     Objective:  BP 110/72   Pulse (!) 59   Ht 5' 5 (1.651 m)   Wt 221 lb 3.2 oz (100.3 kg)   SpO2 98%   BMI 36.81 kg/m   BP Readings from Last 3 Encounters:  09/04/24 110/72  08/15/24 122/72  03/22/24 (!) 141/57    Wt Readings from Last 3 Encounters:  09/04/24 221 lb 3.2 oz (100.3 kg)  08/15/24 221 lb 12.8 oz (100.6 kg)  02/29/24 222 lb (100.7 kg)    Physical Exam Constitutional:      General: She is not in acute distress.    Appearance: She is well-developed. She is obese. She is not ill-appearing.  HENT:     Head: Normocephalic.     Right Ear: External ear normal.     Left Ear: External  ear normal.     Nose: Nose normal.  Eyes:     General:        Right eye: No discharge.        Left eye: No discharge.     Conjunctiva/sclera: Conjunctivae normal.     Pupils: Pupils are equal, round, and reactive to light.  Neck:     Thyroid : No thyromegaly.     Vascular: No JVD.     Trachea: No tracheal deviation.  Cardiovascular:     Rate and Rhythm: Normal rate and regular rhythm.     Heart sounds: Normal heart sounds.  Pulmonary:     Effort: No respiratory distress.     Breath sounds: No stridor. No wheezing.  Abdominal:     General: Bowel sounds are normal. There is no distension.     Palpations: Abdomen is soft. There is no mass.      Tenderness: There is no abdominal tenderness. There is no guarding or rebound.  Musculoskeletal:        General: No tenderness.     Cervical back: Normal range of motion and neck supple. No rigidity.  Lymphadenopathy:     Cervical: No cervical adenopathy.  Skin:    Findings: No erythema or rash.  Neurological:     Cranial Nerves: No cranial nerve deficit.     Motor: No abnormal muscle tone.     Coordination: Coordination normal.     Deep Tendon Reflexes: Reflexes normal.  Psychiatric:        Behavior: Behavior normal.        Thought Content: Thought content normal.        Judgment: Judgment normal.     Lab Results  Component Value Date   WBC 2.7 (L) 02/29/2024   HGB 14.2 02/29/2024   HCT 42.6 02/29/2024   PLT 76.0 (L) 02/29/2024   GLUCOSE 90 02/29/2024   CHOL 162 02/29/2024   TRIG 119.0 02/29/2024   HDL 65.50 02/29/2024   LDLDIRECT 139.3 05/31/2012   LDLCALC 73 02/29/2024   ALT 10 02/29/2024   AST 19 02/29/2024   NA 139 02/29/2024   K 4.5 Slightly Hemolyze... 02/29/2024   CL 106 02/29/2024   CREATININE 1.29 (H) 02/29/2024   BUN 18 02/29/2024   CO2 26 02/29/2024   TSH 1.55 02/29/2024   INR 1.1 04/14/2022   HGBA1C 6.0 08/26/2022    US  Venous Img Lower  Left (DVT Study) Result Date: 03/22/2024 CLINICAL DATA:  Left lower extremity pain and edema. EXAM: LEFT LOWER EXTREMITY VENOUS DOPPLER ULTRASOUND TECHNIQUE: Gray-scale sonography with graded compression, as well as color Doppler and duplex ultrasound were performed to evaluate the lower extremity deep venous systems from the level of the common femoral vein and including the common femoral, femoral, profunda femoral, popliteal and calf veins including the posterior tibial, peroneal and gastrocnemius veins when visible. The superficial great saphenous vein was also interrogated. Spectral Doppler was utilized to evaluate flow at rest and with distal augmentation maneuvers in the common femoral, femoral and popliteal veins.  COMPARISON:  None Available. FINDINGS: Contralateral Common Femoral Vein: Respiratory phasicity is normal and symmetric with the symptomatic side. No evidence of thrombus. Normal compressibility. Common Femoral Vein: No evidence of thrombus. Normal compressibility, respiratory phasicity and response to augmentation. Saphenofemoral Junction: No evidence of thrombus. Normal compressibility and flow on color Doppler imaging. Profunda Femoral Vein: No evidence of thrombus. Normal compressibility and flow on color Doppler imaging. Femoral Vein: No evidence of thrombus. Normal compressibility, respiratory  phasicity and response to augmentation. Popliteal Vein: No evidence of thrombus. Normal compressibility, respiratory phasicity and response to augmentation. Calf Veins: No evidence of thrombus. Normal compressibility and flow on color Doppler imaging. Superficial Great Saphenous Vein: No evidence of thrombus. Normal compressibility. Venous Reflux:  None. Other Findings: No evidence of superficial thrombophlebitis or abnormal fluid collection. IMPRESSION: No evidence of left lower extremity deep venous thrombosis. Electronically Signed   By: Marcey Moan M.D.   On: 03/22/2024 09:53   DG Knee Complete 4 Views Left Result Date: 03/22/2024 CLINICAL DATA:  pain EXAM: LEFT KNEE - COMPLETE 4+ VIEW COMPARISON:  09/03/2018. FINDINGS: No acute fracture or dislocation. No aggressive osseous lesion. There are degenerative changes of the knee joint in the form of mildly reduced tibio-femoral compartment joint space, tibial spiking and tricompartmental osteophytosis. No knee effusion or focal soft tissue swelling. No radiopaque foreign bodies. IMPRESSION: No acute osseous abnormality of the left knee joint. Mild degenerative changes. Electronically Signed   By: Ree Molt M.D.   On: 03/22/2024 09:23    Assessment & Plan:   Problem List Items Addressed This Visit     B12 deficiency - Primary   On B complex        Vitamin D  deficiency   On Vit D      Dyslipidemia   Cont w/Lovastatin       Essential hypertension    Continue with Metoprolol , Spironolactone  Labs today      Relevant Orders   Comprehensive metabolic panel with GFR   Uric acid   GERD   On Pepcid  40 mg qd      Gout   Colchicine  prn Medrol  pack prn Allopurinol  intolerant Labs - uric acid         Meds ordered this encounter  Medications   colchicine  0.6 MG tablet    Sig: Take two tablets prn gout attack. Then take another one in 1-2 hrs. Do not repeat for 3 days.    Dispense:  18 tablet    Refill:  1   methylPREDNISolone  (MEDROL  DOSEPAK) 4 MG TBPK tablet    Sig: Take 6 tablets by mouth on day 1, 5 tabs on day 2, 4 tabs on day 3, 3 tabs on day 4, 2 tabs on day 5, 1 tab on day 6. Then stop.    Dispense:  21 tablet    Refill:  0      Follow-up: Return in about 6 months (around 03/04/2025) for a follow-up visit.  Marolyn Noel, MD "

## 2024-09-04 NOTE — Assessment & Plan Note (Signed)
Continue with Metoprolol, Spironolactone Labs today

## 2024-09-04 NOTE — Assessment & Plan Note (Signed)
 On Pepcid  40 mg qd

## 2024-09-09 ENCOUNTER — Ambulatory Visit: Payer: Self-pay | Admitting: Internal Medicine

## 2025-03-04 ENCOUNTER — Ambulatory Visit: Admitting: Internal Medicine

## 2025-08-08 ENCOUNTER — Ambulatory Visit: Admitting: Podiatry

## 2025-08-22 ENCOUNTER — Encounter: Admitting: Internal Medicine

## 2025-08-22 ENCOUNTER — Ambulatory Visit
